# Patient Record
Sex: Female | Born: 1970 | Race: White | Hispanic: No | Marital: Single | Smoking: Never smoker
Health system: Southern US, Community
[De-identification: ages and names within clinical notes are randomized; demographics above are authoritative.]

## PROBLEM LIST (undated history)

## (undated) DIAGNOSIS — R87619 Unspecified abnormal cytological findings in specimens from cervix uteri: Secondary | ICD-10-CM

## (undated) DIAGNOSIS — L409 Psoriasis, unspecified: Secondary | ICD-10-CM

## (undated) DIAGNOSIS — T7840XA Allergy, unspecified, initial encounter: Secondary | ICD-10-CM

## (undated) DIAGNOSIS — A64 Unspecified sexually transmitted disease: Secondary | ICD-10-CM

## (undated) DIAGNOSIS — M549 Dorsalgia, unspecified: Secondary | ICD-10-CM

## (undated) DIAGNOSIS — M940 Chondrocostal junction syndrome [Tietze]: Secondary | ICD-10-CM

## (undated) DIAGNOSIS — D649 Anemia, unspecified: Secondary | ICD-10-CM

## (undated) DIAGNOSIS — F32A Depression, unspecified: Secondary | ICD-10-CM

## (undated) DIAGNOSIS — G43909 Migraine, unspecified, not intractable, without status migrainosus: Secondary | ICD-10-CM

## (undated) DIAGNOSIS — K219 Gastro-esophageal reflux disease without esophagitis: Secondary | ICD-10-CM

## (undated) DIAGNOSIS — F419 Anxiety disorder, unspecified: Secondary | ICD-10-CM

## (undated) DIAGNOSIS — M1990 Unspecified osteoarthritis, unspecified site: Secondary | ICD-10-CM

## (undated) DIAGNOSIS — IMO0002 Reserved for concepts with insufficient information to code with codable children: Secondary | ICD-10-CM

## (undated) HISTORY — DX: Depression, unspecified: F32.A

## (undated) HISTORY — DX: Unspecified abnormal cytological findings in specimens from cervix uteri: R87.619

## (undated) HISTORY — DX: Unspecified sexually transmitted disease: A64

## (undated) HISTORY — DX: Allergy, unspecified, initial encounter: T78.40XA

## (undated) HISTORY — DX: Unspecified osteoarthritis, unspecified site: M19.90

## (undated) HISTORY — DX: Dorsalgia, unspecified: M54.9

## (undated) HISTORY — PX: ADENOIDECTOMY: SUR15

## (undated) HISTORY — DX: Anemia, unspecified: D64.9

## (undated) HISTORY — DX: Migraine, unspecified, not intractable, without status migrainosus: G43.909

## (undated) HISTORY — DX: Chondrocostal junction syndrome (tietze): M94.0

## (undated) HISTORY — DX: Gastro-esophageal reflux disease without esophagitis: K21.9

## (undated) HISTORY — DX: Anxiety disorder, unspecified: F41.9

## (undated) HISTORY — DX: Reserved for concepts with insufficient information to code with codable children: IMO0002

## (undated) HISTORY — PX: WISDOM TOOTH EXTRACTION: SHX21

## (undated) HISTORY — DX: Psoriasis, unspecified: L40.9

---
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
## 1991-12-08 HISTORY — PX: OTHER SURGICAL HISTORY: SHX169

## 1998-12-07 HISTORY — PX: OVARIAN CYST REMOVAL: SHX89

## 2000-05-13 ENCOUNTER — Ambulatory Visit (HOSPITAL_COMMUNITY): Admission: RE | Admit: 2000-05-13 | Discharge: 2000-05-13 | Payer: Self-pay | Admitting: Obstetrics and Gynecology

## 2000-06-23 ENCOUNTER — Encounter: Admission: RE | Admit: 2000-06-23 | Discharge: 2000-06-23 | Payer: Self-pay | Admitting: Urology

## 2000-06-23 ENCOUNTER — Encounter: Payer: Self-pay | Admitting: Urology

## 2001-07-18 ENCOUNTER — Encounter: Admission: RE | Admit: 2001-07-18 | Discharge: 2001-07-18 | Payer: Self-pay | Admitting: Family Medicine

## 2001-07-18 ENCOUNTER — Encounter: Payer: Self-pay | Admitting: Family Medicine

## 2003-02-15 ENCOUNTER — Ambulatory Visit (HOSPITAL_BASED_OUTPATIENT_CLINIC_OR_DEPARTMENT_OTHER): Admission: RE | Admit: 2003-02-15 | Discharge: 2003-02-15 | Payer: Self-pay | Admitting: Plastic Surgery

## 2003-05-23 ENCOUNTER — Encounter: Admission: RE | Admit: 2003-05-23 | Discharge: 2003-05-23 | Payer: Self-pay | Admitting: Family Medicine

## 2003-05-23 ENCOUNTER — Encounter: Payer: Self-pay | Admitting: Family Medicine

## 2004-05-23 ENCOUNTER — Encounter: Admission: RE | Admit: 2004-05-23 | Discharge: 2004-05-23 | Payer: Self-pay | Admitting: Family Medicine

## 2005-09-09 ENCOUNTER — Emergency Department (HOSPITAL_COMMUNITY): Admission: EM | Admit: 2005-09-09 | Discharge: 2005-09-09 | Payer: Self-pay | Admitting: Family Medicine

## 2006-03-10 ENCOUNTER — Emergency Department (HOSPITAL_COMMUNITY): Admission: EM | Admit: 2006-03-10 | Discharge: 2006-03-10 | Payer: Self-pay | Admitting: Emergency Medicine

## 2007-07-28 ENCOUNTER — Emergency Department (HOSPITAL_COMMUNITY): Admission: EM | Admit: 2007-07-28 | Discharge: 2007-07-28 | Payer: Self-pay | Admitting: Emergency Medicine

## 2007-12-08 HISTORY — PX: CRYOTHERAPY: SHX1416

## 2008-03-20 ENCOUNTER — Other Ambulatory Visit: Admission: RE | Admit: 2008-03-20 | Discharge: 2008-03-20 | Payer: Self-pay | Admitting: Obstetrics & Gynecology

## 2008-03-28 ENCOUNTER — Encounter: Admission: RE | Admit: 2008-03-28 | Discharge: 2008-03-28 | Payer: Self-pay | Admitting: Obstetrics & Gynecology

## 2008-04-10 ENCOUNTER — Encounter: Admission: RE | Admit: 2008-04-10 | Discharge: 2008-04-10 | Payer: Self-pay | Admitting: Obstetrics & Gynecology

## 2009-03-22 ENCOUNTER — Other Ambulatory Visit: Admission: RE | Admit: 2009-03-22 | Discharge: 2009-03-22 | Payer: Self-pay | Admitting: Obstetrics & Gynecology

## 2011-02-27 ENCOUNTER — Encounter: Payer: Self-pay | Admitting: Oncology

## 2011-03-05 ENCOUNTER — Encounter (HOSPITAL_BASED_OUTPATIENT_CLINIC_OR_DEPARTMENT_OTHER): Payer: 59 | Admitting: Oncology

## 2011-03-05 ENCOUNTER — Other Ambulatory Visit: Payer: Self-pay | Admitting: Oncology

## 2011-03-05 DIAGNOSIS — D509 Iron deficiency anemia, unspecified: Secondary | ICD-10-CM

## 2011-03-05 LAB — CHCC SMEAR

## 2011-03-05 LAB — COMPREHENSIVE METABOLIC PANEL
ALT: 10 U/L (ref 0–35)
Alkaline Phosphatase: 55 U/L (ref 39–117)
Calcium: 8.7 mg/dL (ref 8.4–10.5)
Glucose, Bld: 101 mg/dL — ABNORMAL HIGH (ref 70–99)
Sodium: 138 mEq/L (ref 135–145)
Total Bilirubin: 0.5 mg/dL (ref 0.3–1.2)
Total Protein: 6.8 g/dL (ref 6.0–8.3)

## 2011-03-05 LAB — IRON AND TIBC
%SAT: 4 % — ABNORMAL LOW (ref 20–55)
Iron: 17 ug/dL — ABNORMAL LOW (ref 42–145)
TIBC: 407 ug/dL (ref 250–470)

## 2011-03-05 LAB — CBC WITH DIFFERENTIAL/PLATELET
BASO%: 0.6 % (ref 0.0–2.0)
EOS%: 0.9 % (ref 0.0–7.0)
MCH: 21.6 pg — ABNORMAL LOW (ref 25.1–34.0)
MCHC: 30.4 g/dL — ABNORMAL LOW (ref 31.5–36.0)
MCV: 71.1 fL — ABNORMAL LOW (ref 79.5–101.0)
MONO%: 11.8 % (ref 0.0–14.0)
RBC: 4.67 10*6/uL (ref 3.70–5.45)
RDW: 14.9 % — ABNORMAL HIGH (ref 11.2–14.5)

## 2011-03-12 ENCOUNTER — Encounter (HOSPITAL_BASED_OUTPATIENT_CLINIC_OR_DEPARTMENT_OTHER): Payer: 59 | Admitting: Oncology

## 2011-03-12 DIAGNOSIS — D509 Iron deficiency anemia, unspecified: Secondary | ICD-10-CM

## 2011-03-19 ENCOUNTER — Encounter: Payer: 59 | Admitting: Oncology

## 2011-04-14 ENCOUNTER — Other Ambulatory Visit: Payer: Self-pay | Admitting: Oncology

## 2011-04-14 ENCOUNTER — Encounter (HOSPITAL_BASED_OUTPATIENT_CLINIC_OR_DEPARTMENT_OTHER): Payer: 59 | Admitting: Oncology

## 2011-04-14 DIAGNOSIS — D509 Iron deficiency anemia, unspecified: Secondary | ICD-10-CM

## 2011-04-14 LAB — CBC WITH DIFFERENTIAL/PLATELET
Basophils Absolute: 0 10*3/uL (ref 0.0–0.1)
EOS%: 0.7 % (ref 0.0–7.0)
Eosinophils Absolute: 0 10*3/uL (ref 0.0–0.5)
HGB: 12.8 g/dL (ref 11.6–15.9)
LYMPH%: 25.7 % (ref 14.0–49.7)
MCH: 24.5 pg — ABNORMAL LOW (ref 25.1–34.0)
MCV: 74.8 fL — ABNORMAL LOW (ref 79.5–101.0)
MONO%: 10.6 % (ref 0.0–14.0)
NEUT#: 3.9 10*3/uL (ref 1.5–6.5)
Platelets: 296 10*3/uL (ref 145–400)
RDW: 22.1 % — ABNORMAL HIGH (ref 11.2–14.5)

## 2011-04-14 LAB — IRON AND TIBC: %SAT: 10 % — ABNORMAL LOW (ref 20–55)

## 2011-04-16 ENCOUNTER — Encounter (HOSPITAL_BASED_OUTPATIENT_CLINIC_OR_DEPARTMENT_OTHER): Payer: 59 | Admitting: Oncology

## 2011-04-16 DIAGNOSIS — D509 Iron deficiency anemia, unspecified: Secondary | ICD-10-CM

## 2011-04-20 ENCOUNTER — Encounter (HOSPITAL_BASED_OUTPATIENT_CLINIC_OR_DEPARTMENT_OTHER): Payer: 59 | Admitting: Oncology

## 2011-04-20 DIAGNOSIS — D509 Iron deficiency anemia, unspecified: Secondary | ICD-10-CM

## 2011-04-24 NOTE — Op Note (Signed)
[facility]  Patient:    Sandy [name], Sandy [name]                        MRN: [id number] Adm. Date:  [id number] Disc. Date: [id number] Attending:  Lendon Colonel                           Operative Report  PREOPERATIVE DIAGNOSIS:       Pelvic pain, history of dermoid cyst of ovary                               status post laparotomy with removal of dermoid                               cyst. 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 OPERATION:                    Laparoscopy with obliteration of cyst of right                               ovary and focal area of endometriosis of right                               ovary, lysis of adhesions of right ovary and                               lysis of adhesions of left pelvic side wall. SURGEON:                      Katherine Roan, M.D.  ASSISTANT:  ANESTHESIA:  ESTIMATED BLOOD LOSS:  INDICATIONS:  DESCRIPTION OF PROCEDURE:     The patient was placed in the lithotomy position, prepped and draped in the usual fashion. Her bladder was emptied and the transverse incision was made in the umbilicus.  The abdomen was distended with carbon dioxide.  Aspiration infusion was done and the gas was insufflated under low pressure to about 3 liters of CO2.  [name] 10-11 trocar was inserted into the abdomen and the scope was inserted.   There were no pelvic adhesions.  The right ovary was attached to the back side of the uterus with [name] few adhesions. 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 There was no evidence of endometriosis on the right ovary. The ovarian cyst was visualized.  I then did [name] second puncture and manipulated the right ovary, lysed the adhesions just before the utero-ovarian ligament and drained the cyst of the right  ovary in the process and the small endometrioma on the surface of the right ovary.  This was drained and cauterized.  The congenital adhesions on the left were likewise handled.  This freed up the infundibulopelvic ligament quite nicely.  The uterus was normal size and shape.  The upper abdomen was without adhesions. Liver appeared to be normal.  The patient tolerated this procedure well. The gas was evacuated. Skin was closed with Dexon and infiltrated with 0.5% Marcaine with epinephrine.  The procedure was video taped. DD:  05/13/00 TD:  05/17/00 Job: [id number]

## 2011-04-24 NOTE — Op Note (Signed)
NAME:  Sandy [name], Sandy [name]                           ACCOUNT NO.:  [id number]   MEDICAL RECORD NO.:  [id number]                   PATIENT TYPE:  AMB   LOCATION:  DSC                                  FACILITY:  [facility]   PHYSICIAN:  Alfredia Ferguson, M.D.               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   POSTOPERATIVE DIAGNOSIS:  [name] 1.2 cm biopsy proven dysplastic nevus left upper  mid back.   OPERATION PERFORMED:  Excision of dysplastic nevus, biopsy site left upper  mid back.   SURGEON:  Alfredia Ferguson, M.D.   ANESTHESIA:  2% Xylocaine 1:100,000 epinephrine.   INDICATIONS FOR PROCEDURE:  The patient is [name] 40 year old woman who has had  excision of [name] dysplastic nevus in [name] single site with positive margins times  two.  Dr. Mayford Knife has requested [name] wider excision of the lesion to clear the  margins.  The patient understands she will be trading what she has for [name]  permanent and potentially unsightly scar.  The biopsy site is relatively  keloid in its appearance and it is likely that she may have [name] very similar  looking keloid after excision of this biopsy site.  In spite of the risks of  unsightly scarring, the patient wishes to proceed with the surgery in order  to clear the margins.   DESCRIPTION OF PROCEDURE:  Skin markers were placed in elliptical fashion,  transversely oriented with approximately 3 mm margins around the edges of  the scar. Local anesthesia was infiltrated and the area was prepped with  Betadine and draped with sterile drapes.  After waiting approximately 10  minutes, an elliptical excision of this lesion down to the level of the  subcutaneous tissue was carried out.  The specimen was passed off for  pathology.  Wound edges were undermined for [name] distance of 0.5 cm in all  directions.   Hemostasis was accomplished using pressure.  The wound was  closed by approximating the dermis using interrupted 4-0 Monocryl sutures.  Skin was united using [name] running 3-0 Prolene subcuticular.  Steri-Strips were  applied followed by [name] light dressing.  The patient was discharged to home in  satisfactory condition.                                                Alfredia Ferguson, M.D.    WBB/MEDQ  D:  02/15/2003  T:  02/15/2003  Job:  [id number]   cc:   Dollene Cleveland, M.D.  [address] [place]  [place] [address]  Fax: [phone]

## 2011-04-27 ENCOUNTER — Other Ambulatory Visit: Payer: Self-pay | Admitting: Family Medicine

## 2011-04-27 ENCOUNTER — Other Ambulatory Visit: Payer: 59

## 2011-04-27 ENCOUNTER — Ambulatory Visit
Admission: RE | Admit: 2011-04-27 | Discharge: 2011-04-27 | Disposition: A | Discharge status: Home / Self Care | Payer: 59 | Source: Ambulatory Visit | Attending: Family Medicine | Admitting: Family Medicine

## 2011-04-27 DIAGNOSIS — R52 Pain, unspecified: Secondary | ICD-10-CM

## 2011-04-27 DIAGNOSIS — R609 Edema, unspecified: Secondary | ICD-10-CM

## 2011-07-16 ENCOUNTER — Other Ambulatory Visit: Payer: Self-pay | Admitting: Medical

## 2011-07-16 ENCOUNTER — Encounter (HOSPITAL_BASED_OUTPATIENT_CLINIC_OR_DEPARTMENT_OTHER): Payer: 59 | Admitting: Oncology

## 2011-07-16 DIAGNOSIS — D509 Iron deficiency anemia, unspecified: Secondary | ICD-10-CM

## 2011-07-16 LAB — COMPREHENSIVE METABOLIC PANEL
ALT: 18 U/L (ref 0–35)
BUN: 14 mg/dL (ref 6–23)
CO2: 25 mEq/L (ref 19–32)
Calcium: 9.7 mg/dL (ref 8.4–10.5)
Chloride: 103 mEq/L (ref 96–112)
Creatinine, Ser: 0.67 mg/dL (ref 0.50–1.10)
Glucose, Bld: 91 mg/dL (ref 70–99)

## 2011-07-16 LAB — IRON AND TIBC
%SAT: 33 % (ref 20–55)
Iron: 113 ug/dL (ref 42–145)
TIBC: 345 ug/dL (ref 250–470)
UIBC: 232 ug/dL

## 2011-07-16 LAB — CBC WITH DIFFERENTIAL/PLATELET
BASO%: 0.5 % (ref 0.0–2.0)
Basophils Absolute: 0 10e3/uL (ref 0.0–0.1)
EOS%: 2.3 % (ref 0.0–7.0)
Eosinophils Absolute: 0.2 10e3/uL (ref 0.0–0.5)
HCT: 40.3 % (ref 34.8–46.6)
HGB: 13.7 g/dL (ref 11.6–15.9)
LYMPH%: 26.4 % (ref 14.0–49.7)
MCH: 28 pg (ref 25.1–34.0)
MCHC: 34 g/dL (ref 31.5–36.0)
MCV: 82.4 fL (ref 79.5–101.0)
MONO#: 0.8 10e3/uL (ref 0.1–0.9)
MONO%: 10 % (ref 0.0–14.0)
NEUT#: 4.6 10e3/uL (ref 1.5–6.5)
NEUT%: 60.8 % (ref 38.4–76.8)
Platelets: 305 10e3/uL (ref 145–400)
RBC: 4.89 10e6/uL (ref 3.70–5.45)
RDW: 13.5 % (ref 11.2–14.5)
WBC: 7.6 10e3/uL (ref 3.9–10.3)
lymph#: 2 10e3/uL (ref 0.9–3.3)

## 2011-07-16 LAB — FERRITIN: Ferritin: 28 ng/mL (ref 10–291)

## 2011-12-05 ENCOUNTER — Telehealth: Payer: Self-pay | Admitting: Oncology

## 2011-12-05 NOTE — Telephone Encounter (Signed)
Lvm advising appt 12/15/11 @ 3.30pm.

## 2011-12-12 ENCOUNTER — Encounter: Payer: Self-pay | Admitting: *Deleted

## 2011-12-15 ENCOUNTER — Other Ambulatory Visit: Payer: Self-pay | Admitting: Oncology

## 2011-12-15 ENCOUNTER — Ambulatory Visit (HOSPITAL_BASED_OUTPATIENT_CLINIC_OR_DEPARTMENT_OTHER): Payer: 59 | Admitting: Oncology

## 2011-12-15 ENCOUNTER — Other Ambulatory Visit: Payer: 59

## 2011-12-15 ENCOUNTER — Telehealth: Payer: Self-pay | Admitting: Oncology

## 2011-12-15 VITALS — BP 126/84 | HR 93 | Temp 97.3°F | Ht 70.5 in | Wt 233.5 lb

## 2011-12-15 DIAGNOSIS — D649 Anemia, unspecified: Secondary | ICD-10-CM

## 2011-12-15 DIAGNOSIS — D509 Iron deficiency anemia, unspecified: Secondary | ICD-10-CM

## 2011-12-15 DIAGNOSIS — N920 Excessive and frequent menstruation with regular cycle: Secondary | ICD-10-CM

## 2011-12-15 LAB — CBC WITH DIFFERENTIAL/PLATELET
Basophils Absolute: 0.1 10*3/uL (ref 0.0–0.1)
EOS%: 0.9 % (ref 0.0–7.0)
HGB: 13.6 g/dL (ref 11.6–15.9)
MCH: 28.7 pg (ref 25.1–34.0)
MCHC: 34 g/dL (ref 31.5–36.0)
MCV: 84.6 fL (ref 79.5–101.0)
MONO%: 10.7 % (ref 0.0–14.0)
NEUT%: 63.5 % (ref 38.4–76.8)
RDW: 12.8 % (ref 11.2–14.5)

## 2011-12-15 LAB — IRON AND TIBC: Iron: 106 ug/dL (ref 42–145)

## 2011-12-15 NOTE — Telephone Encounter (Signed)
APPT MADE FOR 7/16,PRINTED FOR PT  [facility]

## 2011-12-15 NOTE — Progress Notes (Signed)
Hematology and Oncology Follow Up Visit  Sandy [name] [id number] [birth year] 41 y.o. 12/15/2011 3:55 PM  CC: Bryan Lemma. Manus Gunning, M.D.    Principle Diagnosis: This is a 41 year old female with iron-deficiency anemia diagnosed back in March 2012.    Prior Therapy: Patient status post total iron replacement with Feraheme in April 2012.  Current therapy: Surveillance only.  Interim History: [name] presents today for a followup visit.  She is a 41 year old woman with iron-deficiency anemia.  She had presented initially in March 2012 with a hemoglobin of 10.  Her ferritin was 5, iron level of 17 and an MCV of 71.  After 1 g of Feraheme, her hemoglobin had normalized.  It was 12.8, subsequently 13.7, and now is 13.6. She reports continued improvement in her activity level and her energy.  She reports that her overall menorrhagia has improved.  She really has regular menstrual cycles at this time.  She also stopped blood donations at this time.  Medications: I have reviewed the patient's current medications. Current outpatient prescriptions:ergocalciferol (VITAMIN D2) 50000 UNITS capsule, Take 50,000 Units by mouth once a week. , Disp: , Rfl: ;  cyanocobalamin 500 MCG tablet, Take 500 mcg by mouth daily.  , Disp: , Rfl:   Allergies:  Allergies  Allergen Reactions  . Codeine   . Sulfa Antibiotics     Past Medical History, Surgical history, Social history, and Family History were reviewed and updated.  Review of Systems: Constitutional:  Negative for fever, chills, night sweats, anorexia, weight loss, pain. Cardiovascular: no chest pain or dyspnea on exertion Respiratory: no cough, shortness of breath, or wheezing Neurological: no TIA or stroke symptoms Dermatological: negative ENT: negative Skin: Negative. Gastrointestinal: no abdominal pain, change in bowel habits, or black or bloody stools Genito-Urinary: no dysuria, trouble voiding, or hematuria Hematological and Lymphatic:  negative Breast: negative Musculoskeletal: negative Remaining ROS negative. Physical Exam: Blood pressure 126/84, pulse 93, temperature 97.3 F (36.3 C), temperature source Oral, height 5' 10.5" (1.791 m), weight 233 lb 8 oz (105.915 kg). ECOG: 0 General appearance: alert Head: Normocephalic, without obvious abnormality, atraumatic Neck: no adenopathy, no carotid bruit, no JVD, supple, symmetrical, trachea midline and thyroid not enlarged, symmetric, no tenderness/mass/nodules Lymph nodes: Cervical, supraclavicular, and axillary nodes normal. Heart:regular rate and rhythm, S1, S2 normal, no murmur, click, rub or gallop Lung:chest clear, no wheezing, rales, normal symmetric air entry Abdomin: soft, non-tender, without masses or organomegaly EXT:no erythema, induration, or nodules   Lab Results: Lab Results  Component Value Date   WBC 7.6 12/15/2011   HGB 13.6 12/15/2011   HCT 40.2 12/15/2011   MCV 84.6 12/15/2011   PLT 287 12/15/2011     Chemistry      Component Value Date/Time   NA 137 07/16/2011 1126   NA 137 07/16/2011 1126   K 4.2 07/16/2011 1126   K 4.2 07/16/2011 1126   CL 103 07/16/2011 1126   CL 103 07/16/2011 1126   CO2 25 07/16/2011 1126   CO2 25 07/16/2011 1126   BUN 14 07/16/2011 1126   BUN 14 07/16/2011 1126   CREATININE 0.67 07/16/2011 1126   CREATININE 0.67 07/16/2011 1126      Component Value Date/Time   CALCIUM 9.7 07/16/2011 1126   CALCIUM 9.7 07/16/2011 1126   ALKPHOS 63 07/16/2011 1126   ALKPHOS 63 07/16/2011 1126   AST 20 07/16/2011 1126   AST 20 07/16/2011 1126   ALT 18 07/16/2011 1126   ALT 18 07/16/2011 1126  BILITOT 0.5 07/16/2011 1126   BILITOT 0.5 07/16/2011 1126        Impression and Plan:  This is a pleasant 41 year old woman with the following issues: 1. Iron-deficiency anemia.  Her hemoglobin has normalized and her MCV is normal.  I am repeating her iron studies for followup purposes.  The plan would be at this point to continue to monitor her every 6 months.  I have instructed  her to take p.o. iron 1 daily dose at the time being. 2. Menorrhagia.  Seems to be improved at this point.  She will follow up with her OB/GYN      [facility], MD 1/8/20133:55 PM

## 2012-06-21 ENCOUNTER — Telehealth: Payer: Self-pay | Admitting: Oncology

## 2012-06-21 ENCOUNTER — Ambulatory Visit (HOSPITAL_BASED_OUTPATIENT_CLINIC_OR_DEPARTMENT_OTHER): Payer: 59 | Admitting: Oncology

## 2012-06-21 ENCOUNTER — Other Ambulatory Visit (HOSPITAL_BASED_OUTPATIENT_CLINIC_OR_DEPARTMENT_OTHER): Payer: 59 | Admitting: Lab

## 2012-06-21 VITALS — BP 121/84 | HR 93 | Temp 97.5°F | Ht 70.5 in | Wt 234.8 lb

## 2012-06-21 DIAGNOSIS — D649 Anemia, unspecified: Secondary | ICD-10-CM

## 2012-06-21 DIAGNOSIS — D509 Iron deficiency anemia, unspecified: Secondary | ICD-10-CM

## 2012-06-21 DIAGNOSIS — N920 Excessive and frequent menstruation with regular cycle: Secondary | ICD-10-CM

## 2012-06-21 LAB — CBC WITH DIFFERENTIAL/PLATELET
Basophils Absolute: 0.1 10*3/uL (ref 0.0–0.1)
Eosinophils Absolute: 0.1 10*3/uL (ref 0.0–0.5)
HCT: 39.5 % (ref 34.8–46.6)
LYMPH%: 27.9 % (ref 14.0–49.7)
MONO#: 0.9 10*3/uL (ref 0.1–0.9)
NEUT#: 4.7 10*3/uL (ref 1.5–6.5)
NEUT%: 59 % (ref 38.4–76.8)
Platelets: 298 10*3/uL (ref 145–400)
WBC: 8 10*3/uL (ref 3.9–10.3)

## 2012-06-21 LAB — FERRITIN: Ferritin: 18 ng/mL (ref 10–291)

## 2012-06-21 LAB — IRON AND TIBC: %SAT: 39 % (ref 20–55)

## 2012-06-21 NOTE — Telephone Encounter (Signed)
gv pt appt schedule for July 2014.  °

## 2012-06-21 NOTE — Progress Notes (Signed)
Hematology and Oncology Follow Up Visit  Sandy [name] [id number] [birth year] 41 y.o. 06/21/2012 4:31 PM  CC: Bryan Lemma. Manus Gunning, M.D.    Principle Diagnosis: This is a 41 year old female with iron-deficiency anemia diagnosed back in March 2012.  Prior Therapy: Patient status post total iron replacement with Feraheme in April 2012.  Current therapy: Surveillance only.  Interim History: [name] presents today for a followup visit.  She is a 41 year old woman with iron-deficiency anemia.  She had presented initially in March 2012 with a hemoglobin of 10.  Her ferritin was 5, iron level of 17 and an MCV of 71.  After 1 g of Feraheme, her hemoglobin had normalized.  She reports continued improvement in her activity level and her energy.  She reports that her overall menorrhagia has improved.  She really has regular menstrual cycles at this time.  She also stopped blood donations at this time. She reports fatigue at times, but much less than before.   Medications: I have reviewed the patient's current medications. Current outpatient prescriptions:ergocalciferol (VITAMIN D2) 50000 UNITS capsule, Take 50,000 Units by mouth once a week. , Disp: , Rfl:   Allergies:  Allergies  Allergen Reactions  . Codeine   . Sulfa Antibiotics     Past Medical History, Surgical history, Social history, and Family History were reviewed and updated.  Review of Systems: Constitutional:  Negative for fever, chills, night sweats, anorexia, weight loss, pain. Cardiovascular: no chest pain or dyspnea on exertion Respiratory: no cough, shortness of breath, or wheezing Neurological: no TIA or stroke symptoms Dermatological: negative ENT: negative Skin: Negative. Gastrointestinal: no abdominal pain, change in bowel habits, or black or bloody stools Genito-Urinary: no dysuria, trouble voiding, or hematuria Hematological and Lymphatic: negative Breast: negative Musculoskeletal: negative Remaining ROS  negative. Physical Exam: Blood pressure 121/84, pulse 93, temperature 97.5 F (36.4 C), temperature source Oral, height 5' 10.5" (1.791 m), weight 234 lb 12.8 oz (106.505 kg). ECOG: 0 General appearance: alert Head: Normocephalic, without obvious abnormality, atraumatic Neck: no adenopathy, no carotid bruit, no JVD, supple, symmetrical, trachea midline and thyroid not enlarged, symmetric, no tenderness/mass/nodules Lymph nodes: Cervical, supraclavicular, and axillary nodes normal. Heart:regular rate and rhythm, S1, S2 normal, no murmur, click, rub or gallop Lung:chest clear, no wheezing, rales, normal symmetric air entry Abdomin: soft, non-tender, without masses or organomegaly EXT:no erythema, induration, or nodules   Lab Results: Lab Results  Component Value Date   WBC 8.0 06/21/2012   HGB 13.3 06/21/2012   HCT 39.5 06/21/2012   MCV 85.6 06/21/2012   PLT 298 06/21/2012            Impression and Plan:  This is a pleasant 41 year old woman with the following issues: 1. Iron-deficiency anemia.  Her hemoglobin has normalized and her MCV is normal.  I am repeating her iron studies for followup purposes.  The plan would be at this point to continue to monitor her every 12 months with instructions to call sooner if needed. 2. Menorrhagia.  Seems to be improved at this point.  She will follow up with her OB/GYN    [facility], MD 7/16/20134:31 PM

## 2012-09-13 ENCOUNTER — Other Ambulatory Visit: Payer: Self-pay | Admitting: Obstetrics & Gynecology

## 2012-09-13 DIAGNOSIS — Z1231 Encounter for screening mammogram for malignant neoplasm of breast: Secondary | ICD-10-CM

## 2012-09-27 ENCOUNTER — Ambulatory Visit
Admission: RE | Admit: 2012-09-27 | Discharge: 2012-09-27 | Disposition: A | Discharge status: Home / Self Care | Payer: 59 | Source: Ambulatory Visit | Attending: Obstetrics & Gynecology | Admitting: Obstetrics & Gynecology

## 2012-09-27 DIAGNOSIS — Z1231 Encounter for screening mammogram for malignant neoplasm of breast: Secondary | ICD-10-CM

## 2013-03-16 ENCOUNTER — Other Ambulatory Visit: Payer: Self-pay | Admitting: *Deleted

## 2013-03-16 ENCOUNTER — Telehealth: Payer: Self-pay | Admitting: *Deleted

## 2013-03-16 DIAGNOSIS — D649 Anemia, unspecified: Secondary | ICD-10-CM

## 2013-03-16 NOTE — Telephone Encounter (Signed)
Patient calling to c/o feeling tired and asked if she could come in for a cbc? Yes, per dr Clelia Croft. pof to schedulers, lab order entered.

## 2013-03-17 ENCOUNTER — Telehealth: Payer: Self-pay | Admitting: Oncology

## 2013-03-20 ENCOUNTER — Other Ambulatory Visit: Payer: Self-pay | Admitting: Obstetrics & Gynecology

## 2013-03-20 ENCOUNTER — Telehealth: Payer: Self-pay | Admitting: Obstetrics & Gynecology

## 2013-03-20 DIAGNOSIS — Z0289 Encounter for other administrative examinations: Secondary | ICD-10-CM

## 2013-03-20 MED ORDER — VALACYCLOVIR HCL 500 MG PO TABS: 500.0000 mg | ORAL_TABLET | Freq: Two times a day (BID) | ORAL | Status: DC

## 2013-03-20 NOTE — Telephone Encounter (Signed)
Pt paid lost Rx fee and would like the generic for valtrex [facility] [address] at [phone].

## 2013-03-20 NOTE — Telephone Encounter (Signed)
Valtrex #90/3 rf's sent thr[facility] to [facility] [address]; pt is aware. Aex 06/18/13

## 2013-05-14 ENCOUNTER — Other Ambulatory Visit: Payer: Self-pay | Admitting: Obstetrics & Gynecology

## 2013-05-15 ENCOUNTER — Telehealth: Payer: Self-pay | Admitting: *Deleted

## 2013-05-15 MED ORDER — VALACYCLOVIR HCL 500 MG PO TABS: 500.0000 mg | ORAL_TABLET | Freq: Two times a day (BID) | ORAL | Status: DC

## 2013-05-15 NOTE — Telephone Encounter (Signed)
Rx for Valtrex 500mg 1 po BID #60/0 refills called [facility]. Pt is aware. Last aex on 05/12/2012. Next aex on 06/08/2013 with Dr. Miller.  

## 2013-05-15 NOTE — Telephone Encounter (Signed)
Rx for Valtrex 500mg  1 po BID #60/0 refills called into [facility]. Pt is aware. Last aex on 05/12/2012. Next aex on 06/08/2013 with Dr. Hyacinth Meeker.

## 2013-06-02 ENCOUNTER — Encounter: Payer: Self-pay | Admitting: *Deleted

## 2013-06-08 ENCOUNTER — Encounter: Payer: Self-pay | Admitting: Obstetrics & Gynecology

## 2013-06-08 ENCOUNTER — Ambulatory Visit (INDEPENDENT_AMBULATORY_CARE_PROVIDER_SITE_OTHER): Payer: 59 | Admitting: Obstetrics & Gynecology

## 2013-06-08 VITALS — BP 130/80 | HR 68 | Resp 16 | Ht 70.75 in | Wt 239.6 lb

## 2013-06-08 DIAGNOSIS — Z01419 Encounter for gynecological examination (general) (routine) without abnormal findings: Secondary | ICD-10-CM

## 2013-06-08 DIAGNOSIS — Z0000 Encounter for general adult medical examination without abnormal findings: Secondary | ICD-10-CM

## 2013-06-08 DIAGNOSIS — Z124 Encounter for screening for malignant neoplasm of cervix: Secondary | ICD-10-CM

## 2013-06-08 DIAGNOSIS — M549 Dorsalgia, unspecified: Secondary | ICD-10-CM

## 2013-06-08 LAB — POCT URINALYSIS DIPSTICK
Glucose, UA: NEGATIVE
Nitrite, UA: NEGATIVE
Urobilinogen, UA: NEGATIVE

## 2013-06-08 MED ORDER — VALACYCLOVIR HCL 500 MG PO TABS: ORAL_TABLET | ORAL | Status: DC

## 2013-06-08 MED ORDER — NORETHIN-ETH ESTRAD-FE BIPHAS 1 MG-10 MCG / 10 MCG PO TABS: 1.0000 | ORAL_TABLET | Freq: Every day | ORAL | Status: DC

## 2013-06-08 NOTE — Progress Notes (Signed)
42 y.o. G0P0 SingleCaucasianF here for annual exam.  Had episode of hives last week.  Started in her head and worked all the way to her toes.  Reports there is "a lot going on at work" but this isn't all that new.    Cycles are regular.  Flow lasts 6-7 days, flow is heavy 3-4.  First 2 are the worse days.    Did blood work with Dr. Manus Gunning.  Cholesterol was fine, per patient.  Did not have Vit D checked.  Has been experiencing mid back pain for about six months.  She says "I know this isn't your area but...".  Hurts with extended sitting or standing, twisting, lifting.  Ready to see someone.  No foot, leg numbness.  Has been to [place] [facility] in past for knee issue.  Cannot remember MD name.  Patient's last menstrual period was 05/27/2013.          Sexually active: yes The current method of family planning is none.    Exercising: yes  walking Smoker:  no  Health Maintenance: Pap:  05/12/12 WNL/negative HR HPV History of abnormal Pap:  yes MMG:  09/27/12 normal Colonoscopy:  2009 repeat age 64 BMD:   heel test 2004 at work  TDaP:  11/20/09 Screening Labs: PCP, Hb today: PCP, Urine today: RBC-trace   reports that she has never smoked. She has never used smokeless tobacco. She reports that she drinks about 1.0 ounces of alcohol per week. She reports that she does not use illicit drugs.  Past Medical History  Diagnosis Date  . Anemia   . Psoriasis   . STD (sexually transmitted disease)     HSV II  . Abnormal Pap smear     H/O CIN II  . Migraines   . Back pain   . Costochondritis     Past Surgical History  Procedure Laterality Date  . Ovarian cyst removal    . Colonoscopy    . Cryotherapy  2009    of cx  . Dermoid cyst  excision      left ? ovary/appendectomy  . Urethral stretching      Current Outpatient Prescriptions  Medication Sig Dispense Refill  . valACYclovir (VALTREX) 500 MG tablet Take 1 tablet (500 mg total) by mouth 2 (two) times daily.  60 tablet  0  .  Cyanocobalamin (VITAMIN B 12 PO) Take by mouth. Stopped x 1 week-hives and itching      . ergocalciferol (VITAMIN D2) 50000 UNITS capsule Take 50,000 Units by mouth once a week.       . Multiple Vitamins-Calcium (ONE-A-DAY WOMENS PO) Take by mouth daily. Stopped x 1 week-hives and itching       No current facility-administered medications for this visit.    Family History  Problem Relation Age of Onset  . Thyroid disease Mother     and maternal side  . Diabetes Father   . Throat cancer Maternal Grandfather   . Cervical cancer Paternal Grandmother   . Hypertension Mother   . Heart attack Maternal Uncle   . Heart Problems Mother     heart arythmia  . Thrombocytopenia Sister     ITP    ROS:  Pertinent items are noted in HPI.  Otherwise, a comprehensive ROS was negative.  Exam:   BP 130/80  Pulse 68  Resp 16  Ht 5' 10.75" (1.797 m)  Wt 239 lb 9.6 oz (108.682 kg)  BMI 33.66 kg/m2  LMP 05/27/2013  Weight  change: +3  Height: 5' 10.75" (179.7 cm)  Ht Readings from Last 3 Encounters:  06/08/13 5' 10.75" (1.797 m)  06/21/12 5' 10.5" (1.791 m)  12/15/11 5' 10.5" (1.791 m)    General appearance: alert, cooperative and appears stated age Head: Normocephalic, without obvious abnormality, atraumatic Neck: no adenopathy, supple, symmetrical, trachea midline and thyroid normal to inspection and palpation Lungs: clear to auscultation bilaterally Breasts: normal appearance, no masses or tenderness Heart: regular rate and rhythm Abdomen: soft, non-tender; bowel sounds normal; no masses,  no organomegaly Extremities: extremities normal, atraumatic, no cyanosis or edema Skin: Skin color, texture, turgor normal. No rashes or lesions Lymph nodes: Cervical, supraclavicular, and axillary nodes normal. No abnormal inguinal nodes palpated Neurologic: Grossly normal   Pelvic: External genitalia:  no lesions              Urethra:  normal appearing urethra with no masses, tenderness or  lesions              Bartholins and Skenes: normal                 Vagina: normal appearing vagina with normal color and discharge, no lesions              Cervix: no lesions              Pap taken: yes Bimanual Exam:  Uterus:  normal size, contour, position, consistency, mobility, non-tender              Adnexa: normal adnexa and no mass, fullness, tenderness               Rectovaginal: Confirms               Anus:  normal sphincter tone, no lesions  A:  Well Woman with normal exam H/O HSV II Low Vit D Psoriasis Menorrhagia Midback pain  P:   Mammogram yearly. pap smear today.  H/O CIN II 2009. Takes Valtex 1 gram daily for suppression, when she takes it. Referral to Ortho Trial of Lo Loestrin daily.  Samples x 3 months given.  Risks of HA, nausea, increased BP, DVT, PE all discussed with patient.  She voiced clear understanding of above. return annually or prn  An After Visit Summary was printed and given to the patient.

## 2013-06-08 NOTE — Patient Instructions (Addendum)

## 2013-06-09 LAB — VITAMIN D 25 HYDROXY (VIT D DEFICIENCY, FRACTURES): Vit D, 25-Hydroxy: 36 ng/mL (ref 30–89)

## 2013-06-13 LAB — IPS PAP SMEAR ONLY

## 2013-06-19 ENCOUNTER — Telehealth: Payer: Self-pay | Admitting: *Deleted

## 2013-06-19 NOTE — Telephone Encounter (Signed)
Patient aware of referral appointment to [facility] with Dr. Yevette Edwards for mild back pain. Scheduled Saturday July 19th @ 12 noon and to arrive at 11:15am.

## 2013-06-21 ENCOUNTER — Other Ambulatory Visit (HOSPITAL_BASED_OUTPATIENT_CLINIC_OR_DEPARTMENT_OTHER): Payer: 59 | Admitting: Lab

## 2013-06-21 ENCOUNTER — Ambulatory Visit (HOSPITAL_BASED_OUTPATIENT_CLINIC_OR_DEPARTMENT_OTHER): Payer: 59 | Admitting: Oncology

## 2013-06-21 ENCOUNTER — Telehealth: Payer: Self-pay | Admitting: Oncology

## 2013-06-21 VITALS — BP 119/79 | HR 95 | Temp 97.5°F | Resp 18 | Ht 70.75 in | Wt 247.0 lb

## 2013-06-21 DIAGNOSIS — D649 Anemia, unspecified: Secondary | ICD-10-CM

## 2013-06-21 LAB — CBC WITH DIFFERENTIAL/PLATELET
BASO%: 0.7 % (ref 0.0–2.0)
EOS%: 1.5 % (ref 0.0–7.0)
HCT: 35.4 % (ref 34.8–46.6)
MCH: 26.8 pg (ref 25.1–34.0)
MCHC: 33.2 g/dL (ref 31.5–36.0)
NEUT%: 65.1 % (ref 38.4–76.8)
RDW: 12.9 % (ref 11.2–14.5)
lymph#: 2.1 10*3/uL (ref 0.9–3.3)

## 2013-06-21 LAB — IRON AND TIBC CHCC: TIBC: 383 ug/dL (ref 236–444)

## 2013-06-21 NOTE — Progress Notes (Signed)
Hematology and Oncology Follow Up Visit  Sandy [name] [id number] 1971/04/02 42 y.o. 06/21/2013 3:15 PM  CC: Bryan Lemma. Manus Gunning, M.D.    Principle Diagnosis: This is a 42 year old female with iron-deficiency anemia diagnosed back in March 2012.  Prior Therapy: Patient status post total iron replacement with Feraheme in April 2012.  Current therapy: Surveillance only.  Interim History: [name] presents today for a followup visit.  She is a 42 year old woman with iron-deficiency anemia.  She had presented initially in March 2012 with a hemoglobin of 10.  Her ferritin was 5, iron level of 17 and an MCV of 71.  After 1 g of Feraheme, her hemoglobin had normalized.  She reports continued improvement in her activity level and her energy.  She reports that her overall menorrhagia has improved.  She really has regular menstrual cycles at this time.  She  She reports fatigue at times, but much less than before. No other bleeding episodes noted.   Medications: I have reviewed the patient's current medications. Current Outpatient Prescriptions  Medication Sig Dispense Refill  . Cyanocobalamin (VITAMIN B 12 PO) Take by mouth. Stopped x 1 week-hives and itching      . ergocalciferol (VITAMIN D2) 50000 UNITS capsule Take 50,000 Units by mouth once a week.       . Multiple Vitamins-Calcium (ONE-A-DAY WOMENS PO) Take by mouth daily. Stopped x 1 week-hives and itching      . Norethindrone-Ethinyl Estradiol-Fe Biphas (LO LOESTRIN FE) 1 MG-10 MCG / 10 MCG tablet Take 1 tablet by mouth daily.  3 Package  0  . valACYclovir (VALTREX) 500 MG tablet One tablet daily  90 tablet  4   No current facility-administered medications for this visit.    Allergies:  Allergies  Allergen Reactions  . Codeine   . Sulfa Antibiotics     Past Medical History, Surgical history, Social history, and Family History were reviewed and updated.  Review of Systems: Constitutional:  Negative for fever, chills, night sweats,  anorexia, weight loss, pain. Cardiovascular: no chest pain or dyspnea on exertion Respiratory: no cough, shortness of breath, or wheezing Neurological: no TIA or stroke symptoms Dermatological: negative ENT: negative Skin: Negative. Gastrointestinal: no abdominal pain, change in bowel habits, or black or bloody stools Genito-Urinary: no dysuria, trouble voiding, or hematuria Hematological and Lymphatic: negative Breast: negative Musculoskeletal: negative Remaining ROS negative. Physical Exam: Blood pressure 119/79, pulse 95, temperature 97.5 F (36.4 C), temperature source Oral, resp. rate 18, height 5' 10.75" (1.797 m), weight 247 lb (112.038 kg), last menstrual period 05/27/2013. ECOG: 0 General appearance: alert Head: Normocephalic, without obvious abnormality, atraumatic Neck: no adenopathy, no carotid bruit, no JVD, supple, symmetrical, trachea midline and thyroid not enlarged, symmetric, no tenderness/mass/nodules Lymph nodes: Cervical, supraclavicular, and axillary nodes normal. Heart:regular rate and rhythm, S1, S2 normal, no murmur, click, rub or gallop Lung:chest clear, no wheezing, rales, normal symmetric air entry Abdomin: soft, non-tender, without masses or organomegaly EXT:no erythema, induration, or nodules   Lab Results: Lab Results  Component Value Date   WBC 9.2 06/21/2013   HGB 11.7 06/21/2013   HCT 35.4 06/21/2013   MCV 80.7 06/21/2013   PLT 306 06/21/2013            Impression and Plan:  This is a pleasant 42 year old woman with the following issues: 1. Iron-deficiency anemia.  Her hemoglobin has normalized and her MCV is normal.  I am repeating her iron studies for followup purposes.  The plan would be at  this point to continue to monitor her every 6 months with instructions to call sooner if needed. 2. Menorrhagia.  Seems to be improved at this point.  She will follow up with her OB/GYN    [facility], MD 7/16/20143:15 PM

## 2013-06-21 NOTE — Telephone Encounter (Signed)
gv and printed appt sched adn avs forlpt.Marland KitchenMarland Kitchen

## 2013-07-21 ENCOUNTER — Other Ambulatory Visit: Payer: 59 | Admitting: Lab

## 2013-07-21 ENCOUNTER — Ambulatory Visit: Payer: 59 | Admitting: Oncology

## 2013-07-29 ENCOUNTER — Emergency Department (HOSPITAL_COMMUNITY)
Admission: EM | Admit: 2013-07-29 | Discharge: 2013-07-29 | Disposition: A | Discharge status: Home / Self Care | Payer: 59 | Source: Home / Self Care

## 2013-07-29 ENCOUNTER — Emergency Department (INDEPENDENT_AMBULATORY_CARE_PROVIDER_SITE_OTHER): Payer: 59

## 2013-07-29 ENCOUNTER — Encounter (HOSPITAL_COMMUNITY): Payer: Self-pay | Admitting: *Deleted

## 2013-07-29 DIAGNOSIS — S022XXA Fracture of nasal bones, initial encounter for closed fracture: Secondary | ICD-10-CM

## 2013-07-29 DIAGNOSIS — S022XXB Fracture of nasal bones, initial encounter for open fracture: Secondary | ICD-10-CM

## 2013-07-29 MED ORDER — ONDANSETRON HCL 4 MG/2ML IJ SOLN
INTRAMUSCULAR | Status: AC
  Filled 2013-07-29: qty 2

## 2013-07-29 MED ORDER — ONDANSETRON HCL 4 MG/2ML IJ SOLN
4.0000 mg | Freq: Once | INTRAMUSCULAR | Status: AC
  Administered 2013-07-29: 4 mg via INTRAMUSCULAR

## 2013-07-29 MED ORDER — OXYCODONE-ACETAMINOPHEN 5-325 MG PO TABS: 2.0000 | ORAL_TABLET | ORAL | Status: DC | PRN

## 2013-07-29 MED ORDER — HYDROMORPHONE HCL PF 1 MG/ML IJ SOLN
INTRAMUSCULAR | Status: AC
  Filled 2013-07-29: qty 2

## 2013-07-29 MED ORDER — HYDROMORPHONE HCL PF 1 MG/ML IJ SOLN
2.0000 mg | Freq: Once | INTRAMUSCULAR | Status: AC
  Administered 2013-07-29: 2 mg via INTRAMUSCULAR

## 2013-07-29 NOTE — ED Provider Notes (Signed)
Medical screening examination/treatment/procedure(s) were performed by a resident physician or non-physician practitioner and as the supervising physician I was immediately available for consultation/collaboration.  Cottrell Gentles, MD   Kjerstin Abrigo S Lugene Hitt, MD 07/29/13 1957 

## 2013-07-29 NOTE — ED Provider Notes (Signed)
CSN: [id number]     Arrival date & time 07/29/13  1109 History     First MD Initiated Contact with Patient 07/29/13 1138     Chief Complaint  Patient presents with  . Facial Injury   (Consider location/radiation/quality/duration/timing/severity/associated sxs/prior Treatment) Patient is a 42 y.o. female presenting with facial injury. The history is provided by the patient.  Facial Injury Location:  Nose Pain details:    Quality:  Aching   Severity:  Moderate   Timing:  Constant Chronicity:  New Foreign body present:  No foreign bodies Ineffective treatments:  Ice pack Associated symptoms: no altered mental status and no wheezing   Pt hit in nose with a tree limb.   Pt complains of pain to nose,   Past Medical History  Diagnosis Date  . Psoriasis   . STD (sexually transmitted disease)     HSV II  . Abnormal Pap smear     H/O CIN II  . Migraines   . Back pain   . Costochondritis    Past Surgical History  Procedure Laterality Date  . Ovarian cyst removal  2000  . Cryotherapy  2009    of cx  . Dermoid cyst  excision  1992    left ? ovary/appendectomy  . Urethral stretching  1993   Family History  Problem Relation Age of Onset  . Thyroid disease Mother     and maternal side  . Diabetes Father   . Throat cancer Maternal Grandfather   . Cervical cancer Paternal Grandmother   . Hypertension Mother   . Heart attack Maternal Uncle   . Heart Problems Mother     heart arythmia  . Thrombocytopenia Sister     ITP   History  Substance Use Topics  . Smoking status: Never Smoker   . Smokeless tobacco: Never Used  . Alcohol Use: 1 - 1.5 oz/week    2-3 drink(s) per week   OB History   Grav Para Term Preterm Abortions TAB SAB Ect Mult Living   0              Review of Systems  Respiratory: Negative for wheezing.   All other systems reviewed and are negative.    Allergies  Codeine and Sulfa antibiotics  Home Medications   Current Outpatient Rx  Name  Route   Sig  Dispense  Refill  . Cyanocobalamin (VITAMIN B 12 PO)   Oral   Take by mouth. Stopped x 1 week-hives and itching         . ergocalciferol (VITAMIN D2) 50000 UNITS capsule   Oral   Take 50,000 Units by mouth once a week.          . Multiple Vitamins-Calcium (ONE-A-DAY WOMENS PO)   Oral   Take by mouth daily. Stopped x 1 week-hives and itching         . Norethindrone-Ethinyl Estradiol-Fe Biphas (LO LOESTRIN FE) 1 MG-10 MCG / 10 MCG tablet   Oral   Take 1 tablet by mouth daily.   3 Package   0     Lot [id number] A exp 11/14   . valACYclovir (VALTREX) 500 MG tablet      One tablet daily   90 tablet   4    BP 139/94  Pulse 100  Temp(Src) 98.2 F (36.8 C) (Oral)  Resp 21  SpO2 100%  LMP 07/20/2013 Physical Exam  Nursing note and vitals reviewed. Constitutional: She is oriented to person, place, and time.  She appears well-developed and well-nourished.  HENT:  Head: Normocephalic and atraumatic.  Mouth/Throat: Oropharynx is clear and moist.  Swollen bruised nose upper nose,  Tender to palpation, orbital rims nontender,   Eyes: Conjunctivae are normal. Pupils are equal, round, and reactive to light.  Neck: Normal range of motion.  Cardiovascular: Normal rate.   Pulmonary/Chest: Effort normal.  Musculoskeletal: Normal range of motion.  Neurological: She is alert and oriented to person, place, and time. She has normal reflexes.  Skin: Skin is warm.  Psychiatric: She has a normal mood and affect.    ED Course   Procedures (including critical care time)  Labs Reviewed - No data to display Dg Nasal Bones  07/29/2013   *RADIOLOGY REPORT*  Clinical Data: Crush injury to face, soft tissue swelling and bruising to bridge of nose and under right eye  NASAL BONES - 3+ VIEW  Comparison: None  Findings: Bilateral nondisplaced distal nasal bone fractures. Paranasal sinuses clear. Nasal septum midline. No additional fractures identified.  IMPRESSION: Nondisplaced distal nasal  bone fractures.   Original Report Authenticated By: Ulyses Southward, M.D.   1. Nasal fracture, open, initial encounter     MDM  See Dr. Jearld Fenton for recheck.   Rx Percocet, Ice,  Return if any problems.  Lonia Skinner Navarro, PA-C 07/29/13 1238

## 2013-07-29 NOTE — ED Notes (Signed)
Pt  Was  Doing   Yard  Work  And  Was  Stuck  Face  By a  Tree  limb    She  Did  Not  Black out  But  Was  Dazed           She  Has  Swelling  To  Face  /  Nose        With  eccymosis     pt ambulated  To  Room  With a  Steady  Fluid  Gait  Mother at  Bedside

## 2013-08-31 ENCOUNTER — Other Ambulatory Visit: Payer: Self-pay

## 2013-08-31 DIAGNOSIS — Z1231 Encounter for screening mammogram for malignant neoplasm of breast: Secondary | ICD-10-CM

## 2013-09-05 ENCOUNTER — Telehealth: Payer: Self-pay | Admitting: Obstetrics & Gynecology

## 2013-09-05 NOTE — Telephone Encounter (Signed)
pt canceled appointment for labwork. pt states she has not taken the medication so there is need for the appointment pt states the medication made her break out in hives.

## 2013-09-08 ENCOUNTER — Ambulatory Visit: Payer: Self-pay

## 2013-09-28 ENCOUNTER — Ambulatory Visit
Admission: RE | Admit: 2013-09-28 | Discharge: 2013-09-28 | Disposition: A | Discharge status: Home / Self Care | Payer: 59 | Source: Ambulatory Visit

## 2013-09-28 DIAGNOSIS — Z1231 Encounter for screening mammogram for malignant neoplasm of breast: Secondary | ICD-10-CM

## 2013-12-12 ENCOUNTER — Encounter (HOSPITAL_COMMUNITY): Payer: Self-pay | Admitting: Emergency Medicine

## 2013-12-12 ENCOUNTER — Emergency Department (HOSPITAL_COMMUNITY)
Admission: EM | Admit: 2013-12-12 | Discharge: 2013-12-12 | Disposition: A | Discharge status: Home / Self Care | Payer: 59 | Attending: Emergency Medicine | Admitting: Emergency Medicine

## 2013-12-12 ENCOUNTER — Emergency Department (INDEPENDENT_AMBULATORY_CARE_PROVIDER_SITE_OTHER): Payer: 59

## 2013-12-12 ENCOUNTER — Emergency Department (HOSPITAL_COMMUNITY)
Admission: EM | Admit: 2013-12-12 | Discharge: 2013-12-12 | Disposition: A | Discharge status: Home / Self Care | Payer: 59 | Source: Home / Self Care | Attending: Family Medicine | Admitting: Family Medicine

## 2013-12-12 ENCOUNTER — Emergency Department (HOSPITAL_COMMUNITY): Payer: 59

## 2013-12-12 ENCOUNTER — Other Ambulatory Visit: Payer: Self-pay

## 2013-12-12 DIAGNOSIS — R0789 Other chest pain: Secondary | ICD-10-CM

## 2013-12-12 DIAGNOSIS — R69 Illness, unspecified: Secondary | ICD-10-CM

## 2013-12-12 DIAGNOSIS — Z872 Personal history of diseases of the skin and subcutaneous tissue: Secondary | ICD-10-CM | POA: Insufficient documentation

## 2013-12-12 DIAGNOSIS — R079 Chest pain, unspecified: Secondary | ICD-10-CM

## 2013-12-12 DIAGNOSIS — R000 Tachycardia, unspecified: Secondary | ICD-10-CM

## 2013-12-12 DIAGNOSIS — G43909 Migraine, unspecified, not intractable, without status migrainosus: Secondary | ICD-10-CM | POA: Insufficient documentation

## 2013-12-12 DIAGNOSIS — J111 Influenza due to unidentified influenza virus with other respiratory manifestations: Secondary | ICD-10-CM

## 2013-12-12 DIAGNOSIS — Z8619 Personal history of other infectious and parasitic diseases: Secondary | ICD-10-CM | POA: Insufficient documentation

## 2013-12-12 DIAGNOSIS — Z8739 Personal history of other diseases of the musculoskeletal system and connective tissue: Secondary | ICD-10-CM | POA: Insufficient documentation

## 2013-12-12 DIAGNOSIS — R071 Chest pain on breathing: Secondary | ICD-10-CM | POA: Insufficient documentation

## 2013-12-12 LAB — POCT I-STAT TROPONIN I: Troponin i, poc: 0 ng/mL (ref 0.00–0.08)

## 2013-12-12 LAB — BASIC METABOLIC PANEL
BUN: 7 mg/dL (ref 6–23)
CALCIUM: 8.4 mg/dL (ref 8.4–10.5)
CO2: 24 mEq/L (ref 19–32)
Chloride: 100 mEq/L (ref 96–112)
Creatinine, Ser: 0.61 mg/dL (ref 0.50–1.10)
Glucose, Bld: 92 mg/dL (ref 70–99)
POTASSIUM: 3.9 meq/L (ref 3.7–5.3)
SODIUM: 137 meq/L (ref 137–147)

## 2013-12-12 LAB — CBC
HEMATOCRIT: 38.3 % (ref 36.0–46.0)
Hemoglobin: 12.5 g/dL (ref 12.0–15.0)
MCH: 26.2 pg (ref 26.0–34.0)
MCHC: 32.6 g/dL (ref 30.0–36.0)
MCV: 80.1 fL (ref 78.0–100.0)
Platelets: 235 10*3/uL (ref 150–400)
RBC: 4.78 MIL/uL (ref 3.87–5.11)
RDW: 14.2 % (ref 11.5–15.5)
WBC: 3.5 10*3/uL — ABNORMAL LOW (ref 4.0–10.5)

## 2013-12-12 LAB — D-DIMER, QUANTITATIVE: D-Dimer, Quant: 1.18 ug/mL-FEU — ABNORMAL HIGH (ref 0.00–0.48)

## 2013-12-12 MED ORDER — CYCLOBENZAPRINE HCL 5 MG PO TABS: 5.0000 mg | ORAL_TABLET | Freq: Three times a day (TID) | ORAL | Status: DC | PRN

## 2013-12-12 MED ORDER — OSELTAMIVIR PHOSPHATE 75 MG PO CAPS: 75.0000 mg | ORAL_CAPSULE | Freq: Two times a day (BID) | ORAL | Status: DC

## 2013-12-12 MED ORDER — KETOROLAC TROMETHAMINE 30 MG/ML IJ SOLN
30.0000 mg | Freq: Once | INTRAMUSCULAR | Status: AC
  Administered 2013-12-12: 30 mg via INTRAVENOUS
  Filled 2013-12-12: qty 1

## 2013-12-12 MED ORDER — CYCLOBENZAPRINE HCL 10 MG PO TABS
10.0000 mg | ORAL_TABLET | Freq: Once | ORAL | Status: AC
  Administered 2013-12-12: 10 mg via ORAL
  Filled 2013-12-12: qty 1

## 2013-12-12 MED ORDER — IOHEXOL 350 MG/ML SOLN
100.0000 mL | Freq: Once | INTRAVENOUS | Status: AC | PRN
  Administered 2013-12-12: 100 mL via INTRAVENOUS

## 2013-12-12 NOTE — ED Notes (Signed)
Dr. Georgina Snell is in the room w/pt already Pt c/o cold sxs onset Sunday w/sxs that include: cough, fevers, chills, nauseas, lightheaded  Also c/o chest pain  Alert and oriented/speaking in complete sentences

## 2013-12-12 NOTE — ED Notes (Signed)
Pt from urgent care and sent here to r/o MI or PE.  Pt reports temp of 101 last nite.  Pt states hurts with movement and palpation but not with deep breath

## 2013-12-12 NOTE — ED Provider Notes (Signed)
Sandy [name] is a 43 y.o. female who presents to Urgent Care today for chest pain fever cough and congestion. 3 days ago patient had central crushing exertional chest pain consistent with nausea that lasted for 4 hours. Since then she's developed a mild subjective fever cough and congestion. She has had off-and-on central chest tenderness and pain. She notes that lightheadedness initially but none since. Currently the pain is worse with chest wall motion. She denies any significant nausea vomiting or shortness of breath.  No leg swelling.  Past Medical History  Diagnosis Date  . Psoriasis   . STD (sexually transmitted disease)     HSV II  . Abnormal Pap smear     H/O CIN II  . Migraines   . Back pain   . Costochondritis    History  Substance Use Topics  . Smoking status: Never Smoker   . Smokeless tobacco: Never Used  . Alcohol Use: 1 - 1.5 oz/week    2-3 drink(s) per week   ROS as above Medications reviewed. No current facility-administered medications for this encounter.   Current Outpatient Prescriptions  Medication Sig Dispense Refill  . Cyanocobalamin (VITAMIN B 12 PO) Take by mouth. Stopped x 1 week-hives and itching      . ergocalciferol (VITAMIN D2) 50000 UNITS capsule Take 50,000 Units by mouth once a week.       . Multiple Vitamins-Calcium (ONE-A-DAY WOMENS PO) Take by mouth daily. Stopped x 1 week-hives and itching      . Norethindrone-Ethinyl Estradiol-Fe Biphas (LO LOESTRIN FE) 1 MG-10 MCG / 10 MCG tablet Take 1 tablet by mouth daily.  3 Package  0  . oxyCODONE-acetaminophen (PERCOCET/ROXICET) 5-325 MG per tablet Take 2 tablets by mouth every 4 (four) hours as needed for pain.  20 tablet  0  . valACYclovir (VALTREX) 500 MG tablet One tablet daily  90 tablet  4    Exam:  BP 119/75  Pulse 111  Temp(Src) 98.6 F (37 C) (Oral)  Resp 18  SpO2 96%  LMP 12/02/2013 Gen: Well NAD HEENT: EOMI,  MMM Lungs: Normal work of breathing. CTABL Chest wall is tender to  palpation Heart: Tachycardia but regular no MRG Abd: NABS, Soft. NT, ND Exts: Non edematous BL  LE, warm and well perfused. No unequal leg swelling   Twelve-lead EKG shows normal sinus rhythm at 97 beats per minute. No significant abnormalities noted.  No results found for this or any previous visit (from the past 24 hour(s)). Dg Chest 2 View  12/12/2013   CLINICAL DATA:  Chest pain  EXAM: CHEST  2 VIEW  COMPARISON:  None.  FINDINGS: The heart size and mediastinal contours are within normal limits. Both lungs are clear. The visualized skeletal structures are unremarkable.  IMPRESSION: No active cardiopulmonary disease.   Electronically Signed   By: Daryll Brod M.D.   On: 12/12/2013 09:08    Assessment and Plan: 43 y.o. female with tachycardia, chest pain, subjective fever and chills. I think the most likely diagnosis is costochondritis however patient's symptoms are concerning. I believe she would benefit from evaluation for both MI, and PE with troponins and d-dimer. I would like to transfer patient via EMS however she refuses and requests shuttle transportation.  If she is not currently having active crushing central chest pain I feel this is not ideal but Reasonable.  She expresses understanding and agreement that this is against my medical opinion and possibly dangerous.      Rebekah Chesterfield  Georgina Snell, MD 12/12/13 [phone]

## 2013-12-12 NOTE — ED Provider Notes (Signed)
CSN: [id number]     Arrival date & time 12/12/13  4403 History   First MD Initiated Contact with Patient 12/12/13 1106     Chief Complaint  Patient presents with  . Chest Pain   (Consider location/radiation/quality/duration/timing/severity/associated sxs/prior Treatment) HPI Patient reports acute onset of chest pain about 10:30 in the morning while she was sitting on the couch drinking coffee, she had severe chest pain described as "crushing your stuff" that was centrally located. She states the severe pain lasted for hours. She reports continued soreness until today. States it's tender to touch. She states now she also feels like she has "indigestion" which she states is a pressure or fullness feeling in her chest without burning. She states at that time she had nausea without vomiting. She felt "swimmy headed". She denied diaphoresis. She denies nausea or vomiting now. She states yesterday she went to work and felt tired all day. Last night about 9 PM she had acute onset of fever with chills and her temp was 101. She denies sore throat, rhinorrhea, diarrhea. She does describe dyspnea on exertion. She denies swelling of her legs but states she's been having any stabbing pain in her calves and knees that started last night. She denies any recent traveling. Patient is not on hormone replacement. She states she's never had this before. She denies being around anybody who is ill.  Family history is negative for PE or DVT.  PCP Dr [name] or Dr Radford Pax at [facility]  Past Medical History  Diagnosis Date  . Psoriasis   . STD (sexually transmitted disease)     HSV II  . Abnormal Pap smear     H/O CIN II  . Migraines   . Back pain   . Costochondritis    Past Surgical History  Procedure Laterality Date  . Ovarian cyst removal  2000  . Cryotherapy  2009    of cx  . Dermoid cyst  excision  1992    left ? ovary/appendectomy  . Urethral stretching  1993   Family History  Problem Relation Age of  Onset  . Thyroid disease Mother     and maternal side  . Diabetes Father   . Throat cancer Maternal Grandfather   . Cervical cancer Paternal Grandmother   . Hypertension Mother   . Heart attack Maternal Uncle   . Heart Problems Mother     heart arythmia  . Thrombocytopenia Sister     ITP   History  Substance Use Topics  . Smoking status: Never Smoker   . Smokeless tobacco: Never Used  . Alcohol Use: 1 - 1.5 oz/week    2-3 drink(s) per week   Employed in manufacturing   OB History   Grav Para Term Preterm Abortions TAB SAB Ect Mult Living   0              Review of Systems  All other systems reviewed and are negative.    Allergies  Codeine and Sulfa antibiotics  Home Medications   Current Outpatient Rx  Name  Route  Sig  Dispense  Refill  . acetaminophen (TYLENOL) 500 MG tablet   Oral   Take 1,000 mg by mouth every 6 (six) hours as needed for fever.         . cyclobenzaprine (FLEXERIL) 5 MG tablet   Oral   Take 1 tablet (5 mg total) by mouth 3 (three) times daily as needed (chest wall pain).   30 tablet  0   . oseltamivir (TAMIFLU) 75 MG capsule   Oral   Take 1 capsule (75 mg total) by mouth every 12 (twelve) hours.   10 capsule   0    BP 113/71  Pulse 82  Temp(Src) 98 F (36.7 C) (Oral)  Resp 16  SpO2 98%  LMP 12/02/2013  Vital signs normal   Physical Exam  Nursing note and vitals reviewed. Constitutional: She is oriented to person, place, and time. She appears well-developed and well-nourished.  Non-toxic appearance. She does not appear ill. No distress.  HENT:  Head: Normocephalic and atraumatic.  Right Ear: External ear normal.  Left Ear: External ear normal.  Nose: Nose normal. No mucosal edema or rhinorrhea.  Mouth/Throat: Oropharynx is clear and moist and mucous membranes are normal. No dental abscesses or uvula swelling.  Eyes: Conjunctivae and EOM are normal. Pupils are equal, round, and reactive to light.  Neck: Normal range of  motion and full passive range of motion without pain. Neck supple.  Cardiovascular: Normal rate, regular rhythm and normal heart sounds.  Exam reveals no gallop and no friction rub.   No murmur heard. Pulmonary/Chest: Effort normal and breath sounds normal. No respiratory distress. She has no wheezes. She has no rhonchi. She has no rales. She exhibits no tenderness and no crepitus.    Tender over her lower costochondral junctions with a small area that is the most tender as noted.  Abdominal: Soft. Normal appearance and bowel sounds are normal. She exhibits no distension. There is no tenderness. There is no rebound and no guarding.  Musculoskeletal: Normal range of motion. She exhibits no edema and no tenderness.  Moves all extremities well.   Neurological: She is alert and oriented to person, place, and time. She has normal strength. No cranial nerve deficit.  Skin: Skin is warm, dry and intact. No rash noted. No erythema. No pallor.  Psychiatric: She has a normal mood and affect. Her speech is normal and behavior is normal. Her mood appears not anxious.    ED Course  Procedures (including critical care time)  Medications  ketorolac (TORADOL) 30 MG/ML injection 30 mg (30 mg Intravenous Given 12/12/13 1222)  cyclobenzaprine (FLEXERIL) tablet 10 mg (10 mg Oral Given 12/12/13 1222)  iohexol (OMNIPAQUE) 350 MG/ML injection 100 mL (100 mLs Intravenous Contrast Given 12/12/13 1319)   13:00 discussed test results and need to CT angio chest. Pt states she is not sexually active.   Pt states her chest pain is improved with the medications.   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  Labs Review Results for orders placed during the hospital encounter of 12/12/13  CBC      Result Value Range   WBC 3.5 (*) 4.0 - 10.5 K/uL   RBC 4.78  3.87 - 5.11 MIL/uL   Hemoglobin 12.5  12.0 - 15.0 g/dL   HCT 38.3  36.0 - 46.0 %   MCV 80.1  78.0 - 100.0 fL   MCH 26.2  26.0 - 34.0 pg   MCHC 32.6   30.0 - 36.0 g/dL   RDW 14.2  11.5 - 15.5 %   Platelets 235  150 - 400 K/uL  BASIC METABOLIC PANEL      Result Value Range   Sodium 137  137 - 147 mEq/L   Potassium 3.9  3.7 - 5.3 mEq/L   Chloride 100  96 - 112 mEq/L   CO2 24  19 - 32 mEq/L   Glucose,  Bld 92  70 - 99 mg/dL   BUN 7  6 - 23 mg/dL   Creatinine, Ser 0.61  0.50 - 1.10 mg/dL   Calcium 8.4  8.4 - 10.5 mg/dL   GFR calc non Af Amer >90  >90 mL/min   GFR calc Af Amer >90  >90 mL/min  D-DIMER, QUANTITATIVE      Result Value Range   D-Dimer, Quant 1.18 (*) 0.00 - 0.48 ug/mL-FEU  POCT I-STAT TROPONIN I      Result Value Range   Troponin i, poc 0.00  0.00 - 0.08 ng/mL   Comment 3             Laboratory interpretation all normal except elevated d-dimer, low total WBC count without neutropenia   Imaging Review Dg Chest 2 View  12/12/2013   CLINICAL DATA:  Chest pain  EXAM: CHEST  2 VIEW  COMPARISON:  None.  FINDINGS: The heart size and mediastinal contours are within normal limits. Both lungs are clear. The visualized skeletal structures are unremarkable.  IMPRESSION: No active cardiopulmonary disease.   Electronically Signed   By: Daryll Brod M.D.   On: 12/12/2013 09:08    Ct Angio Chest W/cm &/or Wo Cm  12/12/2013   CLINICAL DATA:  Acute severe chest pain with shortness of breath and elevated D-dimer levels. Question pulmonary embolism.  EXAM: CT ANGIOGRAPHY CHEST WITH CONTRAST  TECHNIQUE: Multidetector CT imaging of the chest was performed using the standard protocol during bolus administration of intravenous contrast. Multiplanar CT image reconstructions including MIPs were obtained to evaluate the vascular anatomy.  CONTRAST:  140m OMNIPAQUE IOHEXOL 350 MG/ML SOLN  COMPARISON:  Chest radiographs same date.  FINDINGS: The pulmonary arteries are well opacified with contrast. There is no evidence of acute pulmonary embolism. The thoracic aorta appears normal. No significant atherosclerosis is demonstrated.  There is no pleural or  pericardial effusion. There are no enlarged mediastinal, hilar or axillary lymph nodes.  There is mild dependent atelectasis at both lung bases. There is a small calcified right upper lobe granuloma. No confluent airspace opacity or suspicious pulmonary nodule is demonstrated.  The visualized upper abdomen appears normal. No osseous abnormalities are seen.  Review of the MIP images confirms the above findings.  IMPRESSION: No evidence of acute pulmonary embolism are other acute chest process.   Electronically Signed   By: BCamie PatienceM.D.   On: 12/12/2013 13:43       EKG Interpretation    Date/Time:  Tuesday December 12 2013 09:57:37 EST Ventricular Rate:  105 PR Interval:  156 QRS Duration: 84 QT Interval:  322 QTC Calculation: 425 R Axis:   102 Text Interpretation:  Sinus tachycardia Rightward axis No significant change since last tracing Confirmed by Zenith Lamphier  MD-I, Monchel Pollitt ([id number]) on 12/12/2013 11:08:28 AM            MDM   1. Influenza-like illness   2. Chest wall pain    New Prescriptions   CYCLOBENZAPRINE (FLEXERIL) 5 MG TABLET    Take 1 tablet (5 mg total) by mouth 3 (three) times daily as needed (chest wall pain).   OSELTAMIVIR (TAMIFLU) 75 MG CAPSULE    Take 1 capsule (75 mg total) by mouth every 12 (twelve) hours.  OTC ibuprofen and acetaminophen  Plan discharge   IRolland Porter MD, FAlanson Aly MD 12/12/13 1534

## 2013-12-12 NOTE — Discharge Instructions (Signed)
Drink plenty of fluids. Take ibuprofen 600 mg + acetaminophen 1000 mg every 6 hrs for pain. Take the tamiflu until gone. The ibuprofen with the flexeril should help with your chest wall pain. Recheck if you get worse such as uncontrolled vomiting or diarrhea, your cough lasts more than another 7-10 days or you feel worse.     Chest Wall Pain Chest wall pain is pain in or around the bones and muscles of your chest. It may take up to 6 weeks to get better. It may take longer if you must stay physically active in your work and activities.  CAUSES  Chest wall pain may happen on its own. However, it may be caused by:  A viral illness like the flu.  Injury.  Coughing.  Exercise.  Arthritis.  Fibromyalgia.  Shingles. HOME CARE INSTRUCTIONS   Avoid overtiring physical activity. Try not to strain or perform activities that cause pain. This includes any activities using your chest or your abdominal and side muscles, especially if heavy weights are used.  Put ice on the sore area.  Put ice in a plastic bag.  Place a towel between your skin and the bag.  Leave the ice on for 15-20 minutes per hour while awake for the first 2 days.  Only take over-the-counter or prescription medicines for pain, discomfort, or fever as directed by your caregiver. SEEK IMMEDIATE MEDICAL CARE IF:   Your pain increases, or you are very uncomfortable.  You have a fever.  Your chest pain becomes worse.  You have new, unexplained symptoms.  You have nausea or vomiting.  You feel sweaty or lightheaded.  You have a cough with phlegm (sputum), or you cough up blood. MAKE SURE YOU:   Understand these instructions.  Will watch your condition.  Will get help right away if you are not doing well or get worse. Document Released: 11/23/2005 Document Revised: 02/15/2012 Document Reviewed: 07/20/2011 [facility] Patient Information 2014 [place], [place].  Viral Infections A viral infection can be caused by  different types of viruses.Most viral infections are not serious and resolve on their own. However, some infections may cause severe symptoms and may lead to further complications. SYMPTOMS Viruses can frequently cause:  Minor sore throat.  Aches and pains.  Headaches.  Runny nose.  Different types of rashes.  Watery eyes.  Tiredness.  Cough.  Loss of appetite.  Gastrointestinal infections, resulting in nausea, vomiting, and diarrhea. These symptoms do not respond to antibiotics because the infection is not caused by bacteria. However, you might catch a bacterial infection following the viral infection. This is sometimes called a "superinfection." Symptoms of such a bacterial infection may include:  Worsening sore throat with pus and difficulty swallowing.  Swollen neck glands.  Chills and a high or persistent fever.  Severe headache.  Tenderness over the sinuses.  Persistent overall ill feeling (malaise), muscle aches, and tiredness (fatigue).  Persistent cough.  Yellow, green, or brown mucus production with coughing. HOME CARE INSTRUCTIONS   Only take over-the-counter or prescription medicines for pain, discomfort, diarrhea, or fever as directed by your caregiver.  Drink enough water and fluids to keep your urine clear or pale yellow. Sports drinks can provide valuable electrolytes, sugars, and hydration.  Get plenty of rest and maintain proper nutrition. Soups and broths with crackers or rice are fine. SEEK IMMEDIATE MEDICAL CARE IF:   You have severe headaches, shortness of breath, chest pain, neck pain, or an unusual rash.  You have uncontrolled vomiting, diarrhea,  or you are unable to keep down fluids.  You or your child has an oral temperature above 102 F (38.9 C), not controlled by medicine.  Your baby is older than 3 months with a rectal temperature of 102 F (38.9 C) or higher.  Your baby is 30 months old or younger with a rectal temperature of  100.4 F (38 C) or higher. MAKE SURE YOU:   Understand these instructions.  Will watch your condition.  Will get help right away if you are not doing well or get worse. Document Released: 09/02/2005 Document Revised: 02/15/2012 Document Reviewed: 03/30/2011 [facility] Patient Information 2014 [place], [place].

## 2013-12-22 ENCOUNTER — Encounter: Payer: Self-pay | Admitting: Oncology

## 2013-12-22 ENCOUNTER — Other Ambulatory Visit (HOSPITAL_BASED_OUTPATIENT_CLINIC_OR_DEPARTMENT_OTHER): Payer: 59

## 2013-12-22 ENCOUNTER — Ambulatory Visit (HOSPITAL_BASED_OUTPATIENT_CLINIC_OR_DEPARTMENT_OTHER): Payer: 59 | Admitting: Oncology

## 2013-12-22 ENCOUNTER — Telehealth: Payer: Self-pay | Admitting: *Deleted

## 2013-12-22 VITALS — BP 138/83 | HR 100 | Temp 98.4°F | Resp 20 | Ht 70.0 in | Wt 233.5 lb

## 2013-12-22 DIAGNOSIS — D509 Iron deficiency anemia, unspecified: Secondary | ICD-10-CM

## 2013-12-22 DIAGNOSIS — D649 Anemia, unspecified: Secondary | ICD-10-CM

## 2013-12-22 LAB — CBC WITH DIFFERENTIAL/PLATELET
BASO%: 0.2 % (ref 0.0–2.0)
Basophils Absolute: 0 10*3/uL (ref 0.0–0.1)
EOS%: 2.8 % (ref 0.0–7.0)
Eosinophils Absolute: 0.2 10*3/uL (ref 0.0–0.5)
HEMATOCRIT: 35.1 % (ref 34.8–46.6)
HGB: 11.4 g/dL — ABNORMAL LOW (ref 11.6–15.9)
LYMPH#: 1.5 10*3/uL (ref 0.9–3.3)
LYMPH%: 24.1 % (ref 14.0–49.7)
MCH: 25.7 pg (ref 25.1–34.0)
MCHC: 32.6 g/dL (ref 31.5–36.0)
MCV: 78.7 fL — AB (ref 79.5–101.0)
MONO#: 1 10*3/uL — ABNORMAL HIGH (ref 0.1–0.9)
MONO%: 15.2 % — AB (ref 0.0–14.0)
NEUT#: 3.7 10*3/uL (ref 1.5–6.5)
NEUT%: 57.7 % (ref 38.4–76.8)
PLATELETS: 340 10*3/uL (ref 145–400)
RBC: 4.46 10*6/uL (ref 3.70–5.45)
RDW: 15.1 % — ABNORMAL HIGH (ref 11.2–14.5)
WBC: 6.4 10*3/uL (ref 3.9–10.3)

## 2013-12-22 LAB — COMPREHENSIVE METABOLIC PANEL (CC13)
ALK PHOS: 65 U/L (ref 40–150)
ALT: 52 U/L (ref 0–55)
AST: 25 U/L (ref 5–34)
Albumin: 3.6 g/dL (ref 3.5–5.0)
Anion Gap: 8 mEq/L (ref 3–11)
BILIRUBIN TOTAL: 0.4 mg/dL (ref 0.20–1.20)
BUN: 9.6 mg/dL (ref 7.0–26.0)
CO2: 25 mEq/L (ref 22–29)
CREATININE: 0.7 mg/dL (ref 0.6–1.1)
Calcium: 9.3 mg/dL (ref 8.4–10.4)
Chloride: 106 mEq/L (ref 98–109)
Glucose: 97 mg/dl (ref 70–140)
Potassium: 4 mEq/L (ref 3.5–5.1)
Sodium: 139 mEq/L (ref 136–145)
Total Protein: 7.1 g/dL (ref 6.4–8.3)

## 2013-12-22 LAB — IRON AND TIBC CHCC
%SAT: 28 % (ref 21–57)
Iron: 92 ug/dL (ref 41–142)
TIBC: 324 ug/dL (ref 236–444)
UIBC: 232 ug/dL (ref 120–384)

## 2013-12-22 LAB — FERRITIN CHCC: Ferritin: 62 ng/ml (ref 9–269)

## 2013-12-22 NOTE — Telephone Encounter (Signed)
appts made and printed. Pt is aware that i will call her w/ iv appt. i emailed MW to add the tx...td

## 2013-12-22 NOTE — Progress Notes (Signed)
Hematology and Oncology Follow Up Visit  Sandy [name] [id number] 1971/01/29 42 y.o. 12/22/2013 4:26 PM  CC: [name], M.D.    Principle Diagnosis: This is a 43 year old female with iron-deficiency anemia diagnosed back in March 2012.  Prior Therapy: Patient status post total iron replacement with Feraheme in April 2012.  Current therapy: Surveillance only.  Interim History: [name] presents today for a followup visit.  She is a 43 year old woman with iron-deficiency anemia.  She had presented initially in March 2012 with a hemoglobin of 10.  Her ferritin was 5, iron level of 17 and an MCV of 71.  After 1 g of Feraheme, her hemoglobin had normalized.  She reports continued improvement in her activity level and her energy.  She reports that her overall menorrhagia has improved.  She really has regular menstrual cycles at this time. She reports fatigue at times, but much less than before. No other bleeding episodes noted. Recently treated for the flu with Tamiflu. Reports that symptoms are overall better, but has been having bilateral hand pain for the past few days.  Medications: I have reviewed the patient's current medications. Current Outpatient Prescriptions  Medication Sig Dispense Refill  . acetaminophen (TYLENOL) 500 MG tablet Take 1,000 mg by mouth every 6 (six) hours as needed for fever.      . cyclobenzaprine (FLEXERIL) 5 MG tablet Take 1 tablet (5 mg total) by mouth 3 (three) times daily as needed (chest wall pain).  30 tablet  0   No current facility-administered medications for this visit.    Allergies:  Allergies  Allergen Reactions  . Codeine Nausea And Vomiting    Excessive Sweating  . Sulfa Antibiotics     Past Medical History, Surgical history, Social history, and Family History were reviewed and updated.  Review of Systems: Constitutional:  Negative for fever, chills, night sweats, anorexia, weight loss, pain. Cardiovascular: no chest pain or dyspnea  on exertion Respiratory: no cough, shortness of breath, or wheezing Neurological: no TIA or stroke symptoms Dermatological: negative ENT: negative Skin: Negative. Gastrointestinal: no abdominal pain, change in bowel habits, or black or bloody stools Genito-Urinary: no dysuria, trouble voiding, or hematuria Hematological and Lymphatic: negative Breast: negative Musculoskeletal: negative Remaining ROS negative.  Physical Exam: Blood pressure 138/83, pulse 100, temperature 98.4 F (36.9 C), temperature source Oral, resp. rate 20, height 5' 10"  (1.778 m), weight 233 lb 8 oz (105.915 kg), last menstrual period 12/02/2013. ECOG: 0 General appearance: alert Head: Normocephalic, without obvious abnormality, atraumatic Neck: no adenopathy, no carotid bruit, no JVD, supple, symmetrical, trachea midline and thyroid not enlarged, symmetric, no tenderness/mass/nodules Lymph nodes: Cervical, supraclavicular, and axillary nodes normal. Heart:regular rate and rhythm, S1, S2 normal, no murmur, click, rub or gallop Lung:chest clear, no wheezing, rales, normal symmetric air entry Abdomen: soft, non-tender, without masses or organomegaly EXT:no erythema, induration, or nodules   Lab Results: Lab Results  Component Value Date   WBC 6.4 12/22/2013   HGB 11.4* 12/22/2013   HCT 35.1 12/22/2013   MCV 78.7* 12/22/2013   PLT 340 12/22/2013            Impression and Plan:  This is a pleasant 43 year old woman with the following issues: 1. Iron-deficiency anemia.  Her Hemoglobin and MCV are low today. I will schedule her for IV Feraheme within the next 1-2 weeks. 2. Menorrhagia.  Seems to be improved at this point.  She will follow up with her OB/GYN. 3. Follow-up. In 6 months  Mikey Bussing 1/16/20154:26 PM

## 2013-12-26 ENCOUNTER — Telehealth: Payer: Self-pay | Admitting: *Deleted

## 2013-12-26 NOTE — Telephone Encounter (Signed)
Patient called asking when she is to receive Feraheme.  Appointment scheduled this morning for 12-28-2013 at 1:30 pm.  Date and time given.  Expressed that she "can't work, I feel like s_ _ _.  You all knew on Friday I need feraheme and it's been four days and now I have to wait till Thursday".  Patient hung up and ended call.

## 2013-12-26 NOTE — Telephone Encounter (Signed)
Per staff message and POF I have scheduled appts.  JMW  

## 2013-12-28 ENCOUNTER — Ambulatory Visit (HOSPITAL_BASED_OUTPATIENT_CLINIC_OR_DEPARTMENT_OTHER): Payer: 59

## 2013-12-28 ENCOUNTER — Other Ambulatory Visit: Payer: Self-pay | Admitting: *Deleted

## 2013-12-28 VITALS — BP 129/83 | HR 83 | Temp 98.1°F

## 2013-12-28 DIAGNOSIS — D509 Iron deficiency anemia, unspecified: Secondary | ICD-10-CM

## 2013-12-28 DIAGNOSIS — D649 Anemia, unspecified: Secondary | ICD-10-CM

## 2013-12-28 MED ORDER — SODIUM CHLORIDE 0.9 % IV SOLN
Freq: Once | INTRAVENOUS | Status: AC
  Administered 2013-12-28: 14:00:00 via INTRAVENOUS

## 2013-12-28 MED ORDER — SODIUM CHLORIDE 0.9 % IV SOLN
1020.0000 mg | Freq: Once | INTRAVENOUS | Status: AC
  Administered 2013-12-28: 1020 mg via INTRAVENOUS
  Filled 2013-12-28: qty 34

## 2013-12-28 NOTE — Patient Instructions (Signed)

## 2014-06-21 ENCOUNTER — Ambulatory Visit (HOSPITAL_BASED_OUTPATIENT_CLINIC_OR_DEPARTMENT_OTHER): Payer: 59 | Admitting: Oncology

## 2014-06-21 ENCOUNTER — Encounter: Payer: Self-pay | Admitting: Oncology

## 2014-06-21 ENCOUNTER — Other Ambulatory Visit (HOSPITAL_BASED_OUTPATIENT_CLINIC_OR_DEPARTMENT_OTHER): Payer: 59

## 2014-06-21 ENCOUNTER — Telehealth: Payer: Self-pay | Admitting: Oncology

## 2014-06-21 VITALS — BP 114/67 | HR 79 | Temp 98.3°F | Resp 18 | Ht 70.0 in | Wt 227.3 lb

## 2014-06-21 DIAGNOSIS — D500 Iron deficiency anemia secondary to blood loss (chronic): Secondary | ICD-10-CM

## 2014-06-21 DIAGNOSIS — D649 Anemia, unspecified: Secondary | ICD-10-CM

## 2014-06-21 LAB — CBC WITH DIFFERENTIAL/PLATELET
BASO%: 1.1 % (ref 0.0–2.0)
Basophils Absolute: 0.1 10*3/uL (ref 0.0–0.1)
EOS ABS: 0.1 10*3/uL (ref 0.0–0.5)
EOS%: 1 % (ref 0.0–7.0)
HCT: 38 % (ref 34.8–46.6)
HGB: 12.4 g/dL (ref 11.6–15.9)
LYMPH%: 23.8 % (ref 14.0–49.7)
MCH: 27.9 pg (ref 25.1–34.0)
MCHC: 32.6 g/dL (ref 31.5–36.0)
MCV: 85.8 fL (ref 79.5–101.0)
MONO#: 0.9 10*3/uL (ref 0.1–0.9)
MONO%: 10.2 % (ref 0.0–14.0)
NEUT%: 63.9 % (ref 38.4–76.8)
NEUTROS ABS: 5.5 10*3/uL (ref 1.5–6.5)
Platelets: 316 10*3/uL (ref 145–400)
RBC: 4.43 10*6/uL (ref 3.70–5.45)
RDW: 12.6 % (ref 11.2–14.5)
WBC: 8.6 10*3/uL (ref 3.9–10.3)
lymph#: 2 10*3/uL (ref 0.9–3.3)

## 2014-06-21 NOTE — Telephone Encounter (Signed)
gv and printed appt sched and avs for pt for Jan 2016

## 2014-06-21 NOTE — Progress Notes (Signed)
Hematology and Oncology Follow Up Visit  Sandy [name] [id number] [birth year] 42 y.o. 06/21/2014 3:43 PM  CC: Sandy [name]. Sandy [name], M.D.    Principle Diagnosis: This is a 43 year old female with iron-deficiency anemia diagnosed back in March 2012.  Prior Therapy: Patient status post total iron replacement with Feraheme in April 2012. Repeated in 12/2013.   Current therapy: Surveillance only.  Interim History: [name] presents today for a followup visit. Since her last visit, she reports continued improvement in her activity level and her energy.  She reports that her overall menorrhagia has improved.  She really has regular menstrual cycles at this time. She reports fatigue at times, but much less than her last visit.  No other bleeding episodes noted.  She has not reported any infusion-related problems to [facility]. She has not reported any headaches or blurry vision or double vision. She is not reporting any chest pain shortness of breath or difficulty breathing. Is not reporting any nausea or vomiting or abdominal pain. Does not report any dyspepsia or reflux symptoms. She has not reported any urinary symptoms. Rest of review of systems unremarkable.  Medications: I have reviewed the patient's current medications. Current Outpatient Prescriptions  Medication Sig Dispense Refill  . acetaminophen (TYLENOL) 500 MG tablet Take 1,000 mg by mouth every 6 (six) hours as needed for fever.      . cyclobenzaprine (FLEXERIL) 5 MG tablet Take 1 tablet (5 mg total) by mouth 3 (three) times daily as needed (chest wall pain).  30 tablet  0  . vitamin B-12 (CYANOCOBALAMIN) 1000 MCG tablet Take 1,000 mcg by mouth daily.       No current facility-administered medications for this visit.    Allergies:  Allergies  Allergen Reactions  . Codeine Nausea And Vomiting    Excessive Sweating  . Sulfa Antibiotics     Past Medical History, Surgical history, Social history, and Family History were reviewed and  updated.    Physical Exam: Blood pressure 114/67, pulse 79, temperature 98.3 F (36.8 C), temperature source Oral, resp. rate 18, height 5' 10"  (1.778 m), weight 227 lb 4.8 oz (103.103 kg), SpO2 99.00%. ECOG: 0 General appearance: alert Head: Normocephalic, without obvious abnormality Neck: no adenopathy Lymph nodes: Cervical, supraclavicular, and axillary nodes normal. Heart:regular rate and rhythm, S1, S2 normal, no murmur, click, rub or gallop Lung:chest clear, no wheezing, rales, normal symmetric air entry Abdomen: soft, non-tender, without masses or organomegaly EXT:no erythema, induration, or nodules   Lab Results: Lab Results  Component Value Date   WBC 8.6 06/21/2014   HGB 12.4 06/21/2014   HCT 38.0 06/21/2014   MCV 85.8 06/21/2014   PLT 316 06/21/2014            Impression and Plan:  This is a pleasant 43 year old woman with the following issues: 1. Iron-deficiency anemia.  Her hemoglobin has normalized after IV iron in January of 2015. She is asymptomatic at this point and her energy has improved dramatically. Plan to continue active surveillance and no IV iron at this time. 2. Menorrhagia.  Seems to be improved at this point.  She will follow up with her OB/GYN. 3. Follow-up. In 6 months    [facility] 7/16/20153:43 PM

## 2014-06-22 LAB — IRON AND TIBC CHCC
%SAT: 24 % (ref 21–57)
IRON: 77 ug/dL (ref 41–142)
TIBC: 319 ug/dL (ref 236–444)
UIBC: 242 ug/dL (ref 120–384)

## 2014-06-22 LAB — FERRITIN CHCC: FERRITIN: 18 ng/mL (ref 9–269)

## 2014-08-16 ENCOUNTER — Encounter: Payer: Self-pay | Admitting: Obstetrics & Gynecology

## 2014-08-16 ENCOUNTER — Ambulatory Visit (INDEPENDENT_AMBULATORY_CARE_PROVIDER_SITE_OTHER): Payer: 59 | Admitting: Obstetrics & Gynecology

## 2014-08-16 DIAGNOSIS — Z01419 Encounter for gynecological examination (general) (routine) without abnormal findings: Secondary | ICD-10-CM

## 2014-08-16 DIAGNOSIS — Z0000 Encounter for general adult medical examination without abnormal findings: Secondary | ICD-10-CM

## 2014-08-16 DIAGNOSIS — Z124 Encounter for screening for malignant neoplasm of cervix: Secondary | ICD-10-CM

## 2014-08-16 LAB — POCT URINALYSIS DIPSTICK
Bilirubin, UA: NEGATIVE
Glucose, UA: NEGATIVE
Ketones, UA: NEGATIVE
LEUKOCYTES UA: NEGATIVE
Nitrite, UA: NEGATIVE
PROTEIN UA: NEGATIVE
UROBILINOGEN UA: NEGATIVE
pH, UA: 5

## 2014-08-16 LAB — HEMOGLOBIN, FINGERSTICK: Hemoglobin, fingerstick: 13.9 g/dL (ref 12.0–16.0)

## 2014-08-16 NOTE — Progress Notes (Signed)
43 y.o. G0P0 SingleCaucasianF here for annual exam.  Job is moving to [place] so job will end Nov 1st.  Cycles are regular.  Flow it heavy at times.  Pt has declined treatment in the past.  Last IV iron infusion was January.  Saw Dr. Alen Blew in July.  Has follow again in a year.   Psoriasis is worse right now but pt reports this is common with stress.  PCP:  Dr. [name].  Last appt was March-ish.  Labs were good, including cholesterol.  Patient's last menstrual period was 07/26/2014.          Sexually active: No.  The current method of family planning is none.    Exercising: Yes.    walking Smoker:  no  Health Maintenance: Pap:  05/12/12 WNL/negative HR HPV History of abnormal Pap:  yes MMG:  09/28/13-normal Colonoscopy:  3/09 BMD:   none TDaP:  11/20/09 Screening Labs: 3/15 with PCP, Hb today: 13.9, Urine today: WBC-trace   reports that she has never smoked. She has never used smokeless tobacco. She reports that she drinks about .5 ounces of alcohol per week. She reports that she does not use illicit drugs.  Past Medical History  Diagnosis Date  . Psoriasis   . STD (sexually transmitted disease)     HSV II  . Abnormal Pap smear     H/O CIN II  . Migraines   . Back pain   . Costochondritis     Past Surgical History  Procedure Laterality Date  . Ovarian cyst removal  2000  . Cryotherapy  2009    of cx  . Dermoid cyst  excision  1992    left ? ovary/appendectomy  . Urethral stretching  1993    Current Outpatient Prescriptions  Medication Sig Dispense Refill  . cyclobenzaprine (FLEXERIL) 5 MG tablet Take 1 tablet (5 mg total) by mouth 3 (three) times daily as needed (chest wall pain).  30 tablet  0   No current facility-administered medications for this visit.    Family History  Problem Relation Age of Onset  . Thyroid disease Mother     and maternal side  . Diabetes Father   . Throat cancer Maternal Grandfather   . Cervical cancer Paternal Grandmother   .  Hypertension Mother   . Heart attack Maternal Uncle   . Heart Problems Mother     heart arythmia  . Thrombocytopenia Sister     ITP    ROS:  Pertinent items are noted in HPI.  Otherwise, a comprehensive ROS was negative.  Exam:   LMP 07/26/2014       Ht Readings from Last 3 Encounters:  06/21/14 5' 10"  (1.778 m)  12/22/13 5' 10"  (1.778 m)  06/21/13 5' 10.75" (1.797 m)    General appearance: alert, cooperative and appears stated age Head: Normocephalic, without obvious abnormality, atraumatic Neck: no adenopathy, supple, symmetrical, trachea midline and thyroid normal to inspection and palpation Lungs: clear to auscultation bilaterally Breasts: normal appearance, no masses or tenderness Heart: regular rate and rhythm Abdomen: soft, non-tender; bowel sounds normal; no masses,  no organomegaly Extremities: extremities normal, atraumatic, no cyanosis or edema Skin: Skin color, texture, turgor normal. No rashes or lesions Lymph nodes: Cervical, supraclavicular, and axillary nodes normal. No abnormal inguinal nodes palpated Neurologic: Grossly normal   Pelvic: External genitalia:  no lesions              Urethra:  normal appearing urethra with no masses, tenderness or  lesions              Bartholins and Skenes: normal                 Vagina: normal appearing vagina with normal color and discharge, no lesions              Cervix: no lesions              Pap taken: Yes.   Bimanual Exam:  Uterus:  normal size, contour, position, consistency, mobility, non-tender              Adnexa: normal adnexa and no mass, fullness, tenderness               Rectovaginal: Confirms               Anus:  normal sphincter tone, no lesions  A:  WWell Woman with normal exam  H/O HSV II  Low Vit D  Psoriasis  Menorrhagia.  Tried OCPs last year.  Not interested in any additional treatment at this time. Midback pain  Iron deficiency anemia.  Followed by Dr. Alen Blew.  On yearly follow up now.  P:  Mammogram yearly.  pap smear today. H/O CIN II 2009.  Takes Valtex 1 gram daily for suppression, when she takes it.  Declines need for rx today. Vit D today. return annually or prn   An After Visit Summary was printed and given to the patient.

## 2014-08-17 LAB — VITAMIN D 25 HYDROXY (VIT D DEFICIENCY, FRACTURES): VIT D 25 HYDROXY: 26 ng/mL — AB (ref 30–89)

## 2014-08-17 LAB — IPS PAP TEST WITH REFLEX TO HPV

## 2014-08-21 ENCOUNTER — Telehealth: Payer: Self-pay

## 2014-08-21 MED ORDER — VITAMIN D (ERGOCALCIFEROL) 1.25 MG (50000 UNIT) PO CAPS: 50000.0000 [IU] | ORAL_CAPSULE | ORAL | Status: DC

## 2014-08-21 NOTE — Telephone Encounter (Signed)
Message copied by Robley Fries on Tue Aug 21, 2014 10:35 AM ------      Message from: Megan Salon      Created: Fri Aug 17, 2014  6:07 AM       Inform Vit D is a little low.  Can take 2000 IU OTC and this should be good or I can restart her on the prescription dosage--50,000 IU every other week.  Recheck 1 year. ------

## 2014-08-21 NOTE — Telephone Encounter (Signed)
Patient aware of results. Would like Rx for Vitamin D sent to [facility] [address] to take every other week.//kn

## 2014-08-27 ENCOUNTER — Other Ambulatory Visit: Payer: Self-pay

## 2014-08-27 DIAGNOSIS — Z1231 Encounter for screening mammogram for malignant neoplasm of breast: Secondary | ICD-10-CM

## 2014-09-28 ENCOUNTER — Ambulatory Visit: Payer: 59 | Admitting: Podiatrist

## 2014-10-02 ENCOUNTER — Encounter (INDEPENDENT_AMBULATORY_CARE_PROVIDER_SITE_OTHER): Payer: Self-pay

## 2014-10-02 ENCOUNTER — Ambulatory Visit
Admission: RE | Admit: 2014-10-02 | Discharge: 2014-10-02 | Disposition: A | Discharge status: Home / Self Care | Payer: 59 | Source: Ambulatory Visit

## 2014-10-02 DIAGNOSIS — Z1231 Encounter for screening mammogram for malignant neoplasm of breast: Secondary | ICD-10-CM

## 2014-10-03 ENCOUNTER — Ambulatory Visit (INDEPENDENT_AMBULATORY_CARE_PROVIDER_SITE_OTHER): Payer: 59 | Admitting: Podiatrist

## 2014-10-03 ENCOUNTER — Ambulatory Visit (INDEPENDENT_AMBULATORY_CARE_PROVIDER_SITE_OTHER): Payer: 59

## 2014-10-03 VITALS — BP 154/76 | HR 64 | Resp 12

## 2014-10-03 DIAGNOSIS — M7750 Other enthesopathy of unspecified foot: Secondary | ICD-10-CM

## 2014-10-03 DIAGNOSIS — R52 Pain, unspecified: Secondary | ICD-10-CM

## 2014-10-03 DIAGNOSIS — M6588 Other synovitis and tenosynovitis, other site: Secondary | ICD-10-CM

## 2014-10-03 NOTE — Progress Notes (Signed)
   Subjective:    Patient ID: Sandy [name], female    DOB: 1971-05-25, 43 y.o.   MRN: [id number]  HPI  PT STATED LT OUTSIDE OF THE FOOT IS BEEN PAINFUL FOR 6 MONTHS. THE FOOT IS GETTING WORSE AND GET AGGRAVATED BY PRESSURE. TRIED NO TREATMENT.  Review of Systems  All other systems reviewed and are negative.      Objective:   Physical Exam Patient is awake, alert, and oriented x 3.  In no acute distress.  Vascular status is intact with palpable pedal pulses at 2/4 DP and PT bilateral and capillary refill time within normal limits. Neurological sensation is also intact bilaterally via Semmes Weinstein monofilament at 5/5 sites. Light touch, vibratory sensation, Achilles tendon reflex is intact. Dermatological exam reveals skin color, turger and texture as normal. No open lesions present.  Musculature intact with dorsiflexion, plantarflexion, inversion, eversion  Plantar lateral aspect of the foot is irritated and painful.  Pain along the peroneal tendons is noted. Tendons are palpable and intact.     Assessment & Plan:  Tendonitis  Plan:  Recommended antiinflammatories and injection therapy as well as discussed orthotic therapy.  She will be seen back for a recheck in 3 weeks.

## 2014-10-03 NOTE — Patient Instructions (Signed)
Tendinitis Tendinitis is swelling and inflammation of the tendons. Tendons are band-like tissues that connect muscle to bone. Tendinitis commonly occurs in the:   Shoulders (rotator cuff).  Heels (Achilles tendon).  Elbows (triceps tendon). CAUSES Tendinitis is usually caused by overusing the tendon, muscles, and joints involved. When the tissue surrounding a tendon (synovium) becomes inflamed, it is called tenosynovitis. Tendinitis commonly develops in people whose jobs require repetitive motions. SYMPTOMS  Pain.  Tenderness.  Mild swelling. DIAGNOSIS Tendinitis is usually diagnosed by physical exam. Your health care provider may also order X-rays or other imaging tests. TREATMENT Your health care provider may recommend certain medicines or exercises for your treatment. HOME CARE INSTRUCTIONS   Use a sling or splint for as long as directed by your health care provider until the pain decreases.  Put ice on the injured area.  Put ice in a plastic bag.  Place a towel between your skin and the bag.  Leave the ice on for 15-20 minutes, 3-4 times a day, or as directed by your health care provider.  Avoid using the limb while the tendon is painful. Perform gentle range of motion exercises only as directed by your health care provider. Stop exercises if pain or discomfort increase, unless directed otherwise by your health care provider.  Only take over-the-counter or prescription medicines for pain, discomfort, or fever as directed by your health care provider. SEEK MEDICAL CARE IF:   Your pain and swelling increase.  You develop new, unexplained symptoms, especially increased numbness in the hands. MAKE SURE YOU:   Understand these instructions.  Will watch your condition.  Will get help right away if you are not doing well or get worse. Document Released: 11/20/2000 Document Revised: 04/09/2014 Document Reviewed: 02/09/2011 [facility] Patient Information 2015 [place],  [place]. This information is not intended to replace advice given to you by your health care provider. Make sure you discuss any questions you have with your health care provider.

## 2014-10-05 MED ORDER — PREDNISONE 10 MG PO KIT: PACK | ORAL | Status: DC

## 2014-11-07 ENCOUNTER — Ambulatory Visit: Payer: 59 | Admitting: Podiatrist

## 2014-11-07 ENCOUNTER — Telehealth: Payer: Self-pay | Admitting: Oncology

## 2014-11-07 NOTE — Telephone Encounter (Signed)
due to schedule change 12/13/14 lb/AJ moved from 2:45pm to 2:15pm. lmonvm for pt and moved schedule.

## 2014-11-19 ENCOUNTER — Other Ambulatory Visit (HOSPITAL_BASED_OUTPATIENT_CLINIC_OR_DEPARTMENT_OTHER): Payer: 59

## 2014-11-19 ENCOUNTER — Telehealth: Payer: Self-pay | Admitting: *Deleted

## 2014-11-19 ENCOUNTER — Other Ambulatory Visit: Payer: Self-pay | Admitting: *Deleted

## 2014-11-19 DIAGNOSIS — D509 Iron deficiency anemia, unspecified: Secondary | ICD-10-CM

## 2014-11-19 DIAGNOSIS — D500 Iron deficiency anemia secondary to blood loss (chronic): Secondary | ICD-10-CM

## 2014-11-19 LAB — COMPREHENSIVE METABOLIC PANEL (CC13)
ALT: 18 U/L (ref 0–55)
AST: 20 U/L (ref 5–34)
Albumin: 3.7 g/dL (ref 3.5–5.0)
Alkaline Phosphatase: 58 U/L (ref 40–150)
Anion Gap: 11 mEq/L (ref 3–11)
BUN: 7.7 mg/dL (ref 7.0–26.0)
CALCIUM: 8.9 mg/dL (ref 8.4–10.4)
CHLORIDE: 105 meq/L (ref 98–109)
CO2: 24 mEq/L (ref 22–29)
CREATININE: 0.7 mg/dL (ref 0.6–1.1)
EGFR: >90 mL/min/{1.73_m2} (ref >90)
Glucose: 108 mg/dl (ref 70–140)
Potassium: 3.8 mEq/L (ref 3.5–5.1)
Sodium: 140 mEq/L (ref 136–145)
Total Bilirubin: 0.43 mg/dL (ref 0.20–1.20)
Total Protein: 6.9 g/dL (ref 6.4–8.3)

## 2014-11-19 LAB — CBC WITH DIFFERENTIAL/PLATELET
BASO%: 0.7 % (ref 0.0–2.0)
Basophils Absolute: 0 10*3/uL (ref 0.0–0.1)
EOS%: 1.4 % (ref 0.0–7.0)
Eosinophils Absolute: 0.1 10*3/uL (ref 0.0–0.5)
HCT: 38.5 % (ref 34.8–46.6)
HGB: 12.7 g/dL (ref 11.6–15.9)
LYMPH%: 29.4 % (ref 14.0–49.7)
MCH: 27.6 pg (ref 25.1–34.0)
MCHC: 33 g/dL (ref 31.5–36.0)
MCV: 83.7 fL (ref 79.5–101.0)
MONO#: 0.6 10*3/uL (ref 0.1–0.9)
MONO%: 10.4 % (ref 0.0–14.0)
NEUT#: 3.3 10*3/uL (ref 1.5–6.5)
NEUT%: 58.1 % (ref 38.4–76.8)
Platelets: 321 10*3/uL (ref 145–400)
RBC: 4.6 10*6/uL (ref 3.70–5.45)
RDW: 13 % (ref 11.2–14.5)
WBC: 5.7 10*3/uL (ref 3.9–10.3)
lymph#: 1.7 10*3/uL (ref 0.9–3.3)

## 2014-11-19 NOTE — Progress Notes (Signed)
Notified Symptom management clinic of patient c/o feeling terrible.  Verbal order received and read back from Selena Lesser NP to bring patient in today for labs.  Called patient and notified to come in for labs.

## 2014-11-19 NOTE — Telephone Encounter (Signed)
Patient called reporting she "feels like crap and would like labs today or sometime this week.  I can't wait till December 13, 2014."  Will notify Symptom Management Clinic.  Patient can be reached at [phone].

## 2014-11-20 ENCOUNTER — Other Ambulatory Visit: Payer: Self-pay | Admitting: Physician Assistant

## 2014-11-20 ENCOUNTER — Encounter: Payer: Self-pay | Admitting: *Deleted

## 2014-11-21 ENCOUNTER — Telehealth: Payer: Self-pay | Admitting: Oncology

## 2014-11-21 ENCOUNTER — Other Ambulatory Visit (HOSPITAL_BASED_OUTPATIENT_CLINIC_OR_DEPARTMENT_OTHER): Payer: 59

## 2014-11-21 DIAGNOSIS — D509 Iron deficiency anemia, unspecified: Secondary | ICD-10-CM

## 2014-11-21 DIAGNOSIS — D500 Iron deficiency anemia secondary to blood loss (chronic): Secondary | ICD-10-CM

## 2014-11-21 LAB — IRON AND TIBC CHCC
%SAT: 30 % (ref 21–57)
IRON: 99 ug/dL (ref 41–142)
TIBC: 329 ug/dL (ref 236–444)
UIBC: 230 ug/dL (ref 120–384)

## 2014-11-21 LAB — FERRITIN CHCC: FERRITIN: 21 ng/mL (ref 9–269)

## 2014-11-21 NOTE — Telephone Encounter (Signed)
s..w pt and added appt for today.Marland KitchenMarland KitchenMarland KitchenMarland Kitchenpt ok and aware

## 2014-11-22 ENCOUNTER — Telehealth: Payer: Self-pay | Admitting: Medical Oncology

## 2014-11-22 NOTE — Telephone Encounter (Signed)
Patient requesting results to labs drawn for Iron studies. Per MD, informed patient labs WNL. Denies questions at this time. Patient knows to contact office/clinic with further questions/concerns.

## 2014-12-12 ENCOUNTER — Telehealth: Payer: Self-pay | Admitting: Obstetrics & Gynecology

## 2014-12-12 NOTE — Telephone Encounter (Signed)
Pt is calling to talk with Janett Billow. Pt states she is still waiting to receive a call.

## 2014-12-13 ENCOUNTER — Other Ambulatory Visit: Payer: 59

## 2014-12-13 ENCOUNTER — Ambulatory Visit: Payer: 59 | Admitting: Physician Assistant

## 2014-12-13 ENCOUNTER — Other Ambulatory Visit: Payer: Self-pay | Admitting: *Deleted

## 2014-12-13 DIAGNOSIS — D509 Iron deficiency anemia, unspecified: Secondary | ICD-10-CM

## 2015-01-26 ENCOUNTER — Other Ambulatory Visit: Payer: Self-pay | Admitting: Obstetrics & Gynecology

## 2015-01-28 NOTE — Telephone Encounter (Signed)
Medication refill request: Vitamin D 50,000 ius Last AEX:  08/16/14 SM Next AEX: 10/03/15 with SM Last Vitamin D level checked: 08/16/14 at 26 Refill authorized: #[id number] rfs,please advise.  According to last vitamin d level checked patient had the option to take [place] or to restart vitamin d 50,000. Patient wanted to restart vitamin d 50,000 is taking it every other week, please advise.   (Routed to Dr. Quincy Simmonds since Dr. Sabra Heck is out of office.)

## 2015-04-06 IMAGING — CT CT ANGIO CHEST
2 of 9 series · 19 of 46 positions shown · IV contrast (APPLIED)
Comparison: Chest radiographs same date.

CLINICAL DATA: Acute severe chest pain with shortness of breath and
elevated D-dimer levels. Question pulmonary embolism.

EXAM:
CT ANGIOGRAPHY CHEST WITH CONTRAST
TECHNIQUE: Multidetector CT imaging of the chest was performed using the
standard protocol during bolus administration of intravenous
contrast. Multiplanar CT image reconstructions including MIPs were
obtained to evaluate the vascular anatomy.
CONTRAST:  100mL OMNIPAQUE IOHEXOL 350 MG/ML SOLN

[Series 5: thins · axial · 0.62mm/px · z∈[-328,-61]mm · 16 of 301 slices shown]
[im 17/301  lung]
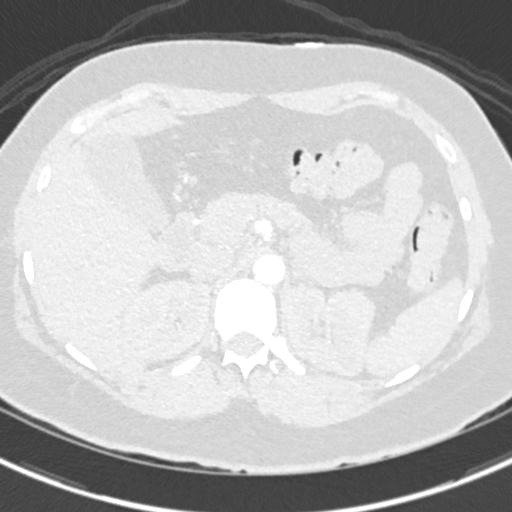
[im 34/301  soft-tissue]
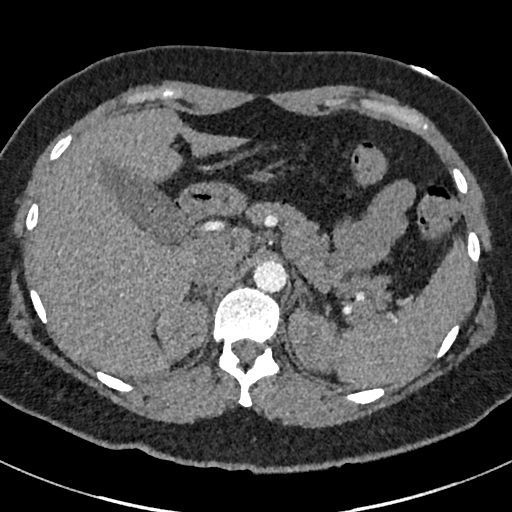
[im 51/301  lung]
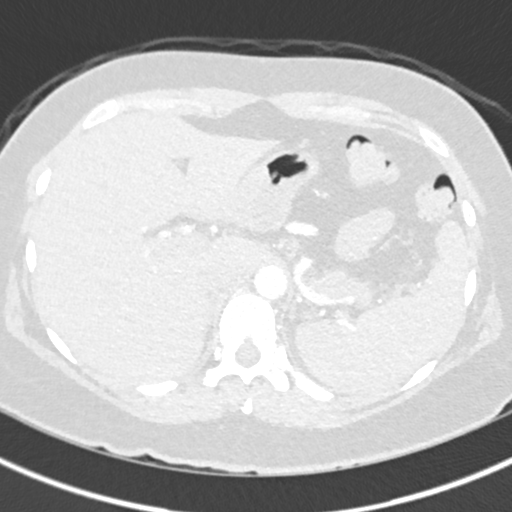
[im 67/301  soft-tissue]
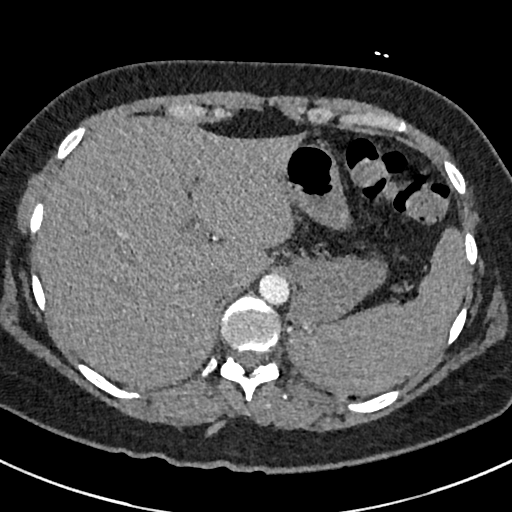
[im 84/301  lung]
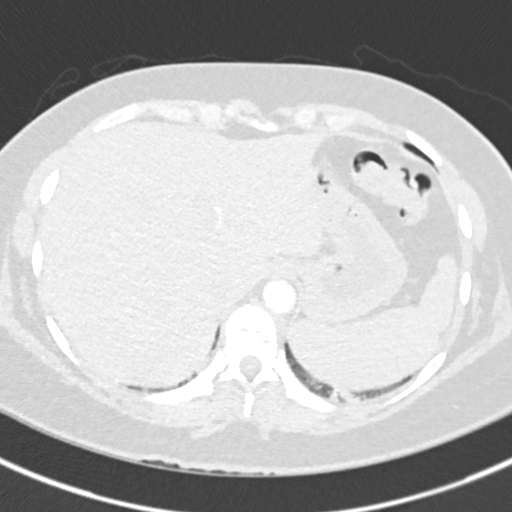
[im 101/301  soft-tissue]
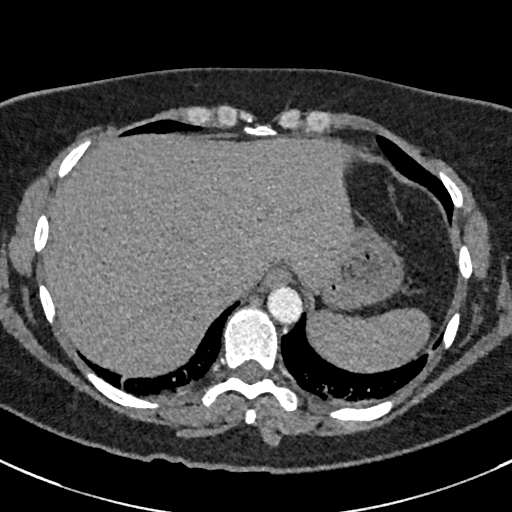
[im 117/301  lung]
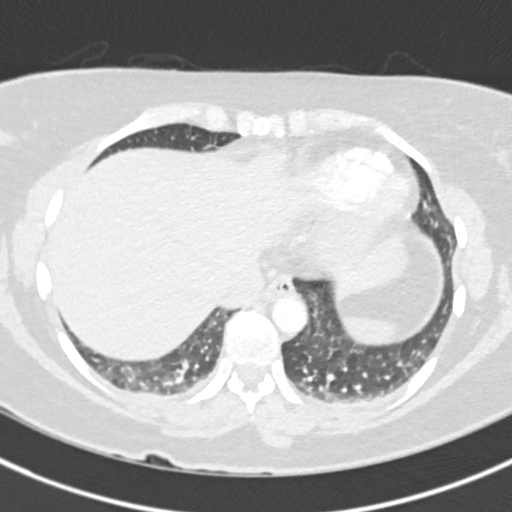
[im 134/301  soft-tissue]
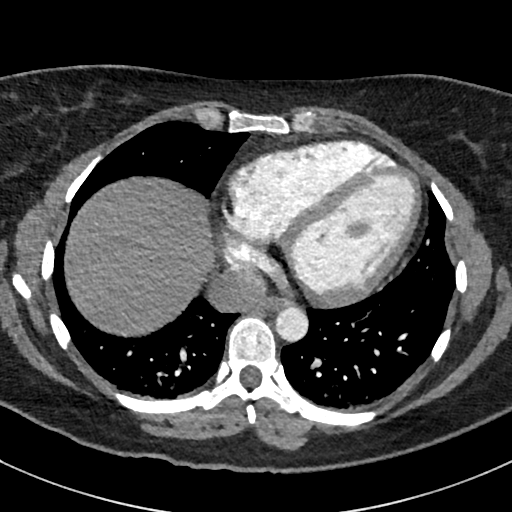
[im 167/301  lung]
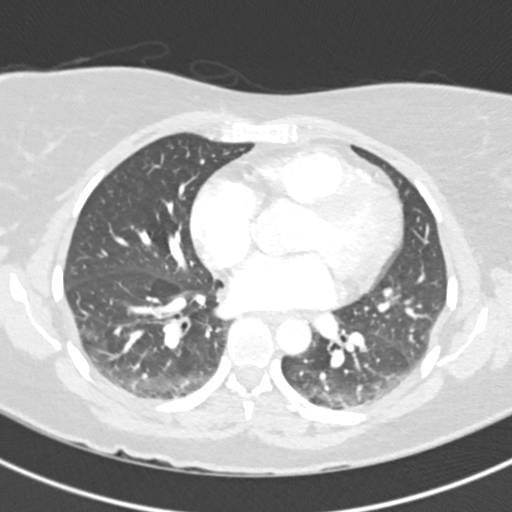
[im 184/301  soft-tissue]
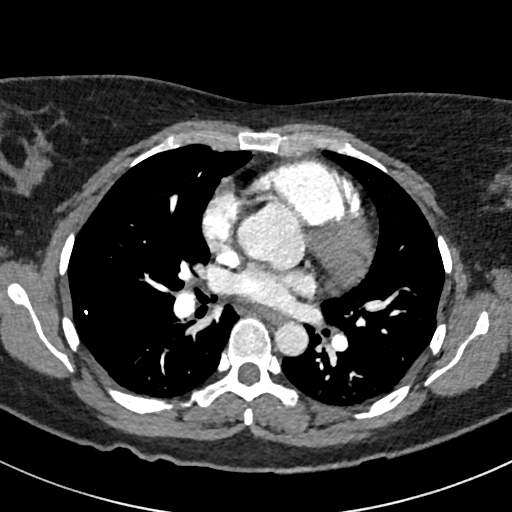
[im 201/301  lung]
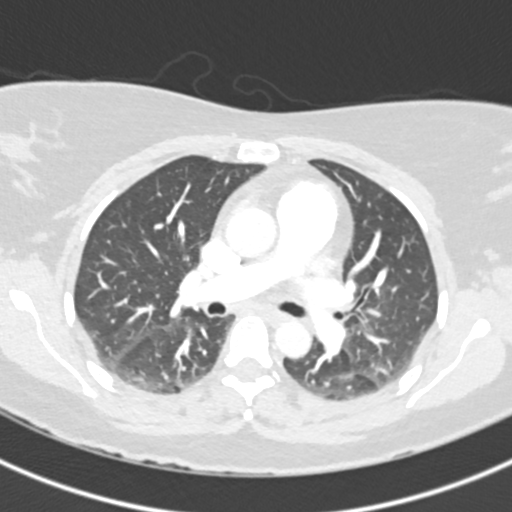
[im 217/301  soft-tissue]
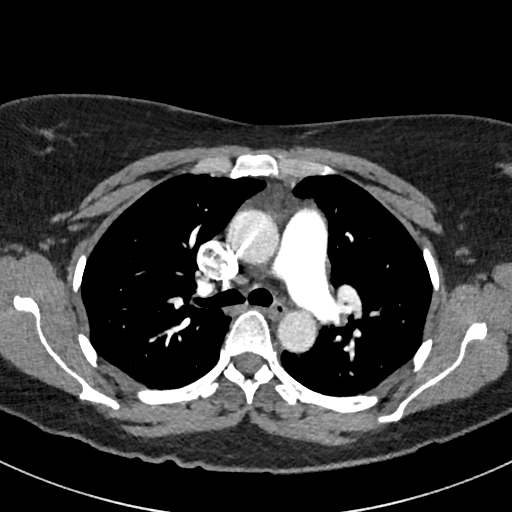
[im 234/301  lung]
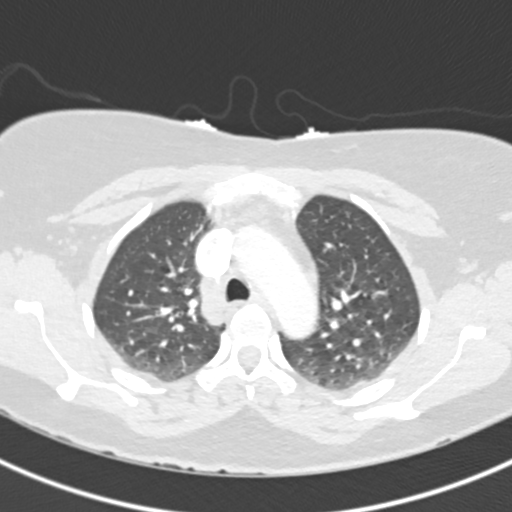
[im 251/301  soft-tissue]
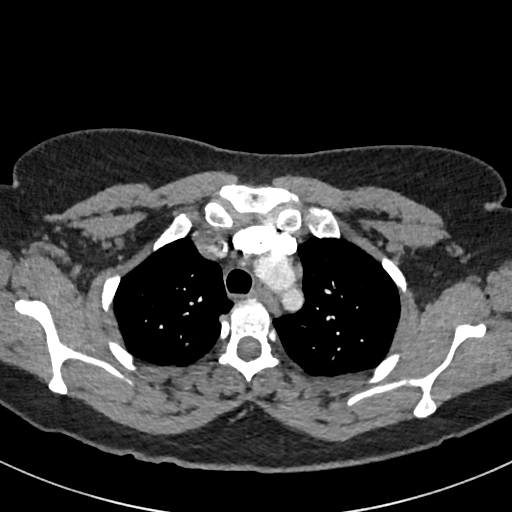
[im 267/301  lung]
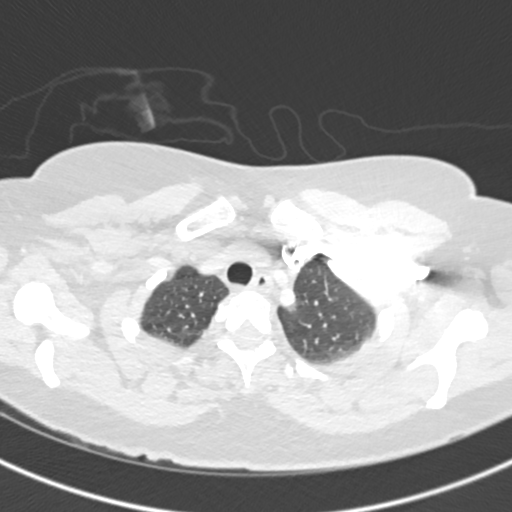
[im 284/301  soft-tissue]
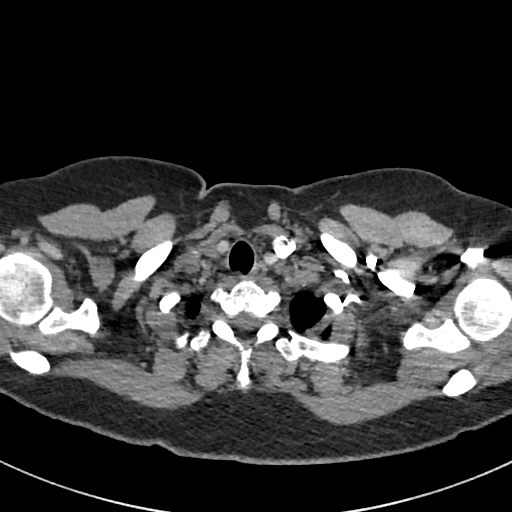

[Series 7: coronal mpr · coronal · 0.59mm/px · 3 of 151 slices shown]
[im 38/151  soft-tissue]
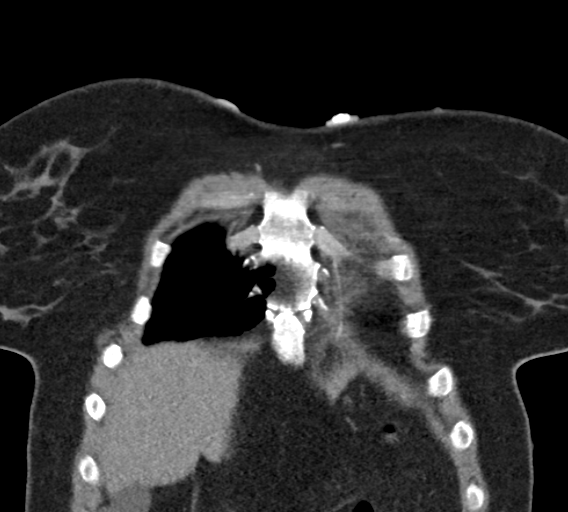
[im 76/151  soft-tissue]
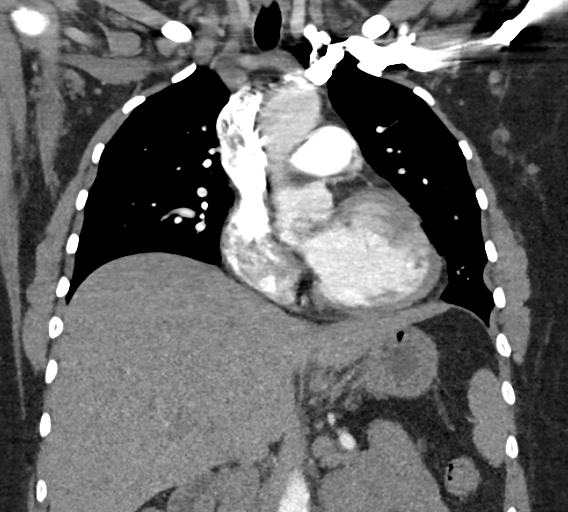
[im 113/151  soft-tissue]
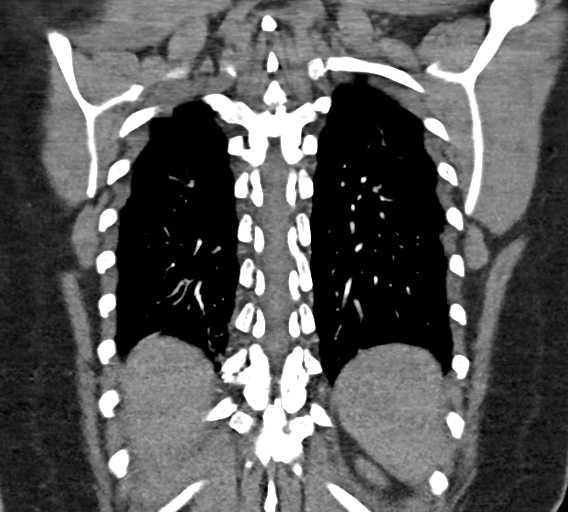

[19 of 46 positions shown; findings below may reference images not displayed]

FINDINGS: The pulmonary arteries are well opacified with contrast. There is no
evidence of acute pulmonary embolism. The thoracic aorta appears
normal. No significant atherosclerosis is demonstrated.

There is no pleural or pericardial effusion. There are no enlarged
mediastinal, hilar or axillary lymph nodes.

There is mild dependent atelectasis at both lung bases. There is a
small calcified right upper lobe granuloma. No confluent airspace
opacity or suspicious pulmonary nodule is demonstrated.

The visualized upper abdomen appears normal. No osseous
abnormalities are seen.

Review of the MIP images confirms the above findings.
IMPRESSION: No evidence of acute pulmonary embolism are other acute chest
process.

## 2015-08-19 ENCOUNTER — Other Ambulatory Visit: Payer: Self-pay

## 2015-08-19 DIAGNOSIS — Z1231 Encounter for screening mammogram for malignant neoplasm of breast: Secondary | ICD-10-CM

## 2015-10-03 ENCOUNTER — Ambulatory Visit (INDEPENDENT_AMBULATORY_CARE_PROVIDER_SITE_OTHER): Payer: 59 | Admitting: Obstetrics & Gynecology

## 2015-10-03 ENCOUNTER — Encounter: Payer: Self-pay | Admitting: Obstetrics & Gynecology

## 2015-10-03 VITALS — BP 122/88 | HR 64 | Resp 16 | Ht 70.5 in | Wt 257.0 lb

## 2015-10-03 DIAGNOSIS — L409 Psoriasis, unspecified: Secondary | ICD-10-CM | POA: Diagnosis not present

## 2015-10-03 DIAGNOSIS — Z124 Encounter for screening for malignant neoplasm of cervix: Secondary | ICD-10-CM

## 2015-10-03 DIAGNOSIS — Z0000 Encounter for general adult medical examination without abnormal findings: Secondary | ICD-10-CM | POA: Diagnosis not present

## 2015-10-03 DIAGNOSIS — Z01419 Encounter for gynecological examination (general) (routine) without abnormal findings: Secondary | ICD-10-CM

## 2015-10-03 LAB — POCT URINALYSIS DIPSTICK
BILIRUBIN UA: NEGATIVE
Glucose, UA: NEGATIVE
Ketones, UA: NEGATIVE
LEUKOCYTES UA: NEGATIVE
NITRITE UA: NEGATIVE
PH UA: 5
PROTEIN UA: NEGATIVE
UROBILINOGEN UA: NEGATIVE

## 2015-10-03 LAB — HEMOGLOBIN, FINGERSTICK: HEMOGLOBIN, FINGERSTICK: 13.2 g/dL (ref 12.0–16.0)

## 2015-10-03 NOTE — Progress Notes (Signed)
44 y.o. G0P0 SingleCaucasianF here for annual exam.  Doing well.  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  Pt is going to go back to school in January.  She is going to study recreational therapy and will start in January.  Retraining is being paid for by prior job.  Having some issues with her feet due to plantar fasciitis.  Has been seen.  Orthotics recommended but she thought the orthotics were very expensive.  Mother diagnosed with Stage 1 breast cancer.  Had lumpectomy and is going to need radiation only.  Pt is going to do 3D MMG now on.    She is on the end of her cycle today.  Reports flow has improved.  Not interested in any treatment.  Tried OCPs a couple of years ago but stopped medication.  Had labs done in early march.  Cholesterol was elevated.  Was advised to watch diet and work on weight loss.   Patient's last menstrual period was 09/29/2015.          Sexually active: No.  The current method of family planning is none.    Exercising: Yes.    walking Smoker:  no  Health Maintenance: Pap:  08/16/14 WNL History of abnormal Pap:  Yes 2009-HGSIL, CIN II MMG:  10/03/15 BiRads 1-negative.  Has appt tomorrow.  Colonoscopy:  3/09 BMD:   Heel test 10+years ago with work TDaP:  11/20/09 Screening Labs: PCP, Hb today: 13.2, Urine today: RBC-3+   reports that she has never smoked. She has never used smokeless tobacco. She reports that she drinks about 0.6 oz of alcohol per week. She reports that she does not use illicit drugs.  Past Medical History  Diagnosis Date  . Psoriasis   . STD (sexually transmitted disease)     HSV II  . Abnormal Pap smear     H/O CIN II  . Migraines   . Back pain   . Costochondritis     Past Surgical History  Procedure Laterality Date  . Ovarian cyst removal  2000  . Cryotherapy  2009    of cx  . Dermoid cyst  excision  1992    left ? ovary/appendectomy  . Urethral stretching  1993    Current Outpatient Prescriptions  Medication Sig Dispense Refill   . cyclobenzaprine (FLEXERIL) 5 MG tablet Take 1 tablet (5 mg total) by mouth 3 (three) times daily as needed (chest wall pain). 30 tablet 0  . Vitamin D, Ergocalciferol, (DRISDOL) 50000 UNITS CAPS capsule TAKE 1 CAPSULE (50,000 UNITS TOTAL) BY MOUTH EVERY 14 (FOURTEEN) DAYS. (Patient not taking: Reported on 10/03/2015) 12 capsule 1   No current facility-administered medications for this visit.    Family History  Problem Relation Age of Onset  . Thyroid disease Mother     and maternal side  . Diabetes Father   . Throat cancer Maternal Grandfather   . Cervical cancer Paternal Grandmother   . Hypertension Mother   . Heart attack Maternal Uncle   . Heart Problems Mother     heart arythmia  . Thrombocytopenia Sister     ITP  . Breast cancer Mother 66    had negative genetic testing    ROS:  Pertinent items are noted in HPI.  Otherwise, a comprehensive ROS was negative.  Exam:    General appearance: alert, cooperative and appears stated age Head: Normocephalic, without obvious abnormality, atraumatic Neck: no adenopathy, supple, symmetrical, trachea midline and thyroid normal to inspection and palpation Lungs:  clear to auscultation bilaterally Breasts: normal appearance, no masses or tenderness Heart: regular rate and rhythm Abdomen: soft, non-tender; bowel sounds normal; no masses,  no organomegaly Extremities: extremities normal, atraumatic, no cyanosis or edema Skin: Skin color, texture, turgor normal. No rashes or lesions Lymph nodes: Cervical, supraclavicular, and axillary nodes normal. No abnormal inguinal nodes palpated Neurologic: Grossly normal   Pelvic: External genitalia:  no lesions              Urethra:  normal appearing urethra with no masses, tenderness or lesions              Bartholins and Skenes: normal                 Vagina: normal appearing vagina with normal color and discharge, no lesions              Cervix: no lesions              Pap taken: Yes.    Bimanual Exam:  Uterus:  normal size, contour, position, consistency, mobility, non-tender              Adnexa: normal adnexa and no mass, fullness, tenderness               Rectovaginal: Confirms               Anus:  normal sphincter tone, no lesions  Chaperone was present for exam.  A:  Well Woman with normal exam  H/O HSV II  Low Vit D  Psoriasis  H/O menorrhagia.  Cycles are better this year.  Has tried OCPs in past. H/O Iron deficiency anemia. Followed by Dr. Alen Blew. Being seen yearly.  P: Mammogram yearly.  pap smear today. H/O CIN II 2009.  Not taking Valtrex at this time.  Will call if needs RF. Vit D today.  Was on 50K every two weeks but stopped.  May need to restart. return annually or prn

## 2015-10-04 ENCOUNTER — Ambulatory Visit
Admission: RE | Admit: 2015-10-04 | Discharge: 2015-10-04 | Disposition: A | Discharge status: Home / Self Care | Payer: 59 | Source: Ambulatory Visit

## 2015-10-04 DIAGNOSIS — Z1231 Encounter for screening mammogram for malignant neoplasm of breast: Secondary | ICD-10-CM

## 2015-10-04 LAB — VITAMIN D 25 HYDROXY (VIT D DEFICIENCY, FRACTURES): VIT D 25 HYDROXY: 18 ng/mL — AB (ref 30–100)

## 2015-10-04 MED ORDER — VITAMIN D (ERGOCALCIFEROL) 1.25 MG (50000 UNIT) PO CAPS: 50000.0000 [IU] | ORAL_CAPSULE | ORAL | Status: DC

## 2015-10-07 LAB — IPS PAP TEST WITH REFLEX TO HPV

## 2015-10-25 ENCOUNTER — Telehealth: Payer: Self-pay | Admitting: Obstetrics & Gynecology

## 2015-10-25 NOTE — Telephone Encounter (Signed)
Patient calling for mammogram results.

## 2015-10-25 NOTE — Telephone Encounter (Signed)
CLINICAL DATA: Screening.  EXAM: DIGITAL SCREENING BILATERAL MAMMOGRAM WITH 3D TOMO WITH CAD  COMPARISON: Previous exam(s).  ACR Breast Density Category c: The breast tissue is heterogeneously dense, which may obscure small masses.  FINDINGS: There are no findings suspicious for malignancy. Images were processed with CAD.  IMPRESSION: No mammographic evidence of malignancy. A result letter of this screening mammogram will be mailed directly to the patient.  RECOMMENDATION: Screening mammogram in one year. (Code:[id number])  BI-RADS CATEGORY 1: Negative.   Electronically Signed  By: Ammie Ferrier M.D.  On: 10/15/2015 15:03

## 2015-10-25 NOTE — Telephone Encounter (Signed)
Spoke with patient. Advised patient of mammogram results as seen below. Patient is agreeable and aware she will need a screening mammogram again in one year.  Routing to provider for final review. Patient agreeable to disposition. Will close encounter.

## 2016-01-09 ENCOUNTER — Other Ambulatory Visit: Payer: 59

## 2016-01-09 ENCOUNTER — Telehealth: Payer: Self-pay | Admitting: Obstetrics & Gynecology

## 2016-01-09 NOTE — Telephone Encounter (Signed)
Patient canceled her Vitamin D lab appointment. Patient will call later to reschedule.

## 2016-01-09 NOTE — Telephone Encounter (Signed)
Thank you.  Ok to close encounter.

## 2016-01-27 ENCOUNTER — Other Ambulatory Visit (INDEPENDENT_AMBULATORY_CARE_PROVIDER_SITE_OTHER): Payer: BLUE CROSS/BLUE SHIELD

## 2016-01-27 DIAGNOSIS — E559 Vitamin D deficiency, unspecified: Secondary | ICD-10-CM

## 2016-01-28 LAB — VITAMIN D 25 HYDROXY (VIT D DEFICIENCY, FRACTURES): VIT D 25 HYDROXY: 30 ng/mL (ref 30–100)

## 2016-01-30 ENCOUNTER — Telehealth: Payer: Self-pay | Admitting: *Deleted

## 2016-01-30 NOTE — Telephone Encounter (Signed)
Notes Recorded by Elroy Channel, CMA on 01/30/2016 at 1:12 PM LM for pt to call back. Notes Recorded by Megan Salon, MD on 01/28/2016 at 2:59 PM Please inform Vit D is much better. OK to continue RX of 50K weekly. Will recheck at AEX.

## 2016-01-31 NOTE — Telephone Encounter (Signed)
Pt notified. Verbalized understanding. Will call to get a refill request when she runs out. She still has refills at pharmacy.

## 2016-09-11 ENCOUNTER — Other Ambulatory Visit: Payer: Self-pay | Admitting: Obstetrics & Gynecology

## 2016-09-11 DIAGNOSIS — Z1231 Encounter for screening mammogram for malignant neoplasm of breast: Secondary | ICD-10-CM

## 2016-10-05 ENCOUNTER — Ambulatory Visit
Admission: RE | Admit: 2016-10-05 | Discharge: 2016-10-05 | Disposition: A | Discharge status: Home / Self Care | Payer: BLUE CROSS/BLUE SHIELD | Source: Ambulatory Visit | Attending: Obstetrics & Gynecology | Admitting: Obstetrics & Gynecology

## 2016-10-05 DIAGNOSIS — Z1231 Encounter for screening mammogram for malignant neoplasm of breast: Secondary | ICD-10-CM

## 2016-10-20 ENCOUNTER — Other Ambulatory Visit: Payer: Self-pay | Admitting: Obstetrics & Gynecology

## 2016-10-20 NOTE — Telephone Encounter (Signed)
Medication refill request: Vitamin D 50000 units Last AEX:  10/03/15 SM Next AEX: 01/19/17  Last MMG (if hormonal medication request): 10/05/16 BIRADS 1 negative Refill authorized: 10/04/15 #12 w/4 refills; today please advise; last Vit D check 01/27/16- 30 (will recheck at next AEX)

## 2017-01-01 ENCOUNTER — Ambulatory Visit (INDEPENDENT_AMBULATORY_CARE_PROVIDER_SITE_OTHER): Payer: BLUE CROSS/BLUE SHIELD | Admitting: Obstetrics & Gynecology

## 2017-01-01 ENCOUNTER — Encounter: Payer: Self-pay | Admitting: Obstetrics & Gynecology

## 2017-01-01 VITALS — BP 104/70 | HR 76 | Resp 12 | Ht 71.5 in | Wt 267.0 lb

## 2017-01-01 DIAGNOSIS — Z124 Encounter for screening for malignant neoplasm of cervix: Secondary | ICD-10-CM | POA: Diagnosis not present

## 2017-01-01 DIAGNOSIS — Z01419 Encounter for gynecological examination (general) (routine) without abnormal findings: Secondary | ICD-10-CM | POA: Diagnosis not present

## 2017-01-01 DIAGNOSIS — Z0000 Encounter for general adult medical examination without abnormal findings: Secondary | ICD-10-CM | POA: Diagnosis not present

## 2017-01-01 DIAGNOSIS — M722 Plantar fascial fibromatosis: Secondary | ICD-10-CM

## 2017-01-01 MED ORDER — VITAMIN D (ERGOCALCIFEROL) 1.25 MG (50000 UNIT) PO CAPS: 50000.0000 [IU] | ORAL_CAPSULE | ORAL | 4 refills | Status: DC

## 2017-01-01 NOTE — Progress Notes (Signed)
46 y.o. G0P0 Single Caucasian F here for annual exam.  Doing well except for plantar fasciitis.  Having more pain in her heels right now.  Has not followed up with podiatrist.    Cycles are still regular.    Finishing school this summer.  Will have Bachelor's degree this summer.  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  Thinking about doing 4 more hours to have a minor in gerontology.    Patient's last menstrual period was 12/10/2016.          Sexually active: No.  The current method of family planning is none.    Exercising: No.  The patient does not participate in regular exercise at present. Smoker:  no  Health Maintenance: Pap:  10/03/15 negative  History of abnormal Pap:  yes MMG:  10/07/16 BIRADS 1 negative  Colonoscopy:  02/2008  BMD:  Heels test in past  TDaP:  11/20/09  Pneumonia vaccine(s):  never Zostavax:   never Hep C testing: not indicated  Screening Labs: PCP and discuss with provider, Hb today: same, Urine today: declined   reports that she has never smoked. She has never used smokeless tobacco. She reports that she drinks about 0.6 oz of alcohol per week . She reports that she does not use drugs.  Past Medical History:  Diagnosis Date  . Abnormal Pap smear    H/O CIN II  . Back pain   . Costochondritis   . Migraines   . Psoriasis   . STD (sexually transmitted disease)    HSV II    Past Surgical History:  Procedure Laterality Date  . CRYOTHERAPY  2009   of cx  . DERMOID CYST  EXCISION  1992   left ? ovary/appendectomy  . OVARIAN CYST REMOVAL  2000  . urethral stretching  1993    Current Outpatient Prescriptions  Medication Sig Dispense Refill  . Vitamin D, Ergocalciferol, (DRISDOL) 50000 units CAPS capsule TAKE 1 CAPSULE (50,000 UNITS TOTAL) BY MOUTH EVERY 7 (SEVEN) DAYS. 12 capsule 0   No current facility-administered medications for this visit.     Family History  Problem Relation Age of Onset  . Thyroid disease Mother     and maternal side  .  Diabetes Father   . Throat cancer Maternal Grandfather   . Cervical cancer Paternal Grandmother   . Hypertension Mother   . Heart attack Maternal Uncle   . Heart Problems Mother     heart arythmia  . Thrombocytopenia Sister     ITP  . Breast cancer Mother 58    had negative genetic testing    ROS:  Pertinent items are noted in HPI.  Otherwise, a comprehensive ROS was negative.  Exam:   BP 104/70 (BP Location: Right Arm, Patient Position: Sitting, Cuff Size: Large)   Pulse 76   Resp 12   Ht 5' 11.5" (1.816 m)   Wt 267 lb (121.1 kg)   LMP 12/10/2016   BMI 36.72 kg/m   Weight change: +10#:   Height: 5' 11.5" (181.6 cm)  Ht Readings from Last 3 Encounters:  01/01/17 5' 11.5" (1.816 m)  10/03/15 5' 10.5" (1.791 m)  06/21/14 5' 10"  (1.778 m)   General appearance: alert, cooperative and appears stated age Head: Normocephalic, without obvious abnormality, atraumatic Neck: no adenopathy, supple, symmetrical, trachea midline and thyroid normal to inspection and palpation Lungs: clear to auscultation bilaterally Breasts: normal appearance, no masses or tenderness Heart: regular rate and rhythm Abdomen: soft, non-tender; bowel sounds normal;  no masses,  no organomegaly Extremities: extremities normal, atraumatic, no cyanosis or edema Skin: Skin color, texture, turgor normal. No rashes or lesions Lymph nodes: Cervical, supraclavicular, and axillary nodes normal. No abnormal inguinal nodes palpated Neurologic: Grossly normal   Pelvic: External genitalia:  no lesions              Urethra:  normal appearing urethra with no masses, tenderness or lesions              Bartholins and Skenes: normal                 Vagina: normal appearing vagina with normal color and discharge, no lesions              Cervix: no lesions              Pap taken: Yes.   Bimanual Exam:  Uterus:  normal size, contour, position, consistency, mobility, non-tender              Adnexa: normal adnexa and no mass,  fullness, tenderness               Rectovaginal: Confirms               Anus:  normal sphincter tone, no lesions  Chaperone was present for exam.  A:  Well Woman with normal exam  H/O HSV II  H/O Vit D Psoriasis H/O Iron deficiency anemia. Has been seen by hematology.  Last CBC was normal. H/O breast cancer in her mother  P:  Mammogram yearly.  H/O CIN II 2009.  Pap and HR HPV today.  Only takes Valtrex with outbreaks.  Hasn't used in years. Vit D 50k weekly.  Rx to pharmacy. CMP, Vit D, and lipids obtained today Return annually or prn

## 2017-01-02 LAB — COMPREHENSIVE METABOLIC PANEL
ALK PHOS: 64 U/L (ref 33–115)
ALT: 22 U/L (ref 6–29)
AST: 20 U/L (ref 10–30)
Albumin: 4 g/dL (ref 3.6–5.1)
BUN: 9 mg/dL (ref 7–25)
CALCIUM: 9.3 mg/dL (ref 8.6–10.2)
CHLORIDE: 102 mmol/L (ref 98–110)
CO2: 22 mmol/L (ref 20–31)
Creat: 0.61 mg/dL (ref 0.50–1.10)
GLUCOSE: 88 mg/dL (ref 65–99)
POTASSIUM: 4.5 mmol/L (ref 3.5–5.3)
Sodium: 137 mmol/L (ref 135–146)
Total Bilirubin: 0.5 mg/dL (ref 0.2–1.2)
Total Protein: 7.1 g/dL (ref 6.1–8.1)

## 2017-01-02 LAB — LIPID PANEL
CHOL/HDL RATIO: 4.6 ratio (ref <5.0)
Cholesterol: 227 mg/dL — ABNORMAL HIGH (ref <200)
HDL: 49 mg/dL — AB (ref >50)
LDL CALC: 155 mg/dL — AB (ref <100)
TRIGLYCERIDES: 114 mg/dL (ref <150)
VLDL: 23 mg/dL (ref <30)

## 2017-01-02 LAB — VITAMIN D 25 HYDROXY (VIT D DEFICIENCY, FRACTURES): Vit D, 25-Hydroxy: 33 ng/mL (ref 30–100)

## 2017-01-06 LAB — IPS PAP TEST WITH HPV

## 2017-01-15 ENCOUNTER — Other Ambulatory Visit: Payer: Self-pay | Admitting: Obstetrics and Gynecology

## 2017-01-19 ENCOUNTER — Ambulatory Visit: Payer: 59 | Admitting: Obstetrics & Gynecology

## 2017-01-27 ENCOUNTER — Encounter: Payer: Self-pay | Admitting: Podiatry

## 2017-01-27 ENCOUNTER — Ambulatory Visit (INDEPENDENT_AMBULATORY_CARE_PROVIDER_SITE_OTHER): Payer: BLUE CROSS/BLUE SHIELD

## 2017-01-27 ENCOUNTER — Ambulatory Visit (INDEPENDENT_AMBULATORY_CARE_PROVIDER_SITE_OTHER): Payer: BLUE CROSS/BLUE SHIELD | Admitting: Podiatry

## 2017-01-27 VITALS — BP 130/83 | HR 107 | Resp 16

## 2017-01-27 DIAGNOSIS — M779 Enthesopathy, unspecified: Secondary | ICD-10-CM

## 2017-01-27 DIAGNOSIS — M722 Plantar fascial fibromatosis: Secondary | ICD-10-CM

## 2017-01-27 DIAGNOSIS — M7661 Achilles tendinitis, right leg: Secondary | ICD-10-CM

## 2017-01-27 MED ORDER — TRIAMCINOLONE ACETONIDE 10 MG/ML IJ SUSP
10.0000 mg | Freq: Once | INTRAMUSCULAR | Status: AC
  Administered 2017-01-27: 10 mg

## 2017-01-27 MED ORDER — DICLOFENAC SODIUM 75 MG PO TBEC: 75.0000 mg | DELAYED_RELEASE_TABLET | Freq: Two times a day (BID) | ORAL | 2 refills | Status: DC

## 2017-01-27 NOTE — Patient Instructions (Addendum)
Plantar Fasciitis (Heel Spur Syndrome) with Rehab The plantar fascia is a fibrous, ligament-like, soft-tissue structure that spans the bottom of the foot. Plantar fasciitis is a condition that causes pain in the foot due to inflammation of the tissue. SYMPTOMS   Pain and tenderness on the underneath side of the foot.  Pain that worsens with standing or walking. CAUSES  Plantar fasciitis is caused by irritation and injury to the plantar fascia on the underneath side of the foot. Common mechanisms of injury include:  Direct trauma to bottom of the foot.  Damage to a small nerve that runs under the foot where the main fascia attaches to the heel bone.  Stress placed on the plantar fascia due to bone spurs. RISK INCREASES WITH:   Activities that place stress on the plantar fascia (running, jumping, pivoting, or cutting).  Poor strength and flexibility.  Improperly fitted shoes.  Tight calf muscles.  Flat feet.  Failure to warm-up properly before activity.  Obesity. PREVENTION  Warm up and stretch properly before activity.  Allow for adequate recovery between workouts.  Maintain physical fitness:  Strength, flexibility, and endurance.  Cardiovascular fitness.  Maintain a health body weight.  Avoid stress on the plantar fascia.  Wear properly fitted shoes, including arch supports for individuals who have flat feet.  PROGNOSIS  If treated properly, then the symptoms of plantar fasciitis usually resolve without surgery. However, occasionally surgery is necessary.  RELATED COMPLICATIONS   Recurrent symptoms that may result in a chronic condition.  Problems of the lower back that are caused by compensating for the injury, such as limping.  Pain or weakness of the foot during push-off following surgery.  Chronic inflammation, scarring, and partial or complete fascia tear, occurring more often from repeated injections.  TREATMENT  Treatment initially involves the  use of ice and medication to help reduce pain and inflammation. The use of strengthening and stretching exercises may help reduce pain with activity, especially stretches of the Achilles tendon. These exercises may be performed at home or with a therapist. Your caregiver may recommend that you use heel cups of arch supports to help reduce stress on the plantar fascia. Occasionally, corticosteroid injections are given to reduce inflammation. If symptoms persist for greater than 6 months despite non-surgical (conservative), then surgery may be recommended.   MEDICATION   If pain medication is necessary, then nonsteroidal anti-inflammatory medications, such as aspirin and ibuprofen, or other minor pain relievers, such as acetaminophen, are often recommended.  Do not take pain medication within 7 days before surgery.  Prescription pain relievers may be given if deemed necessary by your caregiver. Use only as directed and only as much as you need.  Corticosteroid injections may be given by your caregiver. These injections should be reserved for the most serious cases, because they may only be given a certain number of times.  HEAT AND COLD  Cold treatment (icing) relieves pain and reduces inflammation. Cold treatment should be applied for 10 to 15 minutes every 2 to 3 hours for inflammation and pain and immediately after any activity that aggravates your symptoms. Use ice packs or massage the area with a piece of ice (ice massage).  Heat treatment may be used prior to performing the stretching and strengthening activities prescribed by your caregiver, physical therapist, or athletic trainer. Use a heat pack or soak the injury in warm water.  SEEK IMMEDIATE MEDICAL CARE IF:  Treatment seems to offer no benefit, or the condition worsens.  Any medications   produce adverse side effects.  EXERCISES- RANGE OF MOTION (ROM) AND STRETCHING EXERCISES - Plantar Fasciitis (Heel Spur Syndrome) These exercises  may help you when beginning to rehabilitate your injury. Your symptoms may resolve with or without further involvement from your physician, physical therapist or athletic trainer. While completing these exercises, remember:   Restoring tissue flexibility helps normal motion to return to the joints. This allows healthier, less painful movement and activity.  An effective stretch should be held for at least 30 seconds.  A stretch should never be painful. You should only feel a gentle lengthening or release in the stretched tissue.  RANGE OF MOTION - Toe Extension, Flexion  Sit with your right / left leg crossed over your opposite knee.  Grasp your toes and gently pull them back toward the top of your foot. You should feel a stretch on the bottom of your toes and/or foot.  Hold this stretch for 10 seconds.  Now, gently pull your toes toward the bottom of your foot. You should feel a stretch on the top of your toes and or foot.  Hold this stretch for 10 seconds. Repeat  times. Complete this stretch 3 times per day.   RANGE OF MOTION - Ankle Dorsiflexion, Active Assisted  Remove shoes and sit on a chair that is preferably not on a carpeted surface.  Place right / left foot under knee. Extend your opposite leg for support.  Keeping your heel down, slide your right / left foot back toward the chair until you feel a stretch at your ankle or calf. If you do not feel a stretch, slide your bottom forward to the edge of the chair, while still keeping your heel down.  Hold this stretch for 10 seconds. Repeat 3 times. Complete this stretch 2 times per day.   STRETCH  Gastroc, Standing  Place hands on wall.  Extend right / left leg, keeping the front knee somewhat bent.  Slightly point your toes inward on your back foot.  Keeping your right / left heel on the floor and your knee straight, shift your weight toward the wall, not allowing your back to arch.  You should feel a gentle stretch  in the right / left calf. Hold this position for 10 seconds. Repeat 3 times. Complete this stretch 2 times per day.  STRETCH  Soleus, Standing  Place hands on wall.  Extend right / left leg, keeping the other knee somewhat bent.  Slightly point your toes inward on your back foot.  Keep your right / left heel on the floor, bend your back knee, and slightly shift your weight over the back leg so that you feel a gentle stretch deep in your back calf.  Hold this position for 10 seconds. Repeat 3 times. Complete this stretch 2 times per day.  STRETCH  Gastrocsoleus, Standing  Note: This exercise can place a lot of stress on your foot and ankle. Please complete this exercise only if specifically instructed by your caregiver.   Place the ball of your right / left foot on a step, keeping your other foot firmly on the same step.  Hold on to the wall or a rail for balance.  Slowly lift your other foot, allowing your body weight to press your heel down over the edge of the step.  You should feel a stretch in your right / left calf.  Hold this position for 10 seconds.  Repeat this exercise with a slight bend in your right /   left knee. Repeat 3 times. Complete this stretch 2 times per day.   STRENGTHENING EXERCISES - Plantar Fasciitis (Heel Spur Syndrome)  These exercises may help you when beginning to rehabilitate your injury. They may resolve your symptoms with or without further involvement from your physician, physical therapist or athletic trainer. While completing these exercises, remember:   Muscles can gain both the endurance and the strength needed for everyday activities through controlled exercises.  Complete these exercises as instructed by your physician, physical therapist or athletic trainer. Progress the resistance and repetitions only as guided.  STRENGTH - Towel Curls  Sit in a chair positioned on a non-carpeted surface.  Place your foot on a towel, keeping your heel  on the floor.  Pull the towel toward your heel by only curling your toes. Keep your heel on the floor. Repeat 3 times. Complete this exercise 2 times per day.  STRENGTH - Ankle Inversion  Secure one end of a rubber exercise band/tubing to a fixed object (table, pole). Loop the other end around your foot just before your toes.  Place your fists between your knees. This will focus your strengthening at your ankle.  Slowly, pull your big toe up and in, making sure the band/tubing is positioned to resist the entire motion.  Hold this position for 10 seconds.  Have your muscles resist the band/tubing as it slowly pulls your foot back to the starting position. Repeat 3 times. Complete this exercises 2 times per day.  Document Released: 11/23/2005 Document Revised: 02/15/2012 Document Reviewed: 03/07/2009 [facility] Patient Information 2014 [place], [place]. Achilles Tendinitis  with Rehab Achilles tendinitis is a disorder of the Achilles tendon. The Achilles tendon connects the large calf muscles (Gastrocnemius and Soleus) to the heel bone (calcaneus). This tendon is sometimes called the heel cord. It is important for pushing-off and standing on your toes and is important for walking, running, or jumping. Tendinitis is often caused by overuse and repetitive microtrauma. SYMPTOMS  Pain, tenderness, swelling, warmth, and redness may occur over the Achilles tendon even at rest.  Pain with pushing off, or flexing or extending the ankle.  Pain that is worsened after or during activity. CAUSES   Overuse sometimes seen with rapid increase in exercise programs or in sports requiring running and jumping.  Poor physical conditioning (strength and flexibility or endurance).  Running sports, especially training running down hills.  Inadequate warm-up before practice or play or failure to stretch before participation.  Injury to the tendon. PREVENTION   Warm up and stretch before practice or  competition.  Allow time for adequate rest and recovery between practices and competition.  Keep up conditioning.  Keep up ankle and leg flexibility.  Improve or keep muscle strength and endurance.  Improve cardiovascular fitness.  Use proper technique.  Use proper equipment (shoes, skates).  To help prevent recurrence, taping, protective strapping, or an adhesive bandage may be recommended for several weeks after healing is complete. PROGNOSIS   Recovery may take weeks to several months to heal.  Longer recovery is expected if symptoms have been prolonged.  Recovery is usually quicker if the inflammation is due to a direct blow as compared with overuse or sudden strain. RELATED COMPLICATIONS   Healing time will be prolonged if the condition is not correctly treated. The injury must be given plenty of time to heal.  Symptoms can reoccur if activity is resumed too soon.  Untreated, tendinitis may increase the risk of tendon rupture requiring additional time for recovery  and possibly surgery. TREATMENT   The first treatment consists of rest anti-inflammatory medication, and ice to relieve the pain.  Stretching and strengthening exercises after resolution of pain will likely help reduce the risk of recurrence. Referral to a physical therapist or athletic trainer for further evaluation and treatment may be helpful.  A walking boot or cast may be recommended to rest the Achilles tendon. This can help break the cycle of inflammation and microtrauma.  Arch supports (orthotics) may be prescribed or recommended by your caregiver as an adjunct to therapy and rest.  Surgery to remove the inflamed tendon lining or degenerated tendon tissue is rarely necessary and has shown less than predictable results. MEDICATION   Nonsteroidal anti-inflammatory medications, such as aspirin and ibuprofen, may be used for pain and inflammation relief. Do not take within 7 days before surgery. Take  these as directed by your caregiver. Contact your caregiver immediately if any bleeding, stomach upset, or signs of allergic reaction occur. Other minor pain relievers, such as acetaminophen, may also be used.  Pain relievers may be prescribed as necessary by your caregiver. Do not take prescription pain medication for longer than 4 to 7 days. Use only as directed and only as much as you need.  Cortisone injections are rarely indicated. Cortisone injections may weaken tendons and predispose to rupture. It is better to give the condition more time to heal than to use them. HEAT AND COLD  Cold is used to relieve pain and reduce inflammation for acute and chronic Achilles tendinitis. Cold should be applied for 10 to 15 minutes every 2 to 3 hours for inflammation and pain and immediately after any activity that aggravates your symptoms. Use ice packs or an ice massage.  Heat may be used before performing stretching and strengthening activities prescribed by your caregiver. Use a heat pack or a warm soak. SEEK MEDICAL CARE IF:  Symptoms get worse or do not improve in 2 weeks despite treatment.  New, unexplained symptoms develop. Drugs used in treatment may produce side effects.  EXERCISES:  RANGE OF MOTION (ROM) AND STRETCHING EXERCISES - Achilles Tendinitis  These exercises may help you when beginning to rehabilitate your injury. Your symptoms may resolve with or without further involvement from your physician, physical therapist or athletic trainer. While completing these exercises, remember:   Restoring tissue flexibility helps normal motion to return to the joints. This allows healthier, less painful movement and activity.  An effective stretch should be held for at least 30 seconds.  A stretch should never be painful. You should only feel a gentle lengthening or release in the stretched tissue.  STRETCH  Gastroc, Standing   Place hands on wall.  Extend right / left leg, keeping the  front knee somewhat bent.  Slightly point your toes inward on your back foot.  Keeping your right / left heel on the floor and your knee straight, shift your weight toward the wall, not allowing your back to arch.  You should feel a gentle stretch in the right / left calf. Hold this position for 10 seconds. Repeat 3 times. Complete this stretch 2 times per day.  STRETCH  Soleus, Standing   Place hands on wall.  Extend right / left leg, keeping the other knee somewhat bent.  Slightly point your toes inward on your back foot.  Keep your right / left heel on the floor, bend your back knee, and slightly shift your weight over the back leg so that you feel a  gentle stretch deep in your back calf.  Hold this position for 10 seconds. Repeat 3 times. Complete this stretch 2 times per day.  STRETCH  Gastrocsoleus, Standing  Note: This exercise can place a lot of stress on your foot and ankle. Please complete this exercise only if specifically instructed by your caregiver.   Place the ball of your right / left foot on a step, keeping your other foot firmly on the same step.  Hold on to the wall or a rail for balance.  Slowly lift your other foot, allowing your body weight to press your heel down over the edge of the step.  You should feel a stretch in your right / left calf.  Hold this position for 10 seconds.  Repeat this exercise with a slight bend in your knee. Repeat 3 times. Complete this stretch 2 times per day.   STRENGTHENING EXERCISES - Achilles Tendinitis These exercises may help you when beginning to rehabilitate your injury. They may resolve your symptoms with or without further involvement from your physician, physical therapist or athletic trainer. While completing these exercises, remember:   Muscles can gain both the endurance and the strength needed for everyday activities through controlled exercises.  Complete these exercises as instructed by your physician,  physical therapist or athletic trainer. Progress the resistance and repetitions only as guided.  You may experience muscle soreness or fatigue, but the pain or discomfort you are trying to eliminate should never worsen during these exercises. If this pain does worsen, stop and make certain you are following the directions exactly. If the pain is still present after adjustments, discontinue the exercise until you can discuss the trouble with your clinician.  STRENGTH - Plantar-flexors   Sit with your right / left leg extended. Holding onto both ends of a rubber exercise band/tubing, loop it around the ball of your foot. Keep a slight tension in the band.  Slowly push your toes away from you, pointing them downward.  Hold this position for 10 seconds. Return slowly, controlling the tension in the band/tubing. Repeat 3 times. Complete this exercise 2 times per day.   STRENGTH - Plantar-flexors   Stand with your feet shoulder width apart. Steady yourself with a wall or table using as little support as needed.  Keeping your weight evenly spread over the width of your feet, rise up on your toes.*  Hold this position for 10 seconds. Repeat 3 times. Complete this exercise 2 times per day.  *If this is too easy, shift your weight toward your right / left leg until you feel challenged. Ultimately, you may be asked to do this exercise with your right / left foot only.  STRENGTH  Plantar-flexors, Eccentric  Note: This exercise can place a lot of stress on your foot and ankle. Please complete this exercise only if specifically instructed by your caregiver.   Place the balls of your feet on a step. With your hands, use only enough support from a wall or rail to keep your balance.  Keep your knees straight and rise up on your toes.  Slowly shift your weight entirely to your right / left toes and pick up your opposite foot. Gently and with controlled movement, lower your weight through your right /  left foot so that your heel drops below the level of the step. You will feel a slight stretch in the back of your calf at the end position.  Use the healthy leg to help rise up onto  the balls of both feet, then lower weight only on the right / left leg again. Build up to 15 repetitions. Then progress to 3 consecutive sets of 15 repetitions.*  After completing the above exercise, complete the same exercise with a slight knee bend (about 30 degrees). Again, build up to 15 repetitions. Then progress to 3 consecutive sets of 15 repetitions.* Perform this exercise 2 times per day.  *When you easily complete 3 sets of 15, your physician, physical therapist or athletic trainer may advise you to add resistance by wearing a backpack filled with additional weight.  STRENGTH - Plantar Flexors, Seated   Sit on a chair that allows your feet to rest flat on the ground. If necessary, sit at the edge of the chair.  Keeping your toes firmly on the ground, lift your right / left heel as far as you can without increasing any discomfort in your ankle. Repeat 3 times. Complete this exercise 2 times a day.

## 2017-01-27 NOTE — Progress Notes (Signed)
Subjective:     Patient ID: Sandy [name], female   DOB: 12/28/1970, 46 y.o.   MRN: [id number]  HPI patient states she's had a lot of reoccurrence of pain in the plantar right heel at the insertional point of the tendon into the calcaneus and states also that the back of her heel has started to hurt quite a bit and she is concerned about this   Review of Systems     Objective:   Physical Exam Neurovascular status intact muscle strength adequate with patient found to have inflammation plantar aspect right heel with fluid buildup. Also noted to have pain in the posterior aspect of the right heel around the Achilles tendon insertion slightly proximal with fluid buildup around the tendon itself and then into the insertional point into the calcaneus    Assessment:     Acute plantar fasciitis right with Achilles tendinitis right    Plan:     H&P x-ray reviewed and for the plantar heel injected with 3 mg Kenalog 5 mg Xylocaine. I then discussed the Achilles tendinitis educating her on this and reviewed different exercises to stretch it and also utilizing heel lifts. Reappoint to recheck again and did dispense fascial brace and placed on diclofenac 75 mg twice a day  X-ray report indicates there is spur formation with no indications of stress fracture or arthritis

## 2017-02-10 ENCOUNTER — Encounter: Payer: Self-pay | Admitting: Podiatry

## 2017-02-10 ENCOUNTER — Ambulatory Visit (INDEPENDENT_AMBULATORY_CARE_PROVIDER_SITE_OTHER): Payer: BLUE CROSS/BLUE SHIELD | Admitting: Podiatry

## 2017-02-10 DIAGNOSIS — M722 Plantar fascial fibromatosis: Secondary | ICD-10-CM | POA: Diagnosis not present

## 2017-02-10 DIAGNOSIS — M7661 Achilles tendinitis, right leg: Secondary | ICD-10-CM | POA: Diagnosis not present

## 2017-02-10 NOTE — Patient Instructions (Signed)

## 2017-02-12 NOTE — Progress Notes (Signed)
Subjective:     Patient ID: Sandy [name], female   DOB: 1971/06/23, 46 y.o.   MRN: [id number]  HPI patient presents stating I'm improved but still having discomfort   Review of Systems     Objective:   Physical Exam Neurovascular status intact negative Homan sign was noted with patient's right foot improving with diminished inflammation pain upon palpation    Assessment:     Tendinitis right plantar fashion Achilles tendon that's present but improved    Plan:     H&P condition reviewed and recommended anti-inflammatories physical therapy stretching exercises and will be seen back as needed

## 2017-05-19 ENCOUNTER — Encounter: Payer: Self-pay | Admitting: Nurse Practitioner

## 2017-05-19 ENCOUNTER — Ambulatory Visit (INDEPENDENT_AMBULATORY_CARE_PROVIDER_SITE_OTHER): Payer: BLUE CROSS/BLUE SHIELD | Admitting: Nurse Practitioner

## 2017-05-19 VITALS — BP 100/70 | HR 60 | Ht 71.5 in | Wt 268.0 lb

## 2017-05-19 DIAGNOSIS — N6310 Unspecified lump in the right breast, unspecified quadrant: Secondary | ICD-10-CM

## 2017-05-19 NOTE — Patient Instructions (Signed)
Will get diagnostic study on 05/21/17

## 2017-05-19 NOTE — Progress Notes (Deleted)
Patient ID: Sandy [name], female   DOB: [birth year], 46 y.o.   MRN: [id number]  GYNECOLOGY  VISIT   HPI: 46 y.o.   Single  Caucasian  female   G0P0000 with Patient's last menstrual period was 04/29/2017 (exact date).   here for right breast pain and lump. Mother has history of breast cancer diagnosed in 2016.  GYNECOLOGIC HISTORY: Patient's last menstrual period was 04/29/2017 (exact date). Contraception:None, not SA Menopausal hormone therapy: N/A Last MMG: 10/05/16, 3D, Density Category B, Bi-Rads 1:  Negative        OB History    Gravida Para Term Preterm AB Living   0 0 0 0 0 0   SAB TAB Ectopic Multiple Live Births   0 0 0 0 0         Patient Active Problem List   Diagnosis Date Noted  . Plantar fasciitis 01/01/2017  . Psoriasis 10/03/2015    Past Medical History:  Diagnosis Date  . Abnormal Pap smear    H/O CIN II  . Back pain   . Costochondritis   . Migraines   . Psoriasis   . STD (sexually transmitted disease)    HSV II    Past Surgical History:  Procedure Laterality Date  . CRYOTHERAPY  2009   of cx  . DERMOID CYST  EXCISION  1992   left ? ovary/appendectomy  . OVARIAN CYST REMOVAL  2000  . urethral stretching  1993    Current Outpatient Prescriptions  Medication Sig Dispense Refill  . diclofenac (VOLTAREN) 75 MG EC tablet Take 1 tablet (75 mg total) by mouth 2 (two) times daily. 50 tablet 2  . Vitamin D, Ergocalciferol, (DRISDOL) 50000 units CAPS capsule Take 1 capsule (50,000 Units total) by mouth every 7 (seven) days. 12 capsule 4   No current facility-administered medications for this visit.      ALLERGIES: Codeine and Sulfa antibiotics  Family History  Problem Relation Age of Onset  . Thyroid disease Mother        and maternal side  . Hypertension Mother   . Heart Problems Mother        heart arythmia  . Breast cancer Mother 27       had negative genetic testing  . Diabetes Father   . Throat cancer Maternal Grandfather   . Cervical  cancer Paternal Grandmother   . Heart attack Maternal Uncle   . Thrombocytopenia Sister        ITP    Social History   Social History  . Marital status: Single    Spouse name: N/A  . Number of children: N/A  . Years of education: N/A   Occupational History  . Not on file.   Social History Main Topics  . Smoking status: Never Smoker  . Smokeless tobacco: Never Used  . Alcohol use 0.6 oz/week    1 Standard drinks or equivalent per week     Comment: 1 or less  . Drug use: No  . Sexual activity: Not Currently    Birth control/ protection: None   Other Topics Concern  . Not on file   Social History Narrative  . No narrative on file    Review of Systems  Genitourinary:       Breast lump Breast pain  All other systems reviewed and are negative.   PHYSICAL EXAMINATION:    BP 100/70 (BP Location: Right Arm, Patient Position: Sitting, Cuff Size: Large)   Pulse 60  Ht 5' 11.5" (1.816 m)   Wt 268 lb (121.6 kg)   LMP 04/29/2017 (Exact Date)   BMI 36.86 kg/m     General appearance: alert, cooperative and appears stated age Neck: no adenopathy, supple, symmetrical, trachea midline and thyroid {CHL AMB PHY EX THYROID NORM DEFAULT:[phone]::"normal to inspection and palpation"} Breasts: {Exam; breast:13139::"normal appearance, no masses or tenderness"} Abdomen: soft, non-tender; bowel sounds normal; no masses,  no organomegaly  Pelvic: External genitalia:  no lesions              Urethra:  normal appearing urethra with no masses, tenderness or lesions              Bartholins and Skenes: normal                 Vagina: normal appearing vagina with normal color and discharge, no lesions              Cervix: {CHL AMB PHY EX CERVIX NORM DEFAULT:[phone]::"no lesions"}              Bimanual Exam:  Uterus:  {CHL AMB PHY EX UTERUS NORM [phone]::"normal size, contour, position, consistency, mobility, non-tender"}              Adnexa: {CHL AMB PHY EX ADNEXA NO MASS  [phone]::"no mass, fullness, tenderness"}              Rectovaginal: {yes no:[phone]}.  Confirms.              Anus:  normal sphincter tone, no lesions  Chaperone was present for exam.  ASSESSMENT     PLAN    An After Visit Summary was printed and given to the patient.  *** minutes face to face time of which over 50% was spent in counseling.

## 2017-05-19 NOTE — Progress Notes (Signed)
   Subjective:   46 y.o. Single Caucasian female presents for evaluation of right breast tenderness. Onset of the symptoms was about a month. Patient sought evaluation because of breast tenderness and chest wall pain.  She has had a history of chest costal chondriasis but no flare in about a year.  This area feels different than that type of pain.  Now she can feel a little BB size tenderness.  Contributing factors include mom with breast CA who is BRCA negative and cervical cancer with MGM.   Denies anorexia, chills, fevers, malaise, night sweats, sweats and weight loss. Patient denies history of trauma, bites, or injuries.  Seemed be to localized right upper chest. Last mammogram was 10/05/16.  Previous evaluation has included right breast US 04/10/08 for a cyst right breast at 10 o'clock. 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   Review of Systems Pertinent items are noted in HPI. SUBJECTIVE   Objective:   General appearance: alert, cooperative, appears stated age and no distress Head: Normocephalic, without obvious abnormality, atraumatic Neck: thyroid not enlarged, symmetric, no tenderness/mass/nodules Back: symmetric, no curvature. ROM normal. No CVA tenderness. Lungs: clear to auscultation bilaterally Breasts: normal appearance, no masses or tenderness, on the left.  There is a point tenderness right breast at 1:00 position about 10 inches from the aereola.  This area is BB size and non mobile.  There are no lymph nodes.  Heart: regular rate and rhythm Abdomen: soft and non tender Skin:  Multiple area of psoriasis all over arms   Assessment:   ASSESSMENT:Patient is diagnosed with breast mass, likely benign.  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   Plan:   PLAN: The patient has a documented plan to follow with further care of diagnostic plan on 05/21/17 2. PLAN: FOLLOW as needed

## 2017-05-19 NOTE — Progress Notes (Signed)
Patient scheduled while in office. Spoke with Cherish at [facility]. Patient scheduled for right breast diagnostic MMG and [place] on 05/21/17 arriving at 2:50pm for 3:10pm appointment. Patient is agreeable to date and time.

## 2017-05-21 ENCOUNTER — Ambulatory Visit
Admission: RE | Admit: 2017-05-21 | Discharge: 2017-05-21 | Disposition: A | Discharge status: Home / Self Care | Payer: BLUE CROSS/BLUE SHIELD | Source: Ambulatory Visit | Attending: Nurse Practitioner | Admitting: Nurse Practitioner

## 2017-05-21 DIAGNOSIS — N6310 Unspecified lump in the right breast, unspecified quadrant: Secondary | ICD-10-CM

## 2017-05-21 NOTE — Progress Notes (Signed)
Encounter reviewed Jill Jertson, MD   

## 2018-04-05 ENCOUNTER — Ambulatory Visit: Payer: BLUE CROSS/BLUE SHIELD | Admitting: Obstetrics & Gynecology

## 2018-04-05 ENCOUNTER — Encounter

## 2019-09-01 ENCOUNTER — Other Ambulatory Visit: Payer: Self-pay | Admitting: Obstetrics & Gynecology

## 2019-09-01 ENCOUNTER — Other Ambulatory Visit: Payer: Self-pay

## 2019-09-01 DIAGNOSIS — Z1231 Encounter for screening mammogram for malignant neoplasm of breast: Secondary | ICD-10-CM

## 2019-09-05 ENCOUNTER — Other Ambulatory Visit (HOSPITAL_COMMUNITY)
Admission: RE | Admit: 2019-09-05 | Discharge: 2019-09-05 | Disposition: A | Discharge status: Home / Self Care | Payer: Managed Care, Other (non HMO) | Source: Ambulatory Visit | Attending: Obstetrics & Gynecology | Admitting: Obstetrics & Gynecology

## 2019-09-05 ENCOUNTER — Other Ambulatory Visit: Payer: Self-pay

## 2019-09-05 ENCOUNTER — Encounter: Payer: Self-pay | Admitting: Obstetrics & Gynecology

## 2019-09-05 ENCOUNTER — Ambulatory Visit (INDEPENDENT_AMBULATORY_CARE_PROVIDER_SITE_OTHER): Payer: Managed Care, Other (non HMO) | Admitting: Obstetrics & Gynecology

## 2019-09-05 VITALS — BP 110/60 | HR 76 | Temp 97.3°F | Ht 70.5 in | Wt 271.2 lb

## 2019-09-05 DIAGNOSIS — Z01419 Encounter for gynecological examination (general) (routine) without abnormal findings: Secondary | ICD-10-CM

## 2019-09-05 DIAGNOSIS — Z124 Encounter for screening for malignant neoplasm of cervix: Secondary | ICD-10-CM

## 2019-09-05 DIAGNOSIS — Z0000 Encounter for general adult medical examination without abnormal findings: Secondary | ICD-10-CM | POA: Diagnosis not present

## 2019-09-05 DIAGNOSIS — N871 Moderate cervical dysplasia: Secondary | ICD-10-CM | POA: Diagnosis not present

## 2019-09-05 DIAGNOSIS — D500 Iron deficiency anemia secondary to blood loss (chronic): Secondary | ICD-10-CM

## 2019-09-05 MED ORDER — VALACYCLOVIR HCL 500 MG PO TABS: 500.0000 mg | ORAL_TABLET | Freq: Every day | ORAL | 4 refills | Status: DC

## 2019-09-05 NOTE — Progress Notes (Signed)
48 y.o. G0P0000 Single White or Caucasian female here for annual exam.  Cycles are regular.  Last cycle 08/16/2019.  First three days are still heavy.  Does have a migraine the week before her cycle.  She is having increased HSV outbreaks.    Patient's last menstrual period was 08/16/2019 (approximate).          Sexually active: No.  Exercising: Yes.    walking Smoker:  no  Health Maintenance: Pap:  01/01/17 Neg. HR HPV:neg  10/03/15 neg  History of abnormal Pap:  Yes, LEEP CIN II 2009 MMG:  05/21/17 [place] Right BIRADS2:benign. Has appt 10/19/19. Colonoscopy:  2009 BMD:   Heels test  TDaP:  2010 Screening Labs: Here today - fasting    reports that she has never smoked. She has never used smokeless tobacco. She reports current alcohol use of about 1.0 standard drinks of alcohol per week. She reports that she does not use drugs.  Past Medical History:  Diagnosis Date  . Abnormal Pap smear    H/O CIN II  . Back pain   . Costochondritis   . Migraines   . Psoriasis   . STD (sexually transmitted disease)    HSV II    Past Surgical History:  Procedure Laterality Date  . CRYOTHERAPY  2009   of cx  . DERMOID CYST  EXCISION  1992   left ? ovary/appendectomy  . OVARIAN CYST REMOVAL  2000  . urethral stretching  1993    Current Outpatient Medications  Medication Sig Dispense Refill  . Cholecalciferol (VITAMIN D3) 125 MCG (5000 UT) CAPS Take by mouth daily.    . Multiple Vitamin (MULTIVITAMIN) tablet Take 1 tablet by mouth daily.     No current facility-administered medications for this visit.     Family History  Problem Relation Age of Onset  . Thyroid disease Mother        and maternal side  . Hypertension Mother   . Heart Problems Mother        heart arythmia  . Breast cancer Mother 80       had negative genetic testing  . Diabetes Father   . Throat cancer Maternal Grandfather   . Cervical cancer Paternal Grandmother   . Heart attack Maternal Uncle   . Thrombocytopenia  Sister        ITP    Review of Systems  Gastrointestinal:       Hernia ?  Musculoskeletal: Positive for arthralgias.  All other systems reviewed and are negative.   Exam:   BP 110/60   Pulse 76   Temp (!) 97.3 F (36.3 C) (Temporal)   Ht 5' 10.5" (1.791 m)   Wt 271 lb 3.2 oz (123 kg)   LMP 08/16/2019 (Approximate)   BMI 38.36 kg/m    Height: 5' 10.5" (179.1 cm)  Ht Readings from Last 3 Encounters:  09/05/19 5' 10.5" (1.791 m)  05/19/17 5' 11.5" (1.816 m)  01/01/17 5' 11.5" (1.816 m)    General appearance: alert, cooperative and appears stated age Head: Normocephalic, without obvious abnormality, atraumatic Neck: no adenopathy, supple, symmetrical, trachea midline and thyroid normal to inspection and palpation Lungs: clear to auscultation bilaterally Breasts: normal appearance, no masses or tenderness Heart: regular rate and rhythm Abdomen: soft, non-tender; bowel sounds normal; no masses,  no organomegaly Extremities: extremities normal, atraumatic, no cyanosis or edema Skin: Skin color, texture, turgor normal. No rashes or lesions Lymph nodes: Cervical, supraclavicular, and axillary nodes normal. No abnormal  inguinal nodes palpated Neurologic: Grossly normal   Pelvic: External genitalia:  no lesions              Urethra:  normal appearing urethra with no masses, tenderness or lesions              Bartholins and Skenes: normal                 Vagina: normal appearing vagina with normal color and discharge, no lesions              Cervix: no lesions              Pap taken: Yes.   Bimanual Exam:  Uterus:  normal size, contour, position, consistency, mobility, non-tender              Adnexa: normal adnexa and no mass, fullness, tenderness               Rectovaginal: Confirms               Anus:  normal sphincter tone, no lesions  Chaperone was present for exam.  A:  Well Woman with normal exam H/O HSV 2 H/O Vit D Psoriasis H/o iron deficiency anemia H/O breast  cancer in her mother Obesity Tyrer-Cusick model risk assessment lifetime risk is 20.3%  P:   Mammogram guidelines reviewed.  Doing 3D MMG.  Breast MRI discussed today.  Declines for now. pap smear and HR HPV obtained today H/o CIN II 2009 Valtrex 557m daily with BID x 3 days for outbreaks.  #90/4RF CMP, CBC, Lipids, TSH, Vit D Iron studies obtained today Colonoscopy at age [age]discussed Vaccine records obtained today Return annually or prn

## 2019-09-05 NOTE — Patient Instructions (Signed)
Dennard Nip, MD.  Medical weight loss

## 2019-09-06 LAB — COMPREHENSIVE METABOLIC PANEL
ALT: 17 IU/L (ref 0–32)
AST: 18 IU/L (ref 0–40)
Albumin/Globulin Ratio: 1.5 (ref 1.2–2.2)
Albumin: 4.1 g/dL (ref 3.8–4.8)
Alkaline Phosphatase: 73 IU/L (ref 39–117)
BUN/Creatinine Ratio: 22 (ref 9–23)
BUN: 13 mg/dL (ref 6–24)
Bilirubin Total: 0.5 mg/dL (ref 0.0–1.2)
CO2: 22 mmol/L (ref 20–29)
Calcium: 9.5 mg/dL (ref 8.7–10.2)
Chloride: 103 mmol/L (ref 96–106)
Creatinine, Ser: 0.58 mg/dL (ref 0.57–1.00)
GFR calc Af Amer: 127 mL/min/{1.73_m2} (ref >59)
GFR calc non Af Amer: 110 mL/min/{1.73_m2} (ref >59)
Globulin, Total: 2.8 g/dL (ref 1.5–4.5)
Glucose: 107 mg/dL — ABNORMAL HIGH (ref 65–99)
Potassium: 4.6 mmol/L (ref 3.5–5.2)
Sodium: 140 mmol/L (ref 134–144)
Total Protein: 6.9 g/dL (ref 6.0–8.5)

## 2019-09-06 LAB — CBC
Hematocrit: 35.5 % (ref 34.0–46.6)
Hemoglobin: 10.9 g/dL — ABNORMAL LOW (ref 11.1–15.9)
MCH: 22.9 pg — ABNORMAL LOW (ref 26.6–33.0)
MCHC: 30.7 g/dL — ABNORMAL LOW (ref 31.5–35.7)
MCV: 74 fL — ABNORMAL LOW (ref 79–97)
Platelets: 365 10*3/uL (ref 150–450)
RBC: 4.77 x10E6/uL (ref 3.77–5.28)
RDW: 15.4 % (ref 11.7–15.4)
WBC: 6.8 10*3/uL (ref 3.4–10.8)

## 2019-09-06 LAB — TSH: TSH: 1.85 u[IU]/mL (ref 0.450–4.500)

## 2019-09-06 LAB — LIPID PANEL
Chol/HDL Ratio: 4.6 ratio — ABNORMAL HIGH (ref 0.0–4.4)
Cholesterol, Total: 208 mg/dL — ABNORMAL HIGH (ref 100–199)
HDL: 45 mg/dL (ref >39)
LDL Chol Calc (NIH): 142 mg/dL — ABNORMAL HIGH (ref 0–99)
Triglycerides: 115 mg/dL (ref 0–149)
VLDL Cholesterol Cal: 21 mg/dL (ref 5–40)

## 2019-09-06 LAB — IRON,TIBC AND FERRITIN PANEL
Ferritin: 10 ng/mL — ABNORMAL LOW (ref 15–150)
Iron Saturation: 16 % (ref 15–55)
Iron: 63 ug/dL (ref 27–159)
Total Iron Binding Capacity: 394 ug/dL (ref 250–450)
UIBC: 331 ug/dL (ref 131–425)

## 2019-09-06 LAB — VITAMIN D 25 HYDROXY (VIT D DEFICIENCY, FRACTURES): Vit D, 25-Hydroxy: 26.5 ng/mL — ABNORMAL LOW (ref 30.0–100.0)

## 2019-09-07 ENCOUNTER — Telehealth: Payer: Self-pay | Admitting: Obstetrics & Gynecology

## 2019-09-07 DIAGNOSIS — D500 Iron deficiency anemia secondary to blood loss (chronic): Secondary | ICD-10-CM

## 2019-09-07 LAB — SPECIMEN STATUS REPORT

## 2019-09-07 LAB — HGB A1C W/O EAG: Hgb A1c MFr Bld: 5.8 % — ABNORMAL HIGH (ref 4.8–5.6)

## 2019-09-07 NOTE — Telephone Encounter (Signed)
Patient is returning a call to [place] .

## 2019-09-08 ENCOUNTER — Telehealth: Payer: Self-pay | Admitting: Family

## 2019-09-08 ENCOUNTER — Other Ambulatory Visit: Payer: Self-pay | Admitting: Obstetrics & Gynecology

## 2019-09-08 DIAGNOSIS — D500 Iron deficiency anemia secondary to blood loss (chronic): Secondary | ICD-10-CM

## 2019-09-08 LAB — CYTOLOGY - PAP
Diagnosis: NEGATIVE
High risk HPV: NEGATIVE

## 2019-09-08 NOTE — Telephone Encounter (Signed)
Spoke with patient. Patient is requesting to cancel referral to [facility] at [facility] with Dr. Marin Olp for iron infusions. Patient lives in [place]. Has seen Dr. Alen Blew at [facility] in [place] in the past, request referral to him. Advised patient I will place new referral and our office referral coordinator will f/u with scheduling. Patient verbalizes understanding and is agreeable.   Routing to [employer]   Cc: Dr. Sabra Heck

## 2019-09-08 NOTE — Telephone Encounter (Signed)
lmom to inform patient of new patient appt 10/16 at 1 pm

## 2019-09-08 NOTE — Telephone Encounter (Signed)
Patient is calling regarding transfusion appointment that is scheduled in [place]. Patient is requesting that the appointment be changed to [facility] or somewhere closer to her.

## 2019-09-11 ENCOUNTER — Other Ambulatory Visit: Payer: Self-pay | Admitting: Obstetrics & Gynecology

## 2019-09-11 DIAGNOSIS — R7303 Prediabetes: Secondary | ICD-10-CM

## 2019-09-11 DIAGNOSIS — E7800 Pure hypercholesterolemia, unspecified: Secondary | ICD-10-CM

## 2019-09-14 ENCOUNTER — Telehealth: Payer: Self-pay | Admitting: Oncology

## 2019-09-14 NOTE — Telephone Encounter (Signed)
Called and left msg.mailed printout  °

## 2019-09-22 ENCOUNTER — Ambulatory Visit: Payer: Managed Care, Other (non HMO) | Admitting: Family

## 2019-09-22 ENCOUNTER — Inpatient Hospital Stay: Payer: Managed Care, Other (non HMO)

## 2019-09-25 ENCOUNTER — Telehealth: Payer: Self-pay | Admitting: *Deleted

## 2019-09-25 NOTE — Telephone Encounter (Signed)
Patient notified.  States she will get Tdap this week with her job and will fax a copy to [place]. Aware she is probably due for third Hep B.  Encounter closed.

## 2019-09-25 NOTE — Telephone Encounter (Signed)
Left voicemail to call back.  Immunization records received. Tdap was last done 11/20/09 she is due after 11/21/19. Per report, looks like she needs 1 more Hep B.  Report in folder.

## 2019-10-10 ENCOUNTER — Inpatient Hospital Stay: Payer: Managed Care, Other (non HMO) | Attending: Oncology | Admitting: Oncology

## 2019-10-10 ENCOUNTER — Other Ambulatory Visit: Payer: Self-pay

## 2019-10-10 VITALS — BP 111/72 | HR 106 | Temp 98.2°F | Resp 18 | Ht 70.5 in | Wt 265.9 lb

## 2019-10-10 DIAGNOSIS — D509 Iron deficiency anemia, unspecified: Secondary | ICD-10-CM | POA: Diagnosis present

## 2019-10-10 NOTE — Progress Notes (Signed)
Hematology and Oncology Follow Up Visit  Sandy [name] [id number] 1971-11-12 48 y.o. 10/10/2019 3:35 PM  CC: Sandy [name]. Sandy [name], M.D.    Principle Diagnosis: This is a 48 year old woman with iron-deficiency anemia diagnosed in 2012 related to menstrual blood losses.  Prior Therapy: He is status post IV iron utilizing Feraheme previously.  Current therapy: Active surveillance.    Interim History: [name] returns today for a follow-up.  She is a pleasant 48 year old woman has been seen in the past for iron deficiency anemia.  She has been doing reasonably well for the last 5 years and started developing worsening fatigue and tiredness.  She has reported continuing menstrual blood losses and was found to have iron deficiency anemia based on laboratory testing in September 2020.  Since that time, she continues to have issues with fatigue and tiredness and has been intolerant to oral iron.  She reported lack of efficacy with oral iron in the past.  Patient denied any alteration mental status, neuropathy, confusion or dizziness.  Denies any headaches or lethargy.  Denies any night sweats, weight loss or changes in appetite.  Denied orthopnea, dyspnea on exertion or chest discomfort.  Denies shortness of breath, difficulty breathing hemoptysis or cough.  Denies any abdominal distention, nausea, early satiety or dyspepsia.  Denies any hematuria, frequency, dysuria or nocturia.  Denies any skin irritation, dryness or rash.  Denies any ecchymosis or petechiae.  Denies any lymphadenopathy or clotting.  Denies any heat or cold intolerance.  Denies any anxiety or depression.  Remaining review of system is negative.     .     Medications: Updated on review. Current Outpatient Medications  Medication Sig Dispense Refill  . Cholecalciferol (VITAMIN D3) 125 MCG (5000 UT) CAPS Take by mouth daily.    . Multiple Vitamin (MULTIVITAMIN) tablet Take 1 tablet by mouth daily.    . valACYclovir (VALTREX) 500  MG tablet Take 1 tablet (500 mg total) by mouth daily. Increase to bid x 3 days with any symptoms. 120 tablet 4   No current facility-administered medications for this visit.     Allergies:  Allergies  Allergen Reactions  . Codeine Nausea And Vomiting    Excessive Sweating  . Sulfa Antibiotics   . Tramadol Nausea And Vomiting    Past Medical History, Surgical history, Social history, and Family History without any changes on review.    Physical Exam: Blood pressure 111/72, pulse (!) 106, temperature 98.2 F (36.8 C), temperature source Temporal, resp. rate 18, height 5' 10.5" (1.791 m), weight 265 lb 14.4 oz (120.6 kg), SpO2 99 %.   ECOG: 0   General appearance: Comfortable appearing without any discomfort Head: Normocephalic without any trauma Oropharynx: Mucous membranes are moist and pink without any thrush or ulcers. Eyes: Pupils are equal and round reactive to light. Lymph nodes: No cervical, supraclavicular, inguinal or axillary lymphadenopathy.   Heart:regular rate and rhythm.  S1 and S2 without leg edema. Lung: Clear without any rhonchi or wheezes.  No dullness to percussion. Abdomin: Soft, nontender, nondistended with good bowel sounds.  No hepatosplenomegaly. Musculoskeletal: No joint deformity or effusion.  Full range of motion noted. Neurological: No deficits noted on motor, sensory and deep tendon reflex exam. Skin: No petechial rash or dryness.  Appeared moist.      Lab Results: Lab Results  Component Value Date   WBC 6.8 09/05/2019   HGB 10.9 (L) 09/05/2019   HCT 35.5 09/05/2019   MCV 74 (L) 09/05/2019   PLT  365 09/05/2019            Impression and Plan:   48 year old woman with:  1.  Iron deficiency anemia diagnosed in 2012 related to chronic menstrual blood losses and poor oral iron absorption.  Laboratory data obtained in September 2020 shows worsening of her iron deficiency with a saturation of 16 and ferritin level of 10.  Her  hemoglobin is 10.9 with MCV of 74.  Treatment options were reviewed today which includes repeat trial with oral iron although it has been not successful in the future.  Versus a Feraheme infusion.  Complication associated with this treatment including arthralgias, myalgias and infusion related complications.  After discussion today she is agreeable to proceed with Pap.  2.  Excessive fatigue or tiredness.  Likely related to her anemia but could also multifactorial in nature.  I urged her to continue to follow with her primary care physician regarding this issue.  3.  follow-up: Will be in 3 months for repeat evaluation.   15  minutes was spent with the patient face-to-face today.  More than 50% of time was spent on reviewing disease status update, treatment options and answering questions regarding future plan of care.  Lynne Leader 11/3/20203:35 PM

## 2019-10-11 ENCOUNTER — Telehealth: Payer: Self-pay | Admitting: Oncology

## 2019-10-11 NOTE — Telephone Encounter (Signed)
Scheduled per 11/04 los, patient is notified of 11/11 appointment and calender will be mailed.

## 2019-10-18 ENCOUNTER — Inpatient Hospital Stay: Payer: Managed Care, Other (non HMO)

## 2019-10-18 ENCOUNTER — Other Ambulatory Visit: Payer: Managed Care, Other (non HMO)

## 2019-10-18 ENCOUNTER — Other Ambulatory Visit: Payer: Self-pay

## 2019-10-18 ENCOUNTER — Ambulatory Visit: Payer: Managed Care, Other (non HMO)

## 2019-10-18 VITALS — BP 108/74 | HR 70 | Temp 98.2°F | Resp 18

## 2019-10-18 DIAGNOSIS — D509 Iron deficiency anemia, unspecified: Secondary | ICD-10-CM

## 2019-10-18 LAB — CBC WITH DIFFERENTIAL (CANCER CENTER ONLY)
Abs Immature Granulocytes: 0.03 10*3/uL (ref 0.00–0.07)
Basophils Absolute: 0 10*3/uL (ref 0.0–0.1)
Basophils Relative: 1 %
Eosinophils Absolute: 0.1 10*3/uL (ref 0.0–0.5)
Eosinophils Relative: 2 %
HCT: 37.2 % (ref 36.0–46.0)
Hemoglobin: 11.1 g/dL — ABNORMAL LOW (ref 12.0–15.0)
Immature Granulocytes: 1 %
Lymphocytes Relative: 23 %
Lymphs Abs: 1.4 10*3/uL (ref 0.7–4.0)
MCH: 22.7 pg — ABNORMAL LOW (ref 26.0–34.0)
MCHC: 29.8 g/dL — ABNORMAL LOW (ref 30.0–36.0)
MCV: 76.2 fL — ABNORMAL LOW (ref 80.0–100.0)
Monocytes Absolute: 0.7 10*3/uL (ref 0.1–1.0)
Monocytes Relative: 11 %
Neutro Abs: 3.8 10*3/uL (ref 1.7–7.7)
Neutrophils Relative %: 62 %
Platelet Count: 345 10*3/uL (ref 150–400)
RBC: 4.88 MIL/uL (ref 3.87–5.11)
RDW: 15.8 % — ABNORMAL HIGH (ref 11.5–15.5)
WBC Count: 6 10*3/uL (ref 4.0–10.5)
nRBC: 0 % (ref 0.0–0.2)

## 2019-10-18 LAB — IRON AND TIBC
Iron: 26 ug/dL — ABNORMAL LOW (ref 41–142)
Saturation Ratios: 7 % — ABNORMAL LOW (ref 21–57)
TIBC: 383 ug/dL (ref 236–444)
UIBC: 356 ug/dL (ref 120–384)

## 2019-10-18 LAB — FERRITIN: Ferritin: 4 ng/mL — ABNORMAL LOW (ref 11–307)

## 2019-10-18 MED ORDER — SODIUM CHLORIDE 0.9 % IV SOLN
510.0000 mg | Freq: Once | INTRAVENOUS | Status: AC
  Administered 2019-10-18: 510 mg via INTRAVENOUS
  Filled 2019-10-18: qty 17

## 2019-10-18 MED ORDER — SODIUM CHLORIDE 0.9 % IV SOLN
Freq: Once | INTRAVENOUS | Status: AC
  Administered 2019-10-18: 09:00:00 via INTRAVENOUS
  Filled 2019-10-18: qty 250

## 2019-10-18 NOTE — Patient Instructions (Signed)

## 2019-10-19 ENCOUNTER — Ambulatory Visit
Admission: RE | Admit: 2019-10-19 | Discharge: 2019-10-19 | Disposition: A | Discharge status: Home / Self Care | Payer: Managed Care, Other (non HMO) | Source: Ambulatory Visit | Attending: Obstetrics & Gynecology | Admitting: Obstetrics & Gynecology

## 2019-10-19 DIAGNOSIS — Z1231 Encounter for screening mammogram for malignant neoplasm of breast: Secondary | ICD-10-CM

## 2019-10-25 ENCOUNTER — Inpatient Hospital Stay: Payer: Managed Care, Other (non HMO)

## 2019-10-25 ENCOUNTER — Other Ambulatory Visit: Payer: Self-pay

## 2019-10-25 ENCOUNTER — Other Ambulatory Visit: Payer: Managed Care, Other (non HMO)

## 2019-10-25 VITALS — BP 112/73 | HR 70 | Temp 97.8°F | Resp 18

## 2019-10-25 DIAGNOSIS — D509 Iron deficiency anemia, unspecified: Secondary | ICD-10-CM | POA: Diagnosis not present

## 2019-10-25 MED ORDER — SODIUM CHLORIDE 0.9 % IV SOLN
510.0000 mg | Freq: Once | INTRAVENOUS | Status: AC
  Administered 2019-10-25: 510 mg via INTRAVENOUS
  Filled 2019-10-25: qty 510

## 2019-10-25 MED ORDER — SODIUM CHLORIDE 0.9 % IV SOLN
Freq: Once | INTRAVENOUS | Status: AC
  Administered 2019-10-25: 10:00:00 via INTRAVENOUS
  Filled 2019-10-25: qty 250

## 2019-10-25 NOTE — Patient Instructions (Signed)

## 2019-12-21 ENCOUNTER — Other Ambulatory Visit: Payer: Self-pay

## 2019-12-21 ENCOUNTER — Telehealth: Payer: Self-pay | Admitting: Obstetrics & Gynecology

## 2019-12-21 ENCOUNTER — Encounter: Payer: Self-pay | Admitting: Obstetrics & Gynecology

## 2019-12-21 ENCOUNTER — Ambulatory Visit: Payer: Managed Care, Other (non HMO) | Admitting: Obstetrics & Gynecology

## 2019-12-21 VITALS — BP 140/90 | HR 85 | Temp 98.2°F | Ht 71.0 in | Wt 266.0 lb

## 2019-12-21 DIAGNOSIS — R8290 Unspecified abnormal findings in urine: Secondary | ICD-10-CM

## 2019-12-21 DIAGNOSIS — N898 Other specified noninflammatory disorders of vagina: Secondary | ICD-10-CM

## 2019-12-21 DIAGNOSIS — M549 Dorsalgia, unspecified: Secondary | ICD-10-CM | POA: Diagnosis not present

## 2019-12-21 LAB — POCT URINALYSIS DIPSTICK
Blood, UA: NEGATIVE
Glucose, UA: NEGATIVE
Ketones, UA: NEGATIVE
Leukocytes, UA: NEGATIVE
Nitrite, UA: NEGATIVE
Protein, UA: NEGATIVE
Urobilinogen, UA: 0.2 E.U./dL
pH, UA: 5 (ref 5.0–8.0)

## 2019-12-21 MED ORDER — NITROFURANTOIN MONOHYD MACRO 100 MG PO CAPS: 100.0000 mg | ORAL_CAPSULE | Freq: Two times a day (BID) | ORAL | 0 refills | Status: DC

## 2019-12-21 NOTE — Progress Notes (Signed)
GYNECOLOGY  VISIT  HPI: 49 y.o. G0P0000 Single White or Caucasian female here for urinary symptoms with low back pain and odor in urine x 2 weeks.  She isn't having any dysuria but is having some "tingling" when she empties her bladder.  She does have urinary urgency.  She has noticed some increased frequency as well.  She is having some very mild moisture that she notices almost every day.  Cycles are regular.  She has a hx of heavy cycles and iron deficiency.  She had ferritin of 4 in November.  Had two iron infusions.  Has follow up 01/10/2020 with Dr. Alen Blew and will have blood work that day as well.  Denies pelvic pain and fever.  She does not think she is having any vaginal discharge.  Not SA.    GYNECOLOGIC HISTORY: LMP:  12/08/2019 Contraception: none Menopausal hormone therapy: none  Patient Active Problem List   Diagnosis Date Noted  . Iron deficiency anemia 10/10/2019  . Plantar fasciitis 01/01/2017  . Psoriasis 10/03/2015    Past Medical History:  Diagnosis Date  . Abnormal Pap smear    H/O CIN II  . Back pain   . Costochondritis   . Migraines   . Psoriasis   . STD (sexually transmitted disease)    HSV II    Past Surgical History:  Procedure Laterality Date  . CRYOTHERAPY  2009   of cx  . DERMOID CYST  EXCISION  1992   left ? ovary/appendectomy  . OVARIAN CYST REMOVAL  2000  . urethral stretching  1993    MEDS:   Current Outpatient Medications on File Prior to Visit  Medication Sig Dispense Refill  . Cholecalciferol (VITAMIN D3) 125 MCG (5000 UT) CAPS Take by mouth daily.    . Multiple Vitamin (MULTIVITAMIN) tablet Take 1 tablet by mouth daily.    . valACYclovir (VALTREX) 500 MG tablet Take 1 tablet (500 mg total) by mouth daily. Increase to bid x 3 days with any symptoms. 120 tablet 4   No current facility-administered medications on file prior to visit.    ALLERGIES: Codeine, Sulfa antibiotics, and Tramadol  Family History  Problem Relation Age of Onset   . Thyroid disease Mother        and maternal side  . Hypertension Mother   . Heart Problems Mother        heart arythmia  . Breast cancer Mother 70       had negative genetic testing  . Diabetes Father   . Throat cancer Maternal Grandfather   . Cervical cancer Paternal Grandmother   . Heart attack Maternal Uncle   . Thrombocytopenia Sister        ITP    SH:  Single, non tender  Review of Systems  Constitutional: Negative.   Gastrointestinal: Negative.   Genitourinary: Positive for urgency. Negative for dysuria, flank pain and vaginal discharge.  Musculoskeletal: Positive for back pain.    PHYSICAL EXAMINATION:   Vitals:   12/21/19 1601  BP: 140/90  Pulse: 85  Temp: 98.2 F (36.8 C)    General appearance: alert, cooperative and appears stated age Flank:  No CVA Abdomen: soft, non-tender; bowel sounds normal; no masses,  no organomegaly Lymph:  no inguinal LAD noted  Pelvic: External genitalia:  no lesions              Urethra:  normal appearing urethra with no masses, tenderness or lesions  Bartholins and Skenes: normal                 Vagina: normal appearing vagina with normal color and discharge, no lesions              Cervix: no lesions              Bimanual Exam:  Uterus:  normal size, contour, position, consistency, mobility, non-tender              Adnexa: no mass, fullness, tenderness  Chaperone, Evern Core, RN, was present for exam.  Assessment: Urine odor with urgency, frequency and mild dysuria Mild vaginal discharge  Plan: Urine culture pending Macrobid 111m bid x 5 days.  Rx to pharmacy.  Affirm

## 2019-12-21 NOTE — Addendum Note (Signed)
Addended by: Georgia Lopes on: 12/21/2019 04:56 PM   Modules accepted: Orders

## 2019-12-21 NOTE — Telephone Encounter (Signed)
Patient is having back pain and vaginal odor.

## 2019-12-21 NOTE — Telephone Encounter (Signed)
Spoke with patient. Patient reports lower back pain and odorous urine for 2 wks. States she does have hx of arthritis, but back pain is different. Urinary urgency and frequency, voids normal amounts. Denies dysuria, vaginal d/c, bleeding, fever/chills, N/V. [id number] prescreen negative, precautions reviewed. OV scheduled for today at 4pm with Dr. Sabra Heck.   Routing to provider for final review. Patient is agreeable to disposition. Will close encounter.

## 2019-12-22 ENCOUNTER — Telehealth: Payer: Self-pay

## 2019-12-22 LAB — VAGINITIS/VAGINOSIS, DNA PROBE
Candida Species: NEGATIVE
Gardnerella vaginalis: NEGATIVE
Trichomonas vaginosis: NEGATIVE

## 2019-12-22 MED ORDER — CIPROFLOXACIN HCL 500 MG PO TABS: 500.0000 mg | ORAL_TABLET | Freq: Two times a day (BID) | ORAL | 0 refills | Status: DC

## 2019-12-22 NOTE — Telephone Encounter (Signed)
-----   Message from Megan Salon, MD sent at 12/22/2019  1:04 PM EST ----- Please let pt know her vaginitis testing is negative.  The urine culture is still pending so keep taking the antibiotics.  Thanks.

## 2019-12-22 NOTE — Telephone Encounter (Signed)
Spoke with patient. Advised of results as seen below from [place]. Patient verbalizes understanding. Patient states that she is feeling worse today. Reports chills, increased back pain, odor to urine, and increased urgency. Denies fever. Patient has taken 2 Macrobid tablets thus far. Advised will review with Dr.Miller and return call with additional recommendations as her urine culture will likely take a few more days to process.

## 2019-12-22 NOTE — Telephone Encounter (Signed)
Spoke with patient. Advised of message as seen below from [place]. Patient is agreeable. Rx for Cipro 500 mg BID x 3 days #6 0RF sent to pharmacy on file. Advised of doctor on call if symptoms worsen over the weekend. Advised of ability to seek care at Urgent Care as well if needed over the weekend. Encounter closed.

## 2019-12-22 NOTE — Telephone Encounter (Signed)
Ok to switch to Cipro 522m bid x 3 days.  She is allergic to sulfa.

## 2019-12-23 LAB — URINE CULTURE

## 2019-12-25 ENCOUNTER — Telehealth: Payer: Self-pay | Admitting: Obstetrics & Gynecology

## 2019-12-25 NOTE — Telephone Encounter (Signed)
Spoke to pt. Pt given UC results. Happy the results are negative. Pt to follow up with PCP for back pain.   Will route to Dr Sabra Heck for review and close encounter.

## 2019-12-25 NOTE — Telephone Encounter (Signed)
Patient said she is returning a call to [employer]. No open phone note?

## 2020-01-10 ENCOUNTER — Inpatient Hospital Stay: Payer: Managed Care, Other (non HMO) | Attending: Oncology

## 2020-01-10 ENCOUNTER — Telehealth: Payer: Self-pay

## 2020-01-10 ENCOUNTER — Telehealth: Payer: Self-pay | Admitting: Oncology

## 2020-01-10 ENCOUNTER — Inpatient Hospital Stay (HOSPITAL_BASED_OUTPATIENT_CLINIC_OR_DEPARTMENT_OTHER): Payer: Managed Care, Other (non HMO) | Admitting: Oncology

## 2020-01-10 ENCOUNTER — Other Ambulatory Visit: Payer: Self-pay

## 2020-01-10 VITALS — BP 133/82 | HR 77 | Temp 97.2°F | Resp 17 | Ht 71.0 in | Wt 266.0 lb

## 2020-01-10 DIAGNOSIS — D500 Iron deficiency anemia secondary to blood loss (chronic): Secondary | ICD-10-CM | POA: Diagnosis present

## 2020-01-10 DIAGNOSIS — N920 Excessive and frequent menstruation with regular cycle: Secondary | ICD-10-CM | POA: Insufficient documentation

## 2020-01-10 DIAGNOSIS — D509 Iron deficiency anemia, unspecified: Secondary | ICD-10-CM | POA: Diagnosis not present

## 2020-01-10 LAB — IRON AND TIBC
Iron: 106 ug/dL (ref 41–142)
Saturation Ratios: 34 % (ref 21–57)
TIBC: 316 ug/dL (ref 236–444)
UIBC: 210 ug/dL (ref 120–384)

## 2020-01-10 LAB — CBC WITH DIFFERENTIAL (CANCER CENTER ONLY)
Abs Immature Granulocytes: 0.03 10*3/uL (ref 0.00–0.07)
Basophils Absolute: 0.1 10*3/uL (ref 0.0–0.1)
Basophils Relative: 1 %
Eosinophils Absolute: 0.2 10*3/uL (ref 0.0–0.5)
Eosinophils Relative: 2 %
HCT: 41 % (ref 36.0–46.0)
Hemoglobin: 13.2 g/dL (ref 12.0–15.0)
Immature Granulocytes: 1 %
Lymphocytes Relative: 24 %
Lymphs Abs: 1.6 10*3/uL (ref 0.7–4.0)
MCH: 26.8 pg (ref 26.0–34.0)
MCHC: 32.2 g/dL (ref 30.0–36.0)
MCV: 83.3 fL (ref 80.0–100.0)
Monocytes Absolute: 0.8 10*3/uL (ref 0.1–1.0)
Monocytes Relative: 12 %
Neutro Abs: 4 10*3/uL (ref 1.7–7.7)
Neutrophils Relative %: 60 %
Platelet Count: 304 10*3/uL (ref 150–400)
RBC: 4.92 MIL/uL (ref 3.87–5.11)
RDW: 16 % — ABNORMAL HIGH (ref 11.5–15.5)
WBC Count: 6.6 10*3/uL (ref 4.0–10.5)
nRBC: 0 % (ref 0.0–0.2)

## 2020-01-10 LAB — FERRITIN: Ferritin: 26 ng/mL (ref 11–307)

## 2020-01-10 NOTE — Telephone Encounter (Signed)
-----   Message from Wyatt Portela, MD sent at 01/10/2020 11:56 AM EST ----- Please let her know iron level is normal.

## 2020-01-10 NOTE — Telephone Encounter (Signed)
Called and informed patient of iron levels as listed below. Patient verbalized understanding.

## 2020-01-10 NOTE — Telephone Encounter (Signed)
Scheduled appt per 2/3 los.  Sent a message to HIM pool to get a calendar mailed out. 

## 2020-01-10 NOTE — Progress Notes (Signed)
Hematology and Oncology Follow Up Visit  Sandy [name] [id number] August 17, 1971 49 y.o. 01/10/2020 10:20 AM  CC: [name], M.D.    Principle Diagnosis:  49 year old woman with anemia related to chronic menstrual blood losses and iron-deficiency since 2012.  Current therapy: IV iron utilizing Feraheme given intermittently with the last treatment given November 2020.       Interim History: [name] is here for repeat evaluation.  Since her last visit, she did receive intravenous iron in November 2020 with excellent improvement in her overall performance status and activity level.  She denies any hematochezia, melena or hemoptysis.  She continues to have regular but heavy menstrual cycles.  She reports her cycles last about 7 days and heavy most of those days.  She denies any shortness of breath or difficulty breathing at this time.     .     Medications: Unchanged on review. Current Outpatient Medications  Medication Sig Dispense Refill  . Cholecalciferol (VITAMIN D3) 125 MCG (5000 UT) CAPS Take by mouth daily.    . ciprofloxacin (CIPRO) 500 MG tablet Take 1 tablet (500 mg total) by mouth 2 (two) times daily. 6 tablet 0  . Multiple Vitamin (MULTIVITAMIN) tablet Take 1 tablet by mouth daily.    . nitrofurantoin, macrocrystal-monohydrate, (MACROBID) 100 MG capsule Take 1 capsule (100 mg total) by mouth 2 (two) times daily. 10 capsule 0  . valACYclovir (VALTREX) 500 MG tablet Take 1 tablet (500 mg total) by mouth daily. Increase to bid x 3 days with any symptoms. 120 tablet 4   No current facility-administered medications for this visit.    Allergies:  Allergies  Allergen Reactions  . Codeine Nausea And Vomiting    Excessive Sweating  . Sulfa Antibiotics   . Tramadol Nausea And Vomiting        Physical Exam: Blood pressure 133/82, pulse 77, temperature (!) 97.2 F (36.2 C), temperature source Temporal, resp. rate 17, height 5' 11"  (1.803 m), weight 266 lb (120.7  kg), SpO2 98 %.   ECOG: 0   General appearance: Alert, awake without any distress. Head: Atraumatic without abnormalities Oropharynx: Without any thrush or ulcers. Eyes: No scleral icterus. Lymph nodes: No lymphadenopathy noted in the cervical, supraclavicular, or axillary nodes Heart:regular rate and rhythm, without any murmurs or gallops.   Lung: Clear to auscultation without any rhonchi, wheezes or dullness to percussion. Abdomin: Soft, nontender without any shifting dullness or ascites. Musculoskeletal: No clubbing or cyanosis. Neurological: No motor or sensory deficits. Skin: No rashes or lesions.      Lab Results: Lab Results  Component Value Date   WBC 6.6 01/10/2020   HGB 13.2 01/10/2020   HCT 41.0 01/10/2020   MCV 83.3 01/10/2020   PLT 304 01/10/2020          Results for [name], [name] (MRN [id number]) as of 01/10/2020 10:22  Ref. Range 10/18/2019 08:06  Iron Latest Ref Range: 41 - 142 ug/dL 26 (L)  UIBC Latest Ref Range: 120 - 384 ug/dL 356  TIBC Latest Ref Range: 236 - 444 ug/dL 383  Saturation Ratios Latest Ref Range: 21 - 57 % 7 (L)  Ferritin Latest Ref Range: 11 - 307 ng/mL 4 (L)     Impression and Plan:   49 year old woman with:  1.  Anemia diagnosed in 2012 related to iron deficiency from chronic menstrual blood losses.    Her hemoglobin today has improved on review although iron studies are currently pending.  Iron studies obtained  in November 2020 were reviewed again and discussed with the patient at this time.  Risks and benefits of proceeding with intravenous iron in the future were reviewed.  And she is agreeable to receive repeat iron if needed in the future.  She understands that the predominant source of her iron deficiency is chronic menstrual blood losses and she is addressing it with gynecology.  2.  Excessive fatigue or tiredness.  Resolved at this time after correcting her anemia.  3.  follow-up: In 3 months for repeat  follow-up.   20 minutes was dedicated to this encounter.  The time was spent on reviewing laboratory data, disease status update and treatment options.     Zola Button 2/3/202110:20 AM

## 2020-03-12 ENCOUNTER — Other Ambulatory Visit: Payer: Self-pay

## 2020-03-12 ENCOUNTER — Other Ambulatory Visit (INDEPENDENT_AMBULATORY_CARE_PROVIDER_SITE_OTHER): Payer: Managed Care, Other (non HMO)

## 2020-03-12 DIAGNOSIS — R7303 Prediabetes: Secondary | ICD-10-CM

## 2020-03-12 DIAGNOSIS — E7800 Pure hypercholesterolemia, unspecified: Secondary | ICD-10-CM

## 2020-03-13 LAB — LIPID PANEL
Chol/HDL Ratio: 4.6 ratio — ABNORMAL HIGH (ref 0.0–4.4)
Cholesterol, Total: 205 mg/dL — ABNORMAL HIGH (ref 100–199)
HDL: 45 mg/dL (ref >39)
LDL Chol Calc (NIH): 133 mg/dL — ABNORMAL HIGH (ref 0–99)
Triglycerides: 149 mg/dL (ref 0–149)
VLDL Cholesterol Cal: 27 mg/dL (ref 5–40)

## 2020-03-13 LAB — HEMOGLOBIN A1C
Est. average glucose Bld gHb Est-mCnc: 108 mg/dL
Hgb A1c MFr Bld: 5.4 % (ref 4.8–5.6)

## 2020-03-21 ENCOUNTER — Telehealth: Payer: Self-pay | Admitting: *Deleted

## 2020-03-21 NOTE — Telephone Encounter (Signed)
Patient would like to ask Dr. Sabra Heck to refill her migraine medication. [facility] at [phone].

## 2020-03-21 NOTE — Telephone Encounter (Signed)
Spoke with pt. Pt reports having migraines that start with cycle around 2 weeks before and throughout cycle. Pt states this began last month and has started again 2 weeks before menses this month.  Pt requesting migraine medication that she was prescribed in the past. No current migraine on file. Pt cannot remember the name of medication. Pt has been taking OTC Excedrin. Pt advised to have OV to be evaluated for migraines with menses. Offered multiple OV dates and times. Pt declines all OV dates and time. Pt states will continue to take OTC meds and will speak with employer and will call back to schedule appt.   Routing to Dr Sabra Heck for review. Encounter closed.

## 2020-03-22 NOTE — Telephone Encounter (Signed)
Ok to try imitrex 146m at HA onset and repeat in 2 hours if needed.  #9/1RF.  If uses, I'd like to know how much it helps.

## 2020-03-22 NOTE — Telephone Encounter (Signed)
Left detailed message per DPR to return call to triage RN.

## 2020-03-26 ENCOUNTER — Telehealth: Payer: Self-pay | Admitting: Obstetrics & Gynecology

## 2020-03-26 MED ORDER — SUMATRIPTAN SUCCINATE 100 MG PO TABS: 100.0000 mg | ORAL_TABLET | ORAL | 1 refills | Status: DC | PRN

## 2020-03-26 NOTE — Telephone Encounter (Signed)
Left detailed message per DPR and pt's request. Pt ok to use psoriasis meds while taking Imitrex. No interaction per Dr Sabra Heck verbally. Pt to call in a couple days with update on Imitrex. Pt agreeable.   Encounter closed.

## 2020-03-26 NOTE — Telephone Encounter (Signed)
Patient is returning a call to Velma from last Friday regarding her prescription.

## 2020-03-26 NOTE — Addendum Note (Signed)
Addended by: Georgia Lopes on: 03/26/2020 12:00 PM   Modules accepted: Orders

## 2020-03-26 NOTE — Telephone Encounter (Signed)
Encounter closed.  See phone encounter dated 03/21/20.

## 2020-03-26 NOTE — Telephone Encounter (Signed)
Pt returned call. Spoke with pt. Pt given recommendations per Dr Sabra Heck to try Imitrex Rx. Pt agreeable to try. Rx sent to pharmacy on file. Pt will pick up after work today and use at dinner. Pt still having headaches and Excedrin OTC not working.   Pt wanted to know if Imitrex will interact with new meds for flare up for Psoriasis? She is taking Derma- Smoothe lotion and triamcinolone cream 0/1%. Will review with Dr Sabra Heck and return call to pt. Ok to leave voicemail with answer.   Pt also reports having an early cycle again for 2 months in a row. Pt states LMP was 3/30 and now today 4/20. Pt still having her "normal" flow, but just early and with the headaches. Pt changing pads every 2-3 hours and having clots on 2nd day of cycle. Will update Dr Sabra Heck and get additional recommendations and return call to pt. Pt agreeable.   Routing to Dr Sabra Heck

## 2020-03-26 NOTE — Telephone Encounter (Signed)
Left message for pt to return call to triage RN.

## 2020-04-01 ENCOUNTER — Telehealth: Payer: Self-pay

## 2020-04-01 NOTE — Telephone Encounter (Signed)
Left message for pt to return call to triage RN.

## 2020-04-01 NOTE — Telephone Encounter (Signed)
Encounter closed

## 2020-04-01 NOTE — Telephone Encounter (Signed)
Thanks for update.  Ok to close encounter.

## 2020-04-01 NOTE — Telephone Encounter (Signed)
Pt returned call. Spoke with pt.  Pt given Rx for Imitrex on 4/20 for migraines. # 9, 1 RF.  LMP 03/26/20 Hx Iron def. Anemia    Pt calling to give update to Dr Sabra Heck.  Pt states did take Imitrex on Friday with migraine sx and it relieved her migraine after taking 2 tablets as needed per instructions. Pt states had lots of rest and sleep Friday and Saturday night. Pt states took 1 tablet of Imitrex for lingering headache on Sunday and sx were relieved.   Pt reports having "regular" manageable headache today Feeling much better than last week. Pt states will take OTC Excedrin while at work. Pt states still having some dizziness intermittently, but can function.  Pt denies nausea or vomiting.  Pt states heavy cycle bleeding over since weekend and just having light spotting today that she believes contributes to migraines.   Pt has next appt with Oncologist for labs and follow up on 04/09/20.  Will give update to Dr Sabra Heck and return call to pt with any additional recommdendations. Pt agreeable.   Routing to Dr Sabra Heck.

## 2020-04-01 NOTE — Telephone Encounter (Signed)
Patient called for [place]. Patient stated she did need to take the Imitrex for the migraines.

## 2020-04-09 ENCOUNTER — Inpatient Hospital Stay: Payer: Managed Care, Other (non HMO)

## 2020-04-09 ENCOUNTER — Inpatient Hospital Stay: Payer: Managed Care, Other (non HMO) | Attending: Oncology | Admitting: Oncology

## 2020-04-09 ENCOUNTER — Other Ambulatory Visit: Payer: Self-pay

## 2020-04-09 VITALS — BP 112/83 | HR 73 | Temp 98.5°F | Resp 17 | Ht 71.0 in | Wt 266.6 lb

## 2020-04-09 DIAGNOSIS — D509 Iron deficiency anemia, unspecified: Secondary | ICD-10-CM

## 2020-04-09 DIAGNOSIS — D500 Iron deficiency anemia secondary to blood loss (chronic): Secondary | ICD-10-CM | POA: Diagnosis present

## 2020-04-09 DIAGNOSIS — N920 Excessive and frequent menstruation with regular cycle: Secondary | ICD-10-CM | POA: Diagnosis not present

## 2020-04-09 LAB — CBC WITH DIFFERENTIAL (CANCER CENTER ONLY)
Abs Immature Granulocytes: 0.03 10*3/uL (ref 0.00–0.07)
Basophils Absolute: 0.1 10*3/uL (ref 0.0–0.1)
Basophils Relative: 1 %
Eosinophils Absolute: 0.1 10*3/uL (ref 0.0–0.5)
Eosinophils Relative: 1 %
HCT: 41.7 % (ref 36.0–46.0)
Hemoglobin: 13.3 g/dL (ref 12.0–15.0)
Immature Granulocytes: 1 %
Lymphocytes Relative: 25 %
Lymphs Abs: 1.4 10*3/uL (ref 0.7–4.0)
MCH: 27.9 pg (ref 26.0–34.0)
MCHC: 31.9 g/dL (ref 30.0–36.0)
MCV: 87.4 fL (ref 80.0–100.0)
Monocytes Absolute: 0.7 10*3/uL (ref 0.1–1.0)
Monocytes Relative: 12 %
Neutro Abs: 3.4 10*3/uL (ref 1.7–7.7)
Neutrophils Relative %: 60 %
Platelet Count: 317 10*3/uL (ref 150–400)
RBC: 4.77 MIL/uL (ref 3.87–5.11)
RDW: 12.7 % (ref 11.5–15.5)
WBC Count: 5.6 10*3/uL (ref 4.0–10.5)
nRBC: 0 % (ref 0.0–0.2)

## 2020-04-09 LAB — IRON AND TIBC
Iron: 55 ug/dL (ref 41–142)
Saturation Ratios: 16 % — ABNORMAL LOW (ref 21–57)
TIBC: 354 ug/dL (ref 236–444)
UIBC: 298 ug/dL (ref 120–384)

## 2020-04-09 LAB — FERRITIN: Ferritin: 11 ng/mL (ref 11–307)

## 2020-04-09 NOTE — Progress Notes (Signed)
Hematology and Oncology Follow Up Visit  Sandy [name] [id number] 11-24-1971 49 y.o. 04/09/2020 10:32 AM  CC: Sandy [name]. Sandy [name], M.D.    Principle Diagnosis:  49 year old woman with iron deficiency anemia diagnosed in 2012.  Her anemia is related to chronic menstrual blood losses.    Current therapy: IV iron utilizing Feraheme as needed.  Last treatment given in November 2020.  Oral iron therapy has been ineffective.     Interim History: Sandy [name] presents today for [name] follow-up visit.  Since the last visit, she reports feeling well without any major complications.  She denies any excessive fatigue tiredness or chest pain.  She denies any dictation or recent hospitalization.  Her performance status and quality of life remained excellent.  She denies any hematochezia or melena.  She denies any hemoptysis.  She continues to have regular menstrual cycles although they last around 5 to 7 days with heavy bleeding periodically.    .     Medications: Updated on review. Current Outpatient Medications  Medication Sig Dispense Refill  . Cholecalciferol (VITAMIN D3) 125 MCG (5000 UT) CAPS Take by mouth daily.    . ciprofloxacin (CIPRO) 500 MG tablet Take 1 tablet (500 mg total) by mouth 2 (two) times daily. 6 tablet 0  . DERMA-SMOOTHE/FS BODY 0.01 % OIL Apply  [name] small amount to affected area twice [name] day  for 1-2 weeks    . Multiple Vitamin (MULTIVITAMIN) tablet Take 1 tablet by mouth daily.    . nitrofurantoin, macrocrystal-monohydrate, (MACROBID) 100 MG capsule Take 1 capsule (100 mg total) by mouth 2 (two) times daily. 10 capsule 0  . SUMAtriptan (IMITREX) 100 MG tablet Take 1 tablet (100 mg total) by mouth every 2 (two) hours as needed for migraine. May repeat in 2 hours if headache persists or recurs. 9 tablet 1  . triamcinolone cream (KENALOG) 0.1 % SMARTSIG:1 Application Topical 2-3 Times Daily    . valACYclovir (VALTREX) 500 MG tablet Take 1 tablet (500 mg total) by mouth daily. Increase  to bid x 3 days with any symptoms. 120 tablet 4   No current facility-administered medications for this visit.    Allergies:  Allergies  Allergen Reactions  . Codeine Nausea And Vomiting    Excessive Sweating  . Sulfa Antibiotics   . Tramadol Nausea And Vomiting        Physical Exam: Blood pressure 112/83, pulse 73, temperature 98.5 F (36.9 C), temperature source Temporal, resp. rate 17, height 5' 11"  (1.803 m), weight 266 lb 9.6 oz (120.9 kg), SpO2 98 %.    ECOG: 0     General appearance: Comfortable appearing without any discomfort Head: Normocephalic without any trauma Oropharynx: Mucous membranes are moist and pink without any thrush or ulcers. Eyes: Pupils are equal and round reactive to light. Lymph nodes: No cervical, supraclavicular, inguinal or axillary lymphadenopathy.   Heart:regular rate and rhythm.  S1 and S2 without leg edema. Lung: Clear without any rhonchi or wheezes.  No dullness to percussion. Abdomin: Soft, nontender, nondistended with good bowel sounds.  No hepatosplenomegaly. Musculoskeletal: No joint deformity or effusion.  Full range of motion noted. Neurological: No deficits noted on motor, sensory and deep tendon reflex exam. Skin: No petechial rash or dryness.  Appeared moist.         Lab Results: Lab Results  Component Value Date   WBC 6.6 01/10/2020   HGB 13.2 01/10/2020   HCT 41.0 01/10/2020   MCV 83.3 01/10/2020   PLT 304 01/10/2020  Results for Sandy [name] (MRN [id number]) as of 04/09/2020 10:34  Ref. Range 01/10/2020 10:04  Iron Latest Ref Range: 41 - 142 ug/dL 106  UIBC Latest Ref Range: 120 - 384 ug/dL 210  TIBC Latest Ref Range: 236 - 444 ug/dL 316  Saturation Ratios Latest Ref Range: 21 - 57 % 34  Ferritin Latest Ref Range: 11 - 307 ng/mL 26     Impression and Plan:   49 year old woman with:  1.  Iron deficiency anemia related to chronic menstrual blood losses diagnosed in 2012.   She is status  post IV iron infusion in November 2021 with normalization of her hemoglobin and iron studies in February 2021.  The natural course of this disease was reviewed and risks and benefits of repeat intravenous iron were discussed.  Complications associated with repeat intravenous iron were reviewed.  These would include arthralgias, myalgias and infusion related complications.  The role of oral iron was also discussed.  He is currently taking multivitamin with iron supplement and it seems to tolerate it reasonably okay.   Laboratory data from today continues to show normal hemoglobin and I recommended continued oral iron replacement and reserve IV iron if she develops worsening anemia.   2.  Heavy menstrual cycles: I recommended follow-up with gynecology regarding this issue.  She has routine follow-up at this time.  3.  follow-up: Will be in 4 to 6 months for repeat follow-up.   30 minutes were spent on this visit.  The time was dedicated to reviewing laboratory data, discussing treatment options and complications.    Sandy [name] 5/4/202110:32 AM

## 2020-04-10 ENCOUNTER — Telehealth: Payer: Self-pay | Admitting: Oncology

## 2020-04-10 NOTE — Telephone Encounter (Signed)
Scheduled appt per 5/4 los.  Left a vm of the appt date and time.

## 2020-07-14 ENCOUNTER — Other Ambulatory Visit: Payer: Self-pay

## 2020-07-14 ENCOUNTER — Emergency Department (HOSPITAL_COMMUNITY)
Admission: EM | Admit: 2020-07-14 | Discharge: 2020-07-14 | Disposition: A | Discharge status: Home / Self Care | Payer: Managed Care, Other (non HMO) | Attending: Emergency Medicine | Admitting: Emergency Medicine

## 2020-07-14 ENCOUNTER — Emergency Department (HOSPITAL_COMMUNITY): Payer: Managed Care, Other (non HMO)

## 2020-07-14 ENCOUNTER — Encounter (HOSPITAL_COMMUNITY): Payer: Self-pay | Admitting: *Deleted

## 2020-07-14 DIAGNOSIS — M545 Low back pain: Secondary | ICD-10-CM | POA: Diagnosis not present

## 2020-07-14 DIAGNOSIS — Z79899 Other long term (current) drug therapy: Secondary | ICD-10-CM | POA: Diagnosis not present

## 2020-07-14 DIAGNOSIS — K8050 Calculus of bile duct without cholangitis or cholecystitis without obstruction: Secondary | ICD-10-CM

## 2020-07-14 DIAGNOSIS — R42 Dizziness and giddiness: Secondary | ICD-10-CM | POA: Insufficient documentation

## 2020-07-14 DIAGNOSIS — K8051 Calculus of bile duct without cholangitis or cholecystitis with obstruction: Secondary | ICD-10-CM | POA: Diagnosis not present

## 2020-07-14 DIAGNOSIS — K8020 Calculus of gallbladder without cholecystitis without obstruction: Secondary | ICD-10-CM | POA: Diagnosis not present

## 2020-07-14 DIAGNOSIS — M25511 Pain in right shoulder: Secondary | ICD-10-CM | POA: Diagnosis not present

## 2020-07-14 DIAGNOSIS — R079 Chest pain, unspecified: Secondary | ICD-10-CM | POA: Diagnosis present

## 2020-07-14 DIAGNOSIS — M549 Dorsalgia, unspecified: Secondary | ICD-10-CM

## 2020-07-14 LAB — URINALYSIS, ROUTINE W REFLEX MICROSCOPIC
Bilirubin Urine: NEGATIVE
Glucose, UA: NEGATIVE mg/dL
Hgb urine dipstick: NEGATIVE
Ketones, ur: NEGATIVE mg/dL
Leukocytes,Ua: NEGATIVE
Nitrite: NEGATIVE
Protein, ur: NEGATIVE mg/dL
Specific Gravity, Urine: 1.006 (ref 1.005–1.030)
pH: 8 (ref 5.0–8.0)

## 2020-07-14 LAB — BASIC METABOLIC PANEL
Anion gap: 11 (ref 5–15)
BUN: 14 mg/dL (ref 6–20)
CO2: 23 mmol/L (ref 22–32)
Calcium: 9.4 mg/dL (ref 8.9–10.3)
Chloride: 104 mmol/L (ref 98–111)
Creatinine, Ser: 0.67 mg/dL (ref 0.44–1.00)
GFR calc Af Amer: >60 mL/min (ref >60)
GFR calc non Af Amer: >60 mL/min (ref >60)
Glucose, Bld: 121 mg/dL — ABNORMAL HIGH (ref 70–99)
Potassium: 4.2 mmol/L (ref 3.5–5.1)
Sodium: 138 mmol/L (ref 135–145)

## 2020-07-14 LAB — HEPATIC FUNCTION PANEL
ALT: 20 U/L (ref 0–44)
AST: 19 U/L (ref 15–41)
Albumin: 3.4 g/dL — ABNORMAL LOW (ref 3.5–5.0)
Alkaline Phosphatase: 52 U/L (ref 38–126)
Bilirubin, Direct: <0.1 mg/dL (ref 0.0–0.2)
Total Bilirubin: 0.6 mg/dL (ref 0.3–1.2)
Total Protein: 6.7 g/dL (ref 6.5–8.1)

## 2020-07-14 LAB — CBC
HCT: 41.4 % (ref 36.0–46.0)
Hemoglobin: 12.9 g/dL (ref 12.0–15.0)
MCH: 26.9 pg (ref 26.0–34.0)
MCHC: 31.2 g/dL (ref 30.0–36.0)
MCV: 86.3 fL (ref 80.0–100.0)
Platelets: 287 10*3/uL (ref 150–400)
RBC: 4.8 MIL/uL (ref 3.87–5.11)
RDW: 13 % (ref 11.5–15.5)
WBC: 8.1 10*3/uL (ref 4.0–10.5)
nRBC: 0 % (ref 0.0–0.2)

## 2020-07-14 LAB — I-STAT BETA HCG BLOOD, ED (MC, WL, AP ONLY): I-stat hCG, quantitative: <5 m[IU]/mL (ref <5)

## 2020-07-14 LAB — LIPASE, BLOOD: Lipase: 24 U/L (ref 11–51)

## 2020-07-14 LAB — TROPONIN I (HIGH SENSITIVITY)
Troponin I (High Sensitivity): 3 ng/L (ref <18)
Troponin I (High Sensitivity): <2 ng/L (ref <18)

## 2020-07-14 LAB — D-DIMER, QUANTITATIVE: D-Dimer, Quant: 0.4 ug/mL-FEU (ref 0.00–0.50)

## 2020-07-14 MED ORDER — ONDANSETRON HCL 4 MG/2ML IJ SOLN
4.0000 mg | Freq: Once | INTRAMUSCULAR | Status: AC
  Administered 2020-07-14: 4 mg via INTRAVENOUS
  Filled 2020-07-14: qty 2

## 2020-07-14 MED ORDER — MORPHINE SULFATE (PF) 4 MG/ML IV SOLN
4.0000 mg | Freq: Once | INTRAVENOUS | Status: AC
  Administered 2020-07-14: 4 mg via INTRAVENOUS
  Filled 2020-07-14: qty 1

## 2020-07-14 MED ORDER — ONDANSETRON HCL 4 MG PO TABS
4.0000 mg | ORAL_TABLET | Freq: Three times a day (TID) | ORAL | 0 refills | Status: DC | PRN
Start: 2020-07-14 — End: 2021-02-06

## 2020-07-14 MED ORDER — SODIUM CHLORIDE 0.9% FLUSH
3.0000 mL | Freq: Once | INTRAVENOUS | Status: AC
  Administered 2020-07-14: 3 mL via INTRAVENOUS

## 2020-07-14 NOTE — Discharge Instructions (Signed)
You were seen in the ED for back and chest pain  Ultrasound confirmed several stones in your gallbladder but no acute infection or obstruction  This can cause biliary colic or intermittent gallbladder attacks.  These tend to cause right upper abdominal pain, nausea and vomiting.  They usually occur right after during meals.  There is no indication for emergent surgery today but we recommend follow-up with general surgery as outpatient/in the clinic to discuss possible elective gallbladder removal if your gallbladder attacks become frequent and severe  Return to the ED for constant, severe right upper quadrant or back pain, nausea, persistent vomiting, fevers  Call general surgery clinic to make a follow up appointment   Usually low fat diet and small portion meals help prevent severe gall bladder attacks (biliary colic). Tylenol or ibuprofen as needed for pain.  Ondansetron for nausea

## 2020-07-14 NOTE — ED Notes (Signed)
Pt transported to ultrasound.

## 2020-07-14 NOTE — ED Triage Notes (Signed)
Pt states was woke up at 0300 with back pain and chest pain.  She tried to take anti-inflammatory medication and no change. Pt states that she also got lightheaded and vomited.  She said she felt flushed and hot.  Pt now with midsternal chest pain that radiates to back.

## 2020-07-14 NOTE — ED Provider Notes (Signed)
Sandy [name]   CSN: [id number] Arrival date & time: 07/14/20  1448     History Chief Complaint  Patient presents with  . Chest Pain    Sandy [name] is a 49 y.o. female presents to the ED for evaluation of sudden onset, severe pain in the back below her right shoulder blade that woke her up from her sleep at 3 AM today.  She stood up and drink water and took ibuprofen and suddenly became light-headed, nauseated and vomited twice, nonbloody and without coffee grounds.  Felt like the back pain actually eased up after vomiting but then developed central low chest "pressure" that has been constant since, 4/10.  This chest pressure has been constant, nonpleuritic.  Nothing makes it better or worse.  Denies any more nausea or vomiting.  No fevers.  No bowel movement changes.  No dysuria, hematuria.  No history of gallstone issues, kidney stones.  No history of DVTs, PEs.  No recent surgeries, prolonged immobilization, hormone therapy, hemoptysis, cancer, leg swelling or calf pain.  No chest pain or shortness of breath.  History of ovarian cyst that she states were removed along with her appendix many years ago.  HPI     Past Medical History:  Diagnosis Date  . Abnormal Pap smear    H/O CIN II  . Anemia    with iron infusions  . Back pain   . Costochondritis   . Migraines   . Psoriasis   . STD (sexually transmitted disease)    HSV II    Patient Active Problem List   Diagnosis Date Noted  . Iron deficiency anemia 10/10/2019  . Plantar fasciitis 01/01/2017  . Psoriasis 10/03/2015    Past Surgical History:  Procedure Laterality Date  . CRYOTHERAPY  2009   of cx  . DERMOID CYST  EXCISION  1992   left ? ovary/appendectomy  . OVARIAN CYST REMOVAL  2000  . urethral stretching  1993     OB History    Gravida  0   Para  0   Term  0   Preterm  0   AB  0   Living  0     SAB  0   TAB  0   Ectopic  0   Multiple  0     Live Births  0           Family History  Problem Relation Age of Onset  . Thyroid disease Mother        and maternal side  . Hypertension Mother   . Heart Problems Mother        heart arythmia  . Breast cancer Mother 57       had negative genetic testing  . Diabetes Father   . Throat cancer Maternal Grandfather   . Cervical cancer Paternal Grandmother   . Heart attack Maternal Uncle   . Thrombocytopenia Sister        ITP    Social History   Tobacco Use  . Smoking status: Never Smoker  . Smokeless tobacco: Never Used  Vaping Use  . Vaping Use: Never used  Substance Use Topics  . Alcohol use: Yes    Alcohol/week: 1.0 standard drink    Types: 1 Standard drinks or equivalent per week    Comment: 1 or less  . Drug use: No    Home Medications Prior to Admission medications   Medication Sig Start Date End  Date Taking? Authorizing Provider  Cholecalciferol (VITAMIN D3) 125 MCG (5000 UT) CAPS Take by mouth daily.    [provider]  ciprofloxacin (CIPRO) 500 MG tablet Take 1 tablet (500 mg total) by mouth 2 (two) times daily. 12/22/19   Megan Salon, MD  DERMA-SMOOTHE/FS BODY 0.01 % OIL Apply  a small amount to affected area twice a day  for 1-2 weeks 03/21/20   [provider]  Multiple Vitamin (MULTIVITAMIN) tablet Take 1 tablet by mouth daily.    [provider]  nitrofurantoin, macrocrystal-monohydrate, (MACROBID) 100 MG capsule Take 1 capsule (100 mg total) by mouth 2 (two) times daily. 12/21/19   Megan Salon, MD  ondansetron (ZOFRAN) 4 MG tablet Take 1 tablet (4 mg total) by mouth every 8 (eight) hours as needed for nausea or vomiting. 07/14/20   Kinnie Feil, PA-C  SUMAtriptan (IMITREX) 100 MG tablet Take 1 tablet (100 mg total) by mouth every 2 (two) hours as needed for migraine. May repeat in 2 hours if headache persists or recurs. 03/26/20   Megan Salon, MD  triamcinolone cream (KENALOG) 0.1 % SMARTSIG:1 Application Topical 2-3  Times Daily 03/21/20   [provider]  valACYclovir (VALTREX) 500 MG tablet Take 1 tablet (500 mg total) by mouth daily. Increase to bid x 3 days with any symptoms. 09/05/19   Megan Salon, MD    Allergies    Codeine, Sulfa antibiotics, and Tramadol  Review of Systems   Review of Systems  Cardiovascular: Positive for chest pain.  Gastrointestinal: Positive for nausea and vomiting.  Neurological: Positive for light-headedness.  All other systems reviewed and are negative.   Physical Exam Updated Vital Signs BP 110/71   Pulse 73   Temp 98 F (36.7 C) (Oral)   Resp 13   SpO2 96%   Physical Exam Vitals and nursing [name] reviewed.  Constitutional:      General: She is not in acute distress.    Appearance: She is well-developed.     Comments: NAD.  HENT:     Head: Normocephalic and atraumatic.     Right Ear: External ear normal.     Left Ear: External ear normal.     Nose: Nose normal.  Eyes:     General: No scleral icterus.    Conjunctiva/sclera: Conjunctivae normal.  Cardiovascular:     Rate and Rhythm: Normal rate and regular rhythm.     Heart sounds: Normal heart sounds. No murmur heard.      Comments: 1+ radial and DP pulses bilaterally.  No lower extremity membrane or calf tenderness.  No chest wall tenderness. Pulmonary:     Effort: Pulmonary effort is normal.     Breath sounds: Normal breath sounds. No wheezing.  Abdominal:     Palpations: Abdomen is soft.     Tenderness: There is no abdominal tenderness.     Comments: Obese abdomen, soft.  No right upper quadrant epigastric tenderness.  No suprapubic or CVA tenderness.  Bedside ultrasound of the right upper quadrant performed by me shows hyperechoic likely gallstones in the gallbladder.  Right kidney visualized benign.  Musculoskeletal:        General: No deformity. Normal range of motion.     Cervical back: Normal range of motion and neck supple.  Skin:    General: Skin is warm and dry.      Capillary Refill: Capillary refill takes less than 2 seconds.  Neurological:     Mental Status: She  is alert and oriented to person, place, and time.     Comments: Sensation and strength intact in upper and lower extremities.  Psychiatric:        Behavior: Behavior normal.        Thought Content: Thought content normal.        Judgment: Judgment normal.     ED Results / Procedures / Treatments   Labs (all labs ordered are listed, but only abnormal results are displayed) Labs Reviewed  BASIC METABOLIC PANEL - Abnormal; Notable for the following components:      Result Value   Glucose, Bld 121 (*)    All other components within normal limits  URINALYSIS, ROUTINE W REFLEX MICROSCOPIC - Abnormal; Notable for the following components:   Color, Urine STRAW (*)    All other components within normal limits  HEPATIC FUNCTION PANEL - Abnormal; Notable for the following components:   Albumin 3.4 (*)    All other components within normal limits  CBC  LIPASE, BLOOD  D-DIMER, QUANTITATIVE (NOT AT [facility])  I-STAT BETA HCG BLOOD, ED ([facility], [facility], [facility] ONLY)  TROPONIN I (HIGH SENSITIVITY)  TROPONIN I (HIGH SENSITIVITY)    EKG EKG Interpretation  Date/Time:  Sunday July 14 2020 05:15:05 EDT Ventricular Rate:  79 PR Interval:  164 QRS Duration: 92 QT Interval:  364 QTC Calculation: 417 R Axis:   4 Text Interpretation: Sinus rhythm with occasional Premature ventricular complexes Otherwise normal ECG Confirmed by Pattricia Boss [phone]) on 07/14/2020 12:28:45 PM   Radiology DG Chest 2 View  Result Date: 07/14/2020 CLINICAL DATA:  Chest pain EXAM: CHEST - 2 VIEW COMPARISON:  12/12/2013 FINDINGS: Lungs are clear.  No pleural effusion or pneumothorax. The heart is normal in size. Visualized osseous structures are within normal limits. IMPRESSION: Normal chest radiographs. Electronically Signed   By: Julian Hy M.D.   On: 07/14/2020 06:01   CT Renal Stone Study  Result Date: 07/14/2020 CLINICAL  DATA:  RIGHT flank pain and chest pain. Nausea and vomiting. EXAM: CT ABDOMEN AND PELVIS WITHOUT CONTRAST TECHNIQUE: Multidetector CT imaging of the abdomen and pelvis was performed following the standard protocol without IV contrast. COMPARISON:  None. FINDINGS: Lower chest: Lung bases are clear. Hepatobiliary: No focal hepatic lesion on noncontrast exam. There is mild gallbladder wall thickening. There is low-density gallstones within lumen the gallbladder. Trace amount of pericholecystic fluid. Gallbladder is nondistended measuring 4.2 cm in diameter. Common bile duct normal caliber. No intrahepatic duct dilatation identified. Pancreas: Pancreas is normal. No ductal dilatation. No pancreatic inflammation. Spleen: Normal spleen Adrenals/urinary tract: Adrenal glands and kidneys are normal. The ureters and bladder normal. Stomach/Bowel: Stomach, small-bowel and cecum are normal. The appendix is not identified but there is no pericecal inflammation to suggest appendicitis. Multiple diverticula of the descending colon and sigmoid colon without acute inflammation. Vascular/Lymphatic: Abdominal aorta is normal caliber. No periportal or retroperitoneal adenopathy. No pelvic adenopathy. Reproductive: Uterus and adnexa unremarkable. Other: No free fluid. Musculoskeletal: No aggressive osseous lesion. IMPRESSION: 1. Mild gallbladder wall thickening and several low-density gallstones. Trace pericholecystic fluid. Consider RIGHT upper quadrant ultrasound if cholecystitis is in the clinical differential. 2. LEFT colon diverticulosis without evidence diverticulitis. Electronically Signed   By: Suzy Bouchard M.D.   On: 07/14/2020 13:53   US Abdomen Limited RUQ  Result Date: 07/14/2020 CLINICAL DATA:  Back pain.  History of gallstones. EXAM: ULTRASOUND ABDOMEN LIMITED RIGHT UPPER QUADRANT COMPARISON:  CT renal stone protocol on 07/14/2020 FINDINGS: Gallbladder: Gallbladder wall is normal  in thickness, 2.2 millimeters.  Multiple stones are present, measuring up to 7 millimeters in diameter. No pericholecystic fluid. No sonographic Murphy sign. Common bile duct: Diameter: 5.5 millimeters Liver: The liver is echogenic. There is attenuation of the ultrasound wave, poor visualization of the internal hepatic architecture, and loss of definition of the diaphragm. Portal vein is patent on color Doppler imaging with normal direction of blood flow towards the liver. Other: None. IMPRESSION: 1. Cholelithiasis without other evidence of cholecystitis. 2. Hepatic steatosis. Electronically Signed   By: Nolon Nations M.D.   On: 07/14/2020 14:54    Procedures Procedures (including critical care time)  Medications Ordered in ED Medications  sodium chloride flush (NS) 0.9 % injection 3 mL (3 mLs Intravenous Given 07/14/20 1048)  morphine 4 MG/ML injection 4 mg (4 mg Intravenous Given 07/14/20 1045)  ondansetron (ZOFRAN) injection 4 mg (4 mg Intravenous Given 07/14/20 1045)    ED Course  I have reviewed the triage vital signs and the nursing notes.  Pertinent labs & imaging results that were available during my care of the patient were reviewed by me and considered in my medical decision making (see chart for details).  Clinical Course as of Jul 14 1545  Sun Jul 14, 2020  1230 Discussed patient with EDP Ray.  Ddx GB colic, renal colic. Less likely PE. Recommends CT renal, d-dimer. Ordered.   [CG]  1400 IMPRESSION: 1. Mild gallbladder wall thickening and several low-density gallstones. Trace pericholecystic fluid. Consider RIGHT upper quadrant ultrasound if cholecystitis is in the clinical differential. 2. LEFT colon diverticulosis without evidence diverticulitis.  CT Renal Stone Study [CG]  1502 IMPRESSION: 1. Cholelithiasis without other evidence of cholecystitis. 2. Hepatic steatosis.  US Abdomen Limited RUQ [CG]    Clinical Course User Index [CG] Kinnie Feil, PA-C   MDM Rules/Calculators/A&P                           Patient presents with acute, severe right flank pain followed with nausea, vomiting and lightheadedness.  Followed with substernal chest pressure that has been constant, nonpleuritic.  Patient ambulated in the ED and right flank pain returned.  Based on her pain onset, severity highest on DDX is biliary colic, renal colic.  No risk factors for PE/DVT.  PERC negative.  Well score is low.  PE is very unlikely.  ER work-up initiated in triage.  This was personally visualized and interpreted.  EKG shows sinus rhythm with infrequent PVCs.  No ischemic changes.  Troponin x2 undetectable.  Lab work with normal LFTs, lipase, creatinine.  Urinalysis without RBCs or infection.  Bedside ultrasound shows gallstones.  Discussed with EDP, will go ahead and add a D-dimer and CT renal.  1544: D-dimer is negative.  CT renal shows gallbladder stones with possible gallbladder thickening and pericholecystic fluid.  I consulted general surgery Dr. Brandon Melnick who recommended formal right upper quadrant ultrasound formal diagnostic work-up of possible biliary colic versus cholecystitis.  RUQ ultrasound shows several gallstones largest measuring 7 mm but no CBD dilation, obstructing stone, cholecystitis.  Reevaluated patient and reports she has soreness in the right backslash chest.  No more vomiting.  Continues to have benign abdominal exam.  No emesis here.  Discussed ultrasound findings, diet modifications, general surgery follow-up for possible elective cholecystectomy.  Return precautions discussed.  She is comfortable with this plan. Final Clinical Impression(s) / ED Diagnoses Final diagnoses:  Back pain  Biliary colic    Rx /  DC Orders ED Discharge Orders         Ordered    ondansetron (ZOFRAN) 4 MG tablet  Every 8 hours PRN     Discontinue  Reprint     07/14/20 1519           Kinnie Feil, PA-C 07/14/20 1546    Pattricia Boss, MD 07/14/20 [phone]

## 2020-10-02 ENCOUNTER — Other Ambulatory Visit: Payer: Self-pay | Admitting: Obstetrics & Gynecology

## 2020-10-02 DIAGNOSIS — Z1231 Encounter for screening mammogram for malignant neoplasm of breast: Secondary | ICD-10-CM

## 2020-10-10 ENCOUNTER — Other Ambulatory Visit: Payer: Self-pay

## 2020-10-10 ENCOUNTER — Inpatient Hospital Stay: Payer: Managed Care, Other (non HMO)

## 2020-10-10 ENCOUNTER — Inpatient Hospital Stay: Payer: Managed Care, Other (non HMO) | Attending: Oncology | Admitting: Oncology

## 2020-10-10 VITALS — BP 114/75 | HR 78 | Temp 97.1°F | Resp 18 | Ht 71.0 in | Wt 270.4 lb

## 2020-10-10 DIAGNOSIS — D500 Iron deficiency anemia secondary to blood loss (chronic): Secondary | ICD-10-CM | POA: Diagnosis not present

## 2020-10-10 DIAGNOSIS — D509 Iron deficiency anemia, unspecified: Secondary | ICD-10-CM

## 2020-10-10 DIAGNOSIS — N920 Excessive and frequent menstruation with regular cycle: Secondary | ICD-10-CM | POA: Insufficient documentation

## 2020-10-10 LAB — IRON AND TIBC
Iron: 99 ug/dL (ref 41–142)
Saturation Ratios: 25 % (ref 21–57)
TIBC: 389 ug/dL (ref 236–444)
UIBC: 290 ug/dL (ref 120–384)

## 2020-10-10 LAB — CBC WITH DIFFERENTIAL (CANCER CENTER ONLY)
Abs Immature Granulocytes: 0.03 10*3/uL (ref 0.00–0.07)
Basophils Absolute: 0 10*3/uL (ref 0.0–0.1)
Basophils Relative: 1 %
Eosinophils Absolute: 0.1 10*3/uL (ref 0.0–0.5)
Eosinophils Relative: 2 %
HCT: 39.8 % (ref 36.0–46.0)
Hemoglobin: 12.8 g/dL (ref 12.0–15.0)
Immature Granulocytes: 1 %
Lymphocytes Relative: 25 %
Lymphs Abs: 1.7 10*3/uL (ref 0.7–4.0)
MCH: 27.2 pg (ref 26.0–34.0)
MCHC: 32.2 g/dL (ref 30.0–36.0)
MCV: 84.5 fL (ref 80.0–100.0)
Monocytes Absolute: 0.8 10*3/uL (ref 0.1–1.0)
Monocytes Relative: 12 %
Neutro Abs: 4 10*3/uL (ref 1.7–7.7)
Neutrophils Relative %: 59 %
Platelet Count: 310 10*3/uL (ref 150–400)
RBC: 4.71 MIL/uL (ref 3.87–5.11)
RDW: 14.7 % (ref 11.5–15.5)
WBC Count: 6.7 10*3/uL (ref 4.0–10.5)
nRBC: 0 % (ref 0.0–0.2)

## 2020-10-10 LAB — FERRITIN: Ferritin: 18 ng/mL (ref 11–307)

## 2020-10-10 NOTE — Progress Notes (Signed)
Hematology and Oncology Follow Up Visit  Sandy [name] [id number] 1971-02-27 49 y.o. 10/10/2020 10:04 AM  CC: Sandy [name]. Sandy [name], M.D.    Principle Diagnosis:  49 year old woman with anemia related to chronic menstrual blood losses and iron deficiency  diagnosed in 2012. She has been receiving intravenous iron intermittently given an effective oral iron therapy.  Current therapy: Oral iron replacement and intermittent IV iron is needed. Last IV iron infusion in Nov 2020.     Interim History: [name] returns today for repeat evaluation. Since last visit, she reports increased fatigue and tiredness especially after full day of work.  She has reported some occasional arthralgias but still able to be active.  She denies any shortness of breath or difficulty breathing.  He denies any hematochezia or melena.  She does report heavy menstrual cycles which has persisted.    .     Medications: Unchanged on review. Current Outpatient Medications  Medication Sig Dispense Refill  . Cholecalciferol (VITAMIN D3) 125 MCG (5000 UT) CAPS Take by mouth daily.    . ciprofloxacin (CIPRO) 500 MG tablet Take 1 tablet (500 mg total) by mouth 2 (two) times daily. 6 tablet 0  . DERMA-SMOOTHE/FS BODY 0.01 % OIL Apply  [name] small amount to affected area twice [name] day  for 1-2 weeks    . Multiple Vitamin (MULTIVITAMIN) tablet Take 1 tablet by mouth daily.    . nitrofurantoin, macrocrystal-monohydrate, (MACROBID) 100 MG capsule Take 1 capsule (100 mg total) by mouth 2 (two) times daily. 10 capsule 0  . ondansetron (ZOFRAN) 4 MG tablet Take 1 tablet (4 mg total) by mouth every 8 (eight) hours as needed for nausea or vomiting. 4 tablet 0  . SUMAtriptan (IMITREX) 100 MG tablet Take 1 tablet (100 mg total) by mouth every 2 (two) hours as needed for migraine. May repeat in 2 hours if headache persists or recurs. 9 tablet 1  . triamcinolone cream (KENALOG) 0.1 % SMARTSIG:1 Application Topical 2-3 Times Daily    .  valACYclovir (VALTREX) 500 MG tablet Take 1 tablet (500 mg total) by mouth daily. Increase to bid x 3 days with any symptoms. 120 tablet 4   No current facility-administered medications for this visit.    Allergies:  Allergies  Allergen Reactions  . Codeine Nausea And Vomiting    Excessive Sweating  . Sulfa Antibiotics   . Tramadol Nausea And Vomiting        Physical Exam: Blood pressure 114/75, pulse 78, temperature (!) 97.1 F (36.2 C), temperature source Tympanic, resp. rate 18, height 5' 11"  (1.803 m), weight 270 lb 6.4 oz (122.7 kg), SpO2 99 %.    ECOG: 0   General appearance: Alert, awake without any distress. Head: Atraumatic without abnormalities Oropharynx: Without any thrush or ulcers. Eyes: No scleral icterus. Lymph nodes: No lymphadenopathy noted in the cervical, supraclavicular, or axillary nodes Heart:regular rate and rhythm, without any murmurs or gallops.   Lung: Clear to auscultation without any rhonchi, wheezes or dullness to percussion. Abdomin: Soft, nontender without any shifting dullness or ascites. Musculoskeletal: No clubbing or cyanosis. Neurological: No motor or sensory deficits. Skin: No rashes or lesions.         Lab Results: Lab Results  Component Value Date   WBC 8.1 07/14/2020   HGB 12.9 07/14/2020   HCT 41.4 07/14/2020   MCV 86.3 07/14/2020   PLT 287 07/14/2020            Results for Sandy [name] (MRN  [id number]) as of 10/10/2020 10:05  Ref. Range 01/10/2020 10:04 04/09/2020 10:21  Iron Latest Ref Range: 41 - 142 ug/dL 106 55  UIBC Latest Ref Range: 120 - 384 ug/dL 210 298  TIBC Latest Ref Range: 236 - 444 ug/dL 316 354  Saturation Ratios Latest Ref Range: 21 - 57 % 34 16 (L)  Ferritin Latest Ref Range: 11 - 307 ng/mL 26 11     Impression and Plan:   49 year old woman with:  1. Anemia related to chronic menstrual blood losses and iron deficiency diagnosed in 2012.  She is currently on oral iron replacement with [name]  repeat intravenous iron as needed. Iron studies in May 2021 showed declining iron and ferritin level. Hemoglobin in Aug 2021 was 12.9. Risks and benefits of intravenous iron repeat infusion as needed were discussed. Complications that include arthralgias, myalgias and the infusion related complications. Rarely anaphylaxis is noted.  After discussion today she is agreeable to proceed with [name] repeat IV iron given her iron studies.   2.  Heavy menstrual cycles: I recommended follow-up with gynecology regarding this issue.  3.  follow-up: In 6 months for repeat follow-up.   30 minutes were dedicated to this encounter. The time was spent on reviewing laboratory data, treatment options and addressing complications noted therapy.    Sandy [name] 11/4/202110:04 AM

## 2020-10-11 ENCOUNTER — Telehealth: Payer: Self-pay | Admitting: Oncology

## 2020-10-11 NOTE — Telephone Encounter (Signed)
Scheduled appointments per 11/4 los. Spoke to patient who is aware of appointments dates and times.

## 2020-10-14 ENCOUNTER — Other Ambulatory Visit: Payer: Self-pay

## 2020-10-14 ENCOUNTER — Inpatient Hospital Stay: Payer: Managed Care, Other (non HMO)

## 2020-10-14 VITALS — BP 122/68 | HR 71 | Temp 98.3°F | Resp 18

## 2020-10-14 DIAGNOSIS — D500 Iron deficiency anemia secondary to blood loss (chronic): Secondary | ICD-10-CM | POA: Diagnosis not present

## 2020-10-14 DIAGNOSIS — D509 Iron deficiency anemia, unspecified: Secondary | ICD-10-CM

## 2020-10-14 MED ORDER — SODIUM CHLORIDE 0.9 % IV SOLN
510.0000 mg | Freq: Once | INTRAVENOUS | Status: AC
  Administered 2020-10-14: 510 mg via INTRAVENOUS
  Filled 2020-10-14: qty 510

## 2020-10-14 MED ORDER — SODIUM CHLORIDE 0.9 % IV SOLN
Freq: Once | INTRAVENOUS | Status: AC
  Filled 2020-10-14: qty 250

## 2020-10-14 NOTE — Patient Instructions (Signed)

## 2020-10-21 ENCOUNTER — Other Ambulatory Visit: Payer: Self-pay

## 2020-10-21 ENCOUNTER — Inpatient Hospital Stay: Payer: Managed Care, Other (non HMO)

## 2020-10-21 VITALS — BP 111/65 | HR 71 | Temp 98.2°F | Resp 18 | Ht 71.0 in | Wt 274.2 lb

## 2020-10-21 DIAGNOSIS — D500 Iron deficiency anemia secondary to blood loss (chronic): Secondary | ICD-10-CM | POA: Diagnosis not present

## 2020-10-21 DIAGNOSIS — D509 Iron deficiency anemia, unspecified: Secondary | ICD-10-CM

## 2020-10-21 MED ORDER — SODIUM CHLORIDE 0.9 % IV SOLN
510.0000 mg | Freq: Once | INTRAVENOUS | Status: AC
  Administered 2020-10-21: 510 mg via INTRAVENOUS
  Filled 2020-10-21: qty 510

## 2020-10-21 MED ORDER — SODIUM CHLORIDE 0.9 % IV SOLN
Freq: Once | INTRAVENOUS | Status: AC
  Filled 2020-10-21: qty 250

## 2020-10-21 NOTE — Patient Instructions (Signed)

## 2020-11-06 ENCOUNTER — Other Ambulatory Visit: Payer: Self-pay

## 2020-11-06 ENCOUNTER — Ambulatory Visit
Admission: RE | Admit: 2020-11-06 | Discharge: 2020-11-06 | Disposition: A | Discharge status: Home / Self Care | Payer: Managed Care, Other (non HMO) | Source: Ambulatory Visit | Attending: Obstetrics & Gynecology | Admitting: Obstetrics & Gynecology

## 2020-11-06 DIAGNOSIS — Z1231 Encounter for screening mammogram for malignant neoplasm of breast: Secondary | ICD-10-CM

## 2020-11-07 NOTE — Progress Notes (Signed)
49 y.o. G0P0000 Single White or Caucasian female here for annual exam.   Period Duration (Days): 7 Period Pattern: Regular Menstrual Flow: Heavy Menstrual Control: Maxi pad Dysmenorrhea: (!) Moderate Dysmenorrhea Symptoms: Headache, Nausea, Cramping (occ diarrhea)  Patient's last menstrual period was 11/06/2020 (exact date).   Recently  Had iron infusion, about once a year has extreme fatigue and blood work shows anemia. Unknown cause. Has always had heavy periods, last a full 7 days. Dr. Sabra Heck discussed IUD last year but it didn't appeal to her. She is interested in trying something else. When she gets her period, she experiences body aches, knee pain and headaches.  This past couple months started having a few "flushing episodes"         Sexually active: No.  The current method of family planning is abstinence.    Exercising: No.  exercise Smoker:  no  Health Maintenance: Pap:  01-01-17 neg HPV HR neg, 09-05-2019 neg History of abnormal Pap:  Yes LEEP MMG:  11-06-2020 category b density birads 1:neg Colonoscopy:  2009 BMD:   Heel scan TDaP:  UTD Gardasil:   n/a Covid-19: moderna Pneumonia vaccine(s):  Not done Shingrix:   Not done Hep C testing: not done Screening Labs: will have lipid panel/A1C done here, has not been to PCP in 2 years   reports that she has never smoked. She has never used smokeless tobacco. She reports current alcohol use of about 1.0 standard drink of alcohol per week. She reports that she does not use drugs.  Past Medical History:  Diagnosis Date  . Abnormal Pap smear    H/O CIN II  . Anemia    with iron infusions  . Back pain   . Costochondritis   . Migraines   . Psoriasis   . STD (sexually transmitted disease)    HSV II    Past Surgical History:  Procedure Laterality Date  . CRYOTHERAPY  2009   of cx  . DERMOID CYST  EXCISION  1992   left ? ovary/appendectomy  . OVARIAN CYST REMOVAL  2000  . urethral stretching  1993    Current  Outpatient Medications  Medication Sig Dispense Refill  . DERMA-SMOOTHE/FS BODY 0.01 % OIL Apply  a small amount to affected area twice a day  for 1-2 weeks    . Multiple Vitamin (MULTIVITAMIN) tablet Take 1 tablet by mouth daily.    . SUMAtriptan (IMITREX) 100 MG tablet Take 1 tablet (100 mg total) by mouth every 2 (two) hours as needed for migraine. May repeat in 2 hours if headache persists or recurs. 9 tablet 1  . TREMFYA 100 MG/ML SOPN Inject into the skin.    Marland Kitchen triamcinolone cream (KENALOG) 0.1 % SMARTSIG:1 Application Topical 2-3 Times Daily    . valACYclovir (VALTREX) 500 MG tablet Take 1 tablet (500 mg total) by mouth daily. Increase to bid x 3 days with any symptoms. 120 tablet 4  . ondansetron (ZOFRAN) 4 MG tablet Take 1 tablet (4 mg total) by mouth every 8 (eight) hours as needed for nausea or vomiting. (Patient not taking: Reported on 11/08/2020) 4 tablet 0   No current facility-administered medications for this visit.    Family History  Problem Relation Age of Onset  . Thyroid disease Mother        and maternal side  . Hypertension Mother   . Heart Problems Mother        heart arythmia  . Breast cancer Mother 42  had negative genetic testing  . Diabetes Father   . Throat cancer Maternal Grandfather   . Cervical cancer Paternal Grandmother   . Heart attack Maternal Uncle   . Thrombocytopenia Sister        ITP    Review of Systems  Constitutional: Negative.   HENT: Negative.   Eyes: Negative.   Respiratory: Negative.   Cardiovascular: Negative.   Gastrointestinal: Negative.   Endocrine: Negative.   Genitourinary: Negative.   Musculoskeletal: Negative.   Skin: Negative.   Allergic/Immunologic: Negative.   Neurological: Negative.   Hematological: Negative.   Psychiatric/Behavioral: Negative.     Exam:   BP 110/70   Pulse 68   Resp 16   Ht 5' 10.25" (1.784 m)   Wt 269 lb (122 kg)   LMP 11/06/2020 (Exact Date)   BMI 38.32 kg/m   Height: 5' 10.25"  (178.4 cm)  General appearance: alert, cooperative and appears stated age Head: Normocephalic, without obvious abnormality, atraumatic Neck: no adenopathy, supple, symmetrical, trachea midline and thyroid normal to inspection and palpation Lungs: clear to auscultation bilaterally Breasts: normal appearance, no masses or tenderness Heart: regular rate and rhythm Abdomen: soft, non-tender; bowel sounds normal; no masses,  no organomegaly Extremities: extremities normal, atraumatic, no cyanosis or edema Skin: Skin color, texture, turgor normal. No rashes or lesions Lymph nodes: Cervical, supraclavicular, and axillary nodes normal. No abnormal inguinal nodes palpated Neurologic: Grossly normal   Pelvic: External genitalia:  no lesions              Urethra:  normal appearing urethra with no masses, tenderness or lesions              Bartholins and Skenes: normal                 Vagina: moderate to large amount blood in vagina              Cervix: no cervical motion tenderness and no lesions              Pap taken: No. Bimanual Exam:  Uterus:  normal size, contour, position, consistency, mobility, non-tender              Adnexa: no mass, fullness, tenderness               Rectovaginal: Confirms               Anus:  normal sphincter tone, no lesions  Shanon, CMA Chaperone was present for exam.  A:  Well Woman with normal exam  Menorrhagia/ associated anemia  P:   Mammogram was done 11/06/2020, due in 1 year  pap smear due 2023, Hx CIN II 2009, needs co testing every 3 years until 2029  Start Norethindrone 0.35 mg daily  Labs: lipid panel, A1C  F/u 3 months to re-evaluate bleeding/menstrual symptoms/re-check hemoglobin.

## 2020-11-08 ENCOUNTER — Encounter: Payer: Self-pay | Admitting: Nurse Practitioner

## 2020-11-08 ENCOUNTER — Ambulatory Visit (INDEPENDENT_AMBULATORY_CARE_PROVIDER_SITE_OTHER): Payer: Managed Care, Other (non HMO) | Admitting: Nurse Practitioner

## 2020-11-08 ENCOUNTER — Other Ambulatory Visit: Payer: Self-pay

## 2020-11-08 VITALS — BP 110/70 | HR 68 | Resp 16 | Ht 70.25 in | Wt 269.0 lb

## 2020-11-08 DIAGNOSIS — Z01419 Encounter for gynecological examination (general) (routine) without abnormal findings: Secondary | ICD-10-CM | POA: Diagnosis not present

## 2020-11-08 DIAGNOSIS — N920 Excessive and frequent menstruation with regular cycle: Secondary | ICD-10-CM | POA: Diagnosis not present

## 2020-11-08 MED ORDER — NORETHINDRONE 0.35 MG PO TABS: 1.0000 | ORAL_TABLET | Freq: Every day | ORAL | 4 refills | Status: DC

## 2020-11-08 NOTE — Patient Instructions (Addendum)
Hormonal Contraception Information   Progesterone contraceptives Contraceptives that use progesterone only are available in these forms:  Pill. Pills should be taken every day of the cycle.  Intrauterine device (IUD). This device is inserted into the uterus and removed or replaced every five years or sooner.  Implant. Plastic rods are placed under the skin of the upper arm. They are removed or replaced every three years or sooner.  Injection. The injection is given once every 90 days.  **Progestin only pills  Or Minipill We started this pill today to help manage how heavy your periods are. I feel like that is a contributor to your anemia and I think you would feel better with that under better control.  I encourage you to follow up in 3 months so we can discuss your periods and how you feel. We may consider a pelvic ultrasound if your bleeding and other symptoms around your period are not improved.  Health Maintenance, Female Adopting a healthy lifestyle and getting preventive care are important in promoting health and wellness. Ask your health care provider about:  The right schedule for you to have regular tests and exams.  Things you can do on your own to prevent diseases and keep yourself healthy. What should I know about diet, weight, and exercise? Eat a healthy diet   Eat a diet that includes plenty of vegetables, fruits, low-fat dairy products, and lean protein.  Do not eat a lot of foods that are high in solid fats, added sugars, or sodium. Maintain a healthy weight Body mass index (BMI) is used to identify weight problems. It estimates body fat based on height and weight. Your health care provider can help determine your BMI and help you achieve or maintain a healthy weight. Get regular exercise Get regular exercise. This is one of the most important things you can do for your health. Most adults should:  Exercise for at least 150 minutes each week. The exercise should  increase your heart rate and make you sweat (moderate-intensity exercise).  Do strengthening exercises at least twice a week. This is in addition to the moderate-intensity exercise.  Spend less time sitting. Even light physical activity can be beneficial. Watch cholesterol and blood lipids Have your blood tested for lipids and cholesterol at 48 years of age, then have this test every 5 years. Have your cholesterol levels checked more often if:  Your lipid or cholesterol levels are high.  You are older than 48 years of age.  You are at high risk for heart disease. What should I know about cancer screening? Depending on your health history and family history, you may need to have cancer screening at various ages. This may include screening for:  Breast cancer.  Cervical cancer.  Colorectal cancer.  Skin cancer.  Lung cancer. What should I know about heart disease, diabetes, and high blood pressure? Blood pressure and heart disease  High blood pressure causes heart disease and increases the risk of stroke. This is more likely to develop in people who have high blood pressure readings, are of African descent, or are overweight.  Have your blood pressure checked: ? Every 3-5 years if you are 48-37 years of age. ? Every year if you are 48 years old or older. Diabetes Have regular diabetes screenings. This checks your fasting blood sugar level. Have the screening done:  Once every three years after age 48 if you are at a normal weight and have a low risk for diabetes.  More  often and at a younger age if you are overweight or have a high risk for diabetes. What should I know about preventing infection? Hepatitis B If you have a higher risk for hepatitis B, you should be screened for this virus. Talk with your health care provider to find out if you are at risk for hepatitis B infection. Hepatitis C Testing is recommended for:  Everyone born from [birth year] through 1964.  Anyone with  known risk factors for hepatitis C. Sexually transmitted infections (STIs)  Get screened for STIs, including gonorrhea and chlamydia, if: ? You are sexually active and are younger than 48 years of age. ? You are older than 48 years of age and your health care provider tells you that you are at risk for this type of infection. ? Your sexual activity has changed since you were last screened, and you are at increased risk for chlamydia or gonorrhea. Ask your health care provider if you are at risk.  Ask your health care provider about whether you are at high risk for HIV. Your health care provider may recommend a prescription medicine to help prevent HIV infection. If you choose to take medicine to prevent HIV, you should first get tested for HIV. You should then be tested every 3 months for as long as you are taking the medicine. Pregnancy  If you are about to stop having your period (premenopausal) and you may become pregnant, seek counseling before you get pregnant.  Take 400 to 800 micrograms (mcg) of folic acid every day if you become pregnant.  Ask for birth control (contraception) if you want to prevent pregnancy. Osteoporosis and menopause Osteoporosis is a disease in which the bones lose minerals and strength with aging. This can result in bone fractures. If you are 48 years old or older, or if you are at risk for osteoporosis and fractures, ask your health care provider if you should:  Be screened for bone loss.  Take a calcium or vitamin D supplement to lower your risk of fractures.  Be given hormone replacement therapy (HRT) to treat symptoms of menopause. Follow these instructions at home: Lifestyle  Do not use any products that contain nicotine or tobacco, such as cigarettes, e-cigarettes, and chewing tobacco. If you need help quitting, ask your health care provider.  Do not use street drugs.  Do not share needles.  Ask your health care provider for help if you need  support or information about quitting drugs. Alcohol use  Do not drink alcohol if: ? Your health care provider tells you not to drink. ? You are pregnant, may be pregnant, or are planning to become pregnant.  If you drink alcohol: ? Limit how much you use to 0-1 drink a day. ? Limit intake if you are breastfeeding.  Be aware of how much alcohol is in your drink. In the [place] one drink equals one 12 oz bottle of beer (355 mL), one 5 oz glass of wine (148 mL), or one 1 oz glass of hard liquor (44 mL). General instructions  Schedule regular health, dental, and eye exams.  Stay current with your vaccines.  Tell your health care provider if: ? You often feel depressed. ? You have ever been abused or do not feel safe at home. Summary  Adopting a healthy lifestyle and getting preventive care are important in promoting health and wellness.  Follow your health care provider's instructions about healthy diet, exercising, and getting tested or screened for diseases.  Follow your  health care provider's instructions on monitoring your cholesterol and blood pressure. This information is not intended to replace advice given to you by your health care provider. Make sure you discuss any questions you have with your health care provider. Document Revised: 11/16/2018 Document Reviewed: 11/16/2018 Elsevier Patient Education  2020 [employer].

## 2020-11-09 LAB — LIPID PANEL WITH LDL/HDL RATIO
Cholesterol, Total: 215 mg/dL — ABNORMAL HIGH (ref 100–199)
HDL: 42 mg/dL (ref >39)
LDL Chol Calc (NIH): 153 mg/dL — ABNORMAL HIGH (ref 0–99)
LDL/HDL Ratio: 3.6 ratio — ABNORMAL HIGH (ref 0.0–3.2)
Triglycerides: 113 mg/dL (ref 0–149)
VLDL Cholesterol Cal: 20 mg/dL (ref 5–40)

## 2020-11-09 LAB — HEMOGLOBIN A1C
Est. average glucose Bld gHb Est-mCnc: 114 mg/dL
Hgb A1c MFr Bld: 5.6 % (ref 4.8–5.6)

## 2021-02-03 NOTE — Progress Notes (Signed)
GYNECOLOGY  VISIT  CC:   F/U bleeding and hemoglobin  HPI: 50 y.o. G0P0000 Single White or Caucasian female here for 45mh follow up to re-evaluate bleeding/menstrual symptoms & recheck hgb.  Was here for annual exam 11/08/2020, reported heavy periods. Had associated anemia and iron transfusion 10/2020.  Started on Norethindrone. First cycle Jan 1, 4-5 days of bleeding but less bleeding The next was Feb 4, lasted 6 days, just as heavy as was before Norethindrone.  Feeling achy and flu-like now (like usual before period), so knows it is coming soon  She feels a little more energy than before. No unusual side effects  GYNECOLOGIC HISTORY: Patient's last menstrual period was 01/10/2021 (exact date). Contraception: norethindrone Menopausal hormone therapy: none  Patient Active Problem List   Diagnosis Date Noted  . Iron deficiency anemia 10/10/2019  . Plantar fasciitis 01/01/2017  . Psoriasis 10/03/2015    Past Medical History:  Diagnosis Date  . Abnormal Pap smear    H/O CIN II  . Anemia    with iron infusions  . Back pain   . Costochondritis   . Migraines   . Psoriasis   . STD (sexually transmitted disease)    HSV II    Past Surgical History:  Procedure Laterality Date  . CRYOTHERAPY  2009   of cx  . DERMOID CYST  EXCISION  1992   left ? ovary/appendectomy  . OVARIAN CYST REMOVAL  2000  . urethral stretching  1993    MEDS:   Current Outpatient Medications on File Prior to Visit  Medication Sig Dispense Refill  . cyclobenzaprine (FLEXERIL) 10 MG tablet Take by mouth.    . DERMA-SMOOTHE/FS BODY 0.01 % OIL Apply  a small amount to affected area twice a day  for 1-2 weeks    . Multiple Vitamin (MULTIVITAMIN) tablet Take 1 tablet by mouth daily.    . naproxen (NAPROSYN) 500 MG tablet Take by mouth.    . norethindrone (MICRONOR) 0.35 MG tablet Take 1 tablet (0.35 mg total) by mouth daily. 84 tablet 4  . SUMAtriptan (IMITREX) 100 MG tablet Take 1 tablet (100 mg total)  by mouth every 2 (two) hours as needed for migraine. May repeat in 2 hours if headache persists or recurs. 9 tablet 1  . TREMFYA 100 MG/ML SOPN Inject into the skin.    .Marland Kitchentriamcinolone cream (KENALOG) 0.1 % SMARTSIG:1 Application Topical 2-3 Times Daily    . valACYclovir (VALTREX) 500 MG tablet Take 1 tablet (500 mg total) by mouth daily. Increase to bid x 3 days with any symptoms. 120 tablet 4   No current facility-administered medications on file prior to visit.    ALLERGIES: Codeine, Sulfa antibiotics, and Tramadol  Family History  Problem Relation Age of Onset  . Thyroid disease Mother        and maternal side  . Hypertension Mother   . Heart Problems Mother        heart arythmia  . Breast cancer Mother 648      had negative genetic testing  . Diabetes Father   . Throat cancer Maternal Grandfather   . Cervical cancer Paternal Grandmother   . Heart attack Maternal Uncle   . Thrombocytopenia Sister        ITP     Review of Systems  Constitutional: Negative.   HENT: Negative.   Eyes: Negative.   Respiratory: Negative.   Cardiovascular: Negative.   Gastrointestinal: Negative.   Endocrine: Negative.   Genitourinary: Negative.  Musculoskeletal: Negative.   Skin: Negative.   Allergic/Immunologic: Negative.   Neurological: Negative.   Hematological: Negative.   Psychiatric/Behavioral: Negative.     PHYSICAL EXAMINATION:    Wt 275 lb (124.7 kg)   LMP 01/10/2021 (Exact Date)   BMI 39.18 kg/m     General appearance: alert, cooperative, no acute distress  CV:  Regular rate and rhythm Lungs:  clear to auscultation, no wheezes, rales or rhonchi, symmetric air entry  Assessment: Menorrhagia with associated anemia Trial of Norethindrone x 3 months Premenstrual feeling of flu-like and achy  Plan: Continue Norethindrone for now Continue to monitor bleeding/PMS symptoms Discussed possible use of Mirena IUD. Although had discussed with Dr. Sabra Heck in past and didn't  like the idea, she will take information and thick about it more. CBC drawn today F/U as needed

## 2021-02-06 ENCOUNTER — Other Ambulatory Visit: Payer: Self-pay

## 2021-02-06 ENCOUNTER — Ambulatory Visit: Payer: Managed Care, Other (non HMO) | Admitting: Nurse Practitioner

## 2021-02-06 ENCOUNTER — Encounter: Payer: Self-pay | Admitting: Nurse Practitioner

## 2021-02-06 VITALS — BP 114/64 | HR 68 | Resp 16 | Wt 275.0 lb

## 2021-02-06 DIAGNOSIS — N920 Excessive and frequent menstruation with regular cycle: Secondary | ICD-10-CM | POA: Diagnosis not present

## 2021-02-06 LAB — CBC
HCT: 42.5 % (ref 35.0–45.0)
Hemoglobin: 14.1 g/dL (ref 11.7–15.5)
MCH: 28.6 pg (ref 27.0–33.0)
MCHC: 33.2 g/dL (ref 32.0–36.0)
MCV: 86.2 fL (ref 80.0–100.0)
MPV: 10.4 fL (ref 7.5–12.5)
Platelets: 288 10*3/uL (ref 140–400)
RBC: 4.93 10*6/uL (ref 3.80–5.10)
RDW: 11.8 % (ref 11.0–15.0)
WBC: 6.7 10*3/uL (ref 3.8–10.8)

## 2021-04-10 ENCOUNTER — Inpatient Hospital Stay: Payer: Managed Care, Other (non HMO) | Attending: Oncology

## 2021-04-10 ENCOUNTER — Inpatient Hospital Stay: Payer: Managed Care, Other (non HMO) | Admitting: Oncology

## 2021-04-10 ENCOUNTER — Other Ambulatory Visit: Payer: Self-pay

## 2021-04-10 VITALS — BP 144/88 | HR 84 | Temp 98.0°F | Resp 17 | Ht 70.25 in | Wt 277.2 lb

## 2021-04-10 DIAGNOSIS — D500 Iron deficiency anemia secondary to blood loss (chronic): Secondary | ICD-10-CM | POA: Insufficient documentation

## 2021-04-10 DIAGNOSIS — N920 Excessive and frequent menstruation with regular cycle: Secondary | ICD-10-CM | POA: Diagnosis not present

## 2021-04-10 DIAGNOSIS — D509 Iron deficiency anemia, unspecified: Secondary | ICD-10-CM | POA: Diagnosis not present

## 2021-04-10 LAB — CBC WITH DIFFERENTIAL (CANCER CENTER ONLY)
Abs Immature Granulocytes: 0.02 10*3/uL (ref 0.00–0.07)
Basophils Absolute: 0.1 10*3/uL (ref 0.0–0.1)
Basophils Relative: 1 %
Eosinophils Absolute: 0.1 10*3/uL (ref 0.0–0.5)
Eosinophils Relative: 2 %
HCT: 41.2 % (ref 36.0–46.0)
Hemoglobin: 13.5 g/dL (ref 12.0–15.0)
Immature Granulocytes: 0 %
Lymphocytes Relative: 26 %
Lymphs Abs: 1.6 10*3/uL (ref 0.7–4.0)
MCH: 28.4 pg (ref 26.0–34.0)
MCHC: 32.8 g/dL (ref 30.0–36.0)
MCV: 86.7 fL (ref 80.0–100.0)
Monocytes Absolute: 0.8 10*3/uL (ref 0.1–1.0)
Monocytes Relative: 12 %
Neutro Abs: 3.8 10*3/uL (ref 1.7–7.7)
Neutrophils Relative %: 59 %
Platelet Count: 296 10*3/uL (ref 150–400)
RBC: 4.75 MIL/uL (ref 3.87–5.11)
RDW: 12.7 % (ref 11.5–15.5)
WBC Count: 6.4 10*3/uL (ref 4.0–10.5)
nRBC: 0 % (ref 0.0–0.2)

## 2021-04-10 LAB — IRON AND TIBC
Iron: 85 ug/dL (ref 28–170)
Saturation Ratios: 22 % (ref 10.4–31.8)
TIBC: 391 ug/dL (ref 250–450)
UIBC: 306 ug/dL

## 2021-04-10 LAB — FERRITIN: Ferritin: 22 ng/mL (ref 11–307)

## 2021-04-10 NOTE — Progress Notes (Signed)
Hematology and Oncology Follow Up Visit  Sandy [name] [id number] June 09, 1971 50 y.o. 04/10/2021 10:03 AM  CC: [name], M.D.    Principle Diagnosis:  50 year old woman with iron deficiency anemia related to chronic menstrual blood losses diagnosed in 2012.   Previous therapy: She is status post intravenous iron infusion as needed.  Last infusion given in November 2021.  Current therapy: Oral iron replacement.      Interim History: [name] is here for a follow-up visit.  Since the last visit, she reports no major changes in her health.  She continues to have heavy menstrual cycles but the duration has decreased with hormone therapy prescribed by her gynecologist.  She denies any hematochezia, melena or hemoptysis.  She denies any recent hospitalization or illnesses.    .     Medications: Updated on review. Current Outpatient Medications  Medication Sig Dispense Refill  . cyclobenzaprine (FLEXERIL) 10 MG tablet Take by mouth.    . DERMA-SMOOTHE/FS BODY 0.01 % OIL Apply  a small amount to affected area twice a day  for 1-2 weeks    . Multiple Vitamin (MULTIVITAMIN) tablet Take 1 tablet by mouth daily.    . naproxen (NAPROSYN) 500 MG tablet Take by mouth.    . norethindrone (MICRONOR) 0.35 MG tablet Take 1 tablet (0.35 mg total) by mouth daily. 84 tablet 4  . SUMAtriptan (IMITREX) 100 MG tablet Take 1 tablet (100 mg total) by mouth every 2 (two) hours as needed for migraine. May repeat in 2 hours if headache persists or recurs. 9 tablet 1  . TREMFYA 100 MG/ML SOPN Inject into the skin.    Marland Kitchen triamcinolone cream (KENALOG) 0.1 % SMARTSIG:1 Application Topical 2-3 Times Daily    . valACYclovir (VALTREX) 500 MG tablet Take 1 tablet (500 mg total) by mouth daily. Increase to bid x 3 days with any symptoms. 120 tablet 4   No current facility-administered medications for this visit.    Allergies:  Allergies  Allergen Reactions  . Codeine Nausea And Vomiting    Excessive  Sweating  . Sulfa Antibiotics   . Tramadol Nausea And Vomiting        Physical Exam:  Blood pressure (!) 144/88, pulse 84, temperature 98 F (36.7 C), temperature source Tympanic, resp. rate 17, height 5' 10.25" (1.784 m), weight 277 lb 3.2 oz (125.7 kg), SpO2 99 %.    ECOG: 0    General appearance: Comfortable appearing without any discomfort Head: Normocephalic without any trauma Oropharynx: Mucous membranes are moist and pink without any thrush or ulcers. Eyes: Pupils are equal and round reactive to light. Lymph nodes: No cervical, supraclavicular, inguinal or axillary lymphadenopathy.   Heart:regular rate and rhythm.  S1 and S2 without leg edema. Lung: Clear without any rhonchi or wheezes.  No dullness to percussion. Abdomin: Soft, nontender, nondistended with good bowel sounds.  No hepatosplenomegaly. Musculoskeletal: No joint deformity or effusion.  Full range of motion noted. Neurological: No deficits noted on motor, sensory and deep tendon reflex exam. Skin: No petechial rash or dryness.  Appeared moist.           Lab Results: Lab Results  Component Value Date   WBC 6.7 02/06/2021   HGB 14.1 02/06/2021   HCT 42.5 02/06/2021   MCV 86.2 02/06/2021   PLT 288 02/06/2021            Results for [name], [name] (MRN [id number]) as of 04/10/2021 10:05  Ref. Range 10/10/2020 10:19  Iron  Latest Ref Range: 41 - 142 ug/dL 99  UIBC Latest Ref Range: 120 - 384 ug/dL 290  TIBC Latest Ref Range: 236 - 444 ug/dL 389  Saturation Ratios Latest Ref Range: 21 - 57 % 25  Ferritin Latest Ref Range: 11 - 307 ng/mL 18      Impression and Plan:   50 year old woman with:  1.  Iron deficiency anemia related to chronic menstrual blood losses diagnosed in 2012.  She is status post IV iron infusion in November 2021 without any major complications.  Her CBC from March 2022 showed a hemoglobin of 14.1.  Her hemoglobin today continues to be within normal range currently at  13.5 with iron studies are pending.  Risks and benefits of repeat intravenous iron were discussed.  Complications include arthralgias, myalgias and rarely anaphylaxis were reiterated.  We will await the results of her iron levels and a repeat iron as needed.   2.  Heavy menstrual cycles: She continues to follow with gynecology regarding this issue.  3.  follow-up: In 6 months for reevaluation.   30 minutes were spent on this visit.  The time was dedicated to reviewing disease status, laboratory data discussion and treatment options for the future.    Zola Button 5/5/202210:03 AM

## 2021-10-08 ENCOUNTER — Other Ambulatory Visit: Payer: Self-pay | Admitting: Obstetrics & Gynecology

## 2021-10-08 DIAGNOSIS — Z1231 Encounter for screening mammogram for malignant neoplasm of breast: Secondary | ICD-10-CM

## 2021-10-09 ENCOUNTER — Inpatient Hospital Stay: Payer: Managed Care, Other (non HMO) | Attending: Oncology

## 2021-10-09 ENCOUNTER — Inpatient Hospital Stay: Payer: Managed Care, Other (non HMO) | Admitting: Oncology

## 2021-10-09 ENCOUNTER — Other Ambulatory Visit: Payer: Self-pay

## 2021-10-09 VITALS — BP 122/66 | HR 76 | Temp 97.7°F | Resp 20 | Wt 275.9 lb

## 2021-10-09 DIAGNOSIS — N920 Excessive and frequent menstruation with regular cycle: Secondary | ICD-10-CM | POA: Insufficient documentation

## 2021-10-09 DIAGNOSIS — D500 Iron deficiency anemia secondary to blood loss (chronic): Secondary | ICD-10-CM | POA: Insufficient documentation

## 2021-10-09 DIAGNOSIS — D509 Iron deficiency anemia, unspecified: Secondary | ICD-10-CM | POA: Diagnosis not present

## 2021-10-09 LAB — CBC WITH DIFFERENTIAL (CANCER CENTER ONLY)
Abs Immature Granulocytes: 0.04 10*3/uL (ref 0.00–0.07)
Basophils Absolute: 0.1 10*3/uL (ref 0.0–0.1)
Basophils Relative: 1 %
Eosinophils Absolute: 0.2 10*3/uL (ref 0.0–0.5)
Eosinophils Relative: 3 %
HCT: 42.7 % (ref 36.0–46.0)
Hemoglobin: 13.7 g/dL (ref 12.0–15.0)
Immature Granulocytes: 1 %
Lymphocytes Relative: 21 %
Lymphs Abs: 1.4 10*3/uL (ref 0.7–4.0)
MCH: 27.5 pg (ref 26.0–34.0)
MCHC: 32.1 g/dL (ref 30.0–36.0)
MCV: 85.6 fL (ref 80.0–100.0)
Monocytes Absolute: 0.8 10*3/uL (ref 0.1–1.0)
Monocytes Relative: 12 %
Neutro Abs: 4.2 10*3/uL (ref 1.7–7.7)
Neutrophils Relative %: 62 %
Platelet Count: 305 10*3/uL (ref 150–400)
RBC: 4.99 MIL/uL (ref 3.87–5.11)
RDW: 13 % (ref 11.5–15.5)
WBC Count: 6.6 10*3/uL (ref 4.0–10.5)
nRBC: 0 % (ref 0.0–0.2)

## 2021-10-09 LAB — IRON AND TIBC
Iron: 116 ug/dL (ref 41–142)
Saturation Ratios: 33 % (ref 21–57)
TIBC: 353 ug/dL (ref 236–444)
UIBC: 236 ug/dL (ref 120–384)

## 2021-10-09 LAB — FERRITIN: Ferritin: 24 ng/mL (ref 11–307)

## 2021-10-09 NOTE — Progress Notes (Signed)
Hematology and Oncology Follow Up Visit  Deborrah A Martinique [id number] [birth year] 50 y.o. 10/09/2021 8:35 AM  CC: [name], M.D.    Principle Diagnosis:  50 year old woman with anemia diagnosed in 2012.  She was found to have iron deficiency anemia related to chronic menstrual blood losses.   Previous therapy: IV iron infusion as needed.  She received Feraheme in November 2021.  Current therapy: Oral iron replacement.      Interim History: Ms. Martin[place] returns today for a follow-up visit.  Since the last visit, she reports feeling well without any major complaints.  She denies any nausea, vomiting or abdominal pain.  She denies any recent hospitalization or illnesses.  She has reported no hematochezia or melena.  Her menstrual cycles have been reasonably controlled although does have a heavy cycle periodically but not consistently.  She is on oral iron replacement although has not been taking it regularly.    .     Medications: Updated on review. Current Outpatient Medications  Medication Sig Dispense Refill   cyclobenzaprine (FLEXERIL) 10 MG tablet Take by mouth.     DERMA-SMOOTHE/FS BODY 0.01 % OIL Apply  a small amount to affected area twice a day  for 1-2 weeks     Multiple Vitamin (MULTIVITAMIN) tablet Take 1 tablet by mouth daily.     naproxen (NAPROSYN) 500 MG tablet Take by mouth.     norethindrone (MICRONOR) 0.35 MG tablet Take 1 tablet (0.35 mg total) by mouth daily. 84 tablet 4   SUMAtriptan (IMITREX) 100 MG tablet Take 1 tablet (100 mg total) by mouth every 2 (two) hours as needed for migraine. May repeat in 2 hours if headache persists or recurs. 9 tablet 1   TREMFYA 100 MG/ML SOPN Inject into the skin.     triamcinolone cream (KENALOG) 0.1 % SMARTSIG:1 Application Topical 2-3 Times Daily     valACYclovir (VALTREX) 500 MG tablet Take 1 tablet (500 mg total) by mouth daily. Increase to bid x 3 days with any symptoms. 120 tablet 4   No current facility-administered  medications for this visit.    Allergies:  Allergies  Allergen Reactions   Codeine Nausea And Vomiting    Excessive Sweating   Sulfa Antibiotics    Tramadol Nausea And Vomiting        Physical Exam:   Blood pressure 122/66, pulse 76, temperature 97.7 F (36.5 C), resp. rate 20, weight 275 lb 14.4 oz (125.1 kg), SpO2 100 %.    ECOG: 0      General appearance: Alert, awake without any distress. Head: Atraumatic without abnormalities Oropharynx: Without any thrush or ulcers. Eyes: No scleral icterus. Lymph nodes: No lymphadenopathy noted in the cervical, supraclavicular, or axillary nodes Heart:regular rate and rhythm, without any murmurs or gallops.   Lung: Clear to auscultation without any rhonchi, wheezes or dullness to percussion. Abdomin: Soft, nontender without any shifting dullness or ascites. Musculoskeletal: No clubbing or cyanosis. Neurological: No motor or sensory deficits.            Lab Results: Lab Results  Component Value Date   WBC 6.4 04/10/2021   HGB 13.5 04/10/2021   HCT 41.2 04/10/2021   MCV 86.7 04/10/2021   PLT 296 04/10/2021              Impression and Plan:   50 year old woman with:  1.  Anemia related to iron deficiency and chronic menstrual blood losses diagnosed in 2012.  She has received intravenous iron infusion in  the past and currently on oral iron replacement therapy.  Risks and benefits of repeat intravenous iron infusion were discussed.  Complications that include arthralgias, myalgias and rarely anaphylaxis.  We will await the results of her iron studies before making a determination.  Laboratory data from today reviewed and showed adequate hematological parameters with normal hemoglobin.  Iron studies are currently pending.  2.  Heavy menstrual cycles: Reasonably controlled at this time with hormone therapy.  3.  follow-up: She will return in 6 months for a follow-up visit.   30 minutes were dedicated to  this encounter.  Time spent on reviewing laboratory data, disease status update and outlining future plan of care.    Roxy Cedar Mailani Degroote 11/3/20228:35 AM

## 2021-10-22 ENCOUNTER — Telehealth: Payer: Self-pay | Admitting: Oncology

## 2021-10-22 NOTE — Telephone Encounter (Signed)
Sch per 11/16 , left msg

## 2021-11-06 IMAGING — DX DG CHEST 2V
2 series · 2 of 2 positions shown · non-contrast
Comparison: 12/12/2013

CLINICAL DATA: Chest pain

EXAM:
CHEST - 2 VIEW

[chest pa]
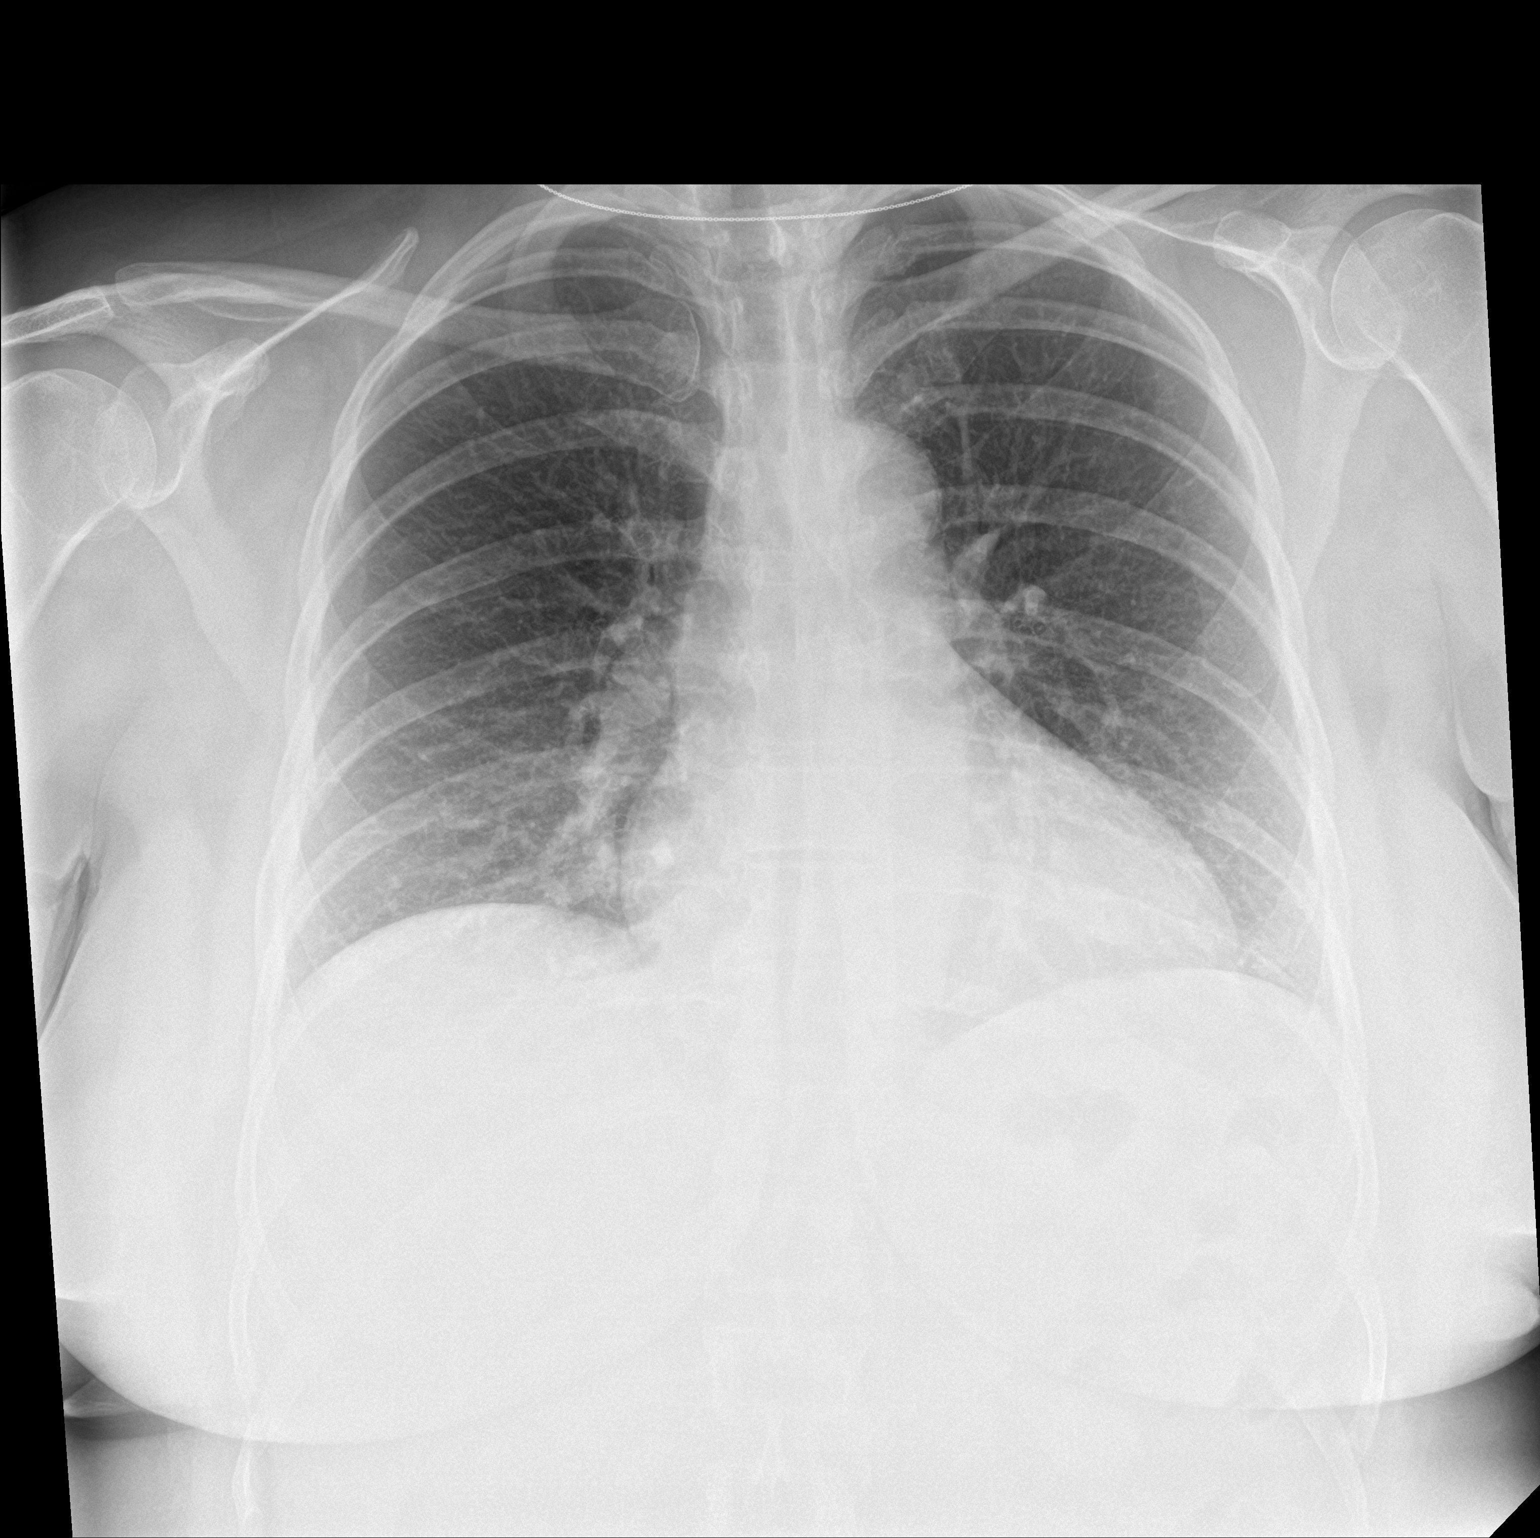

[chest lat]
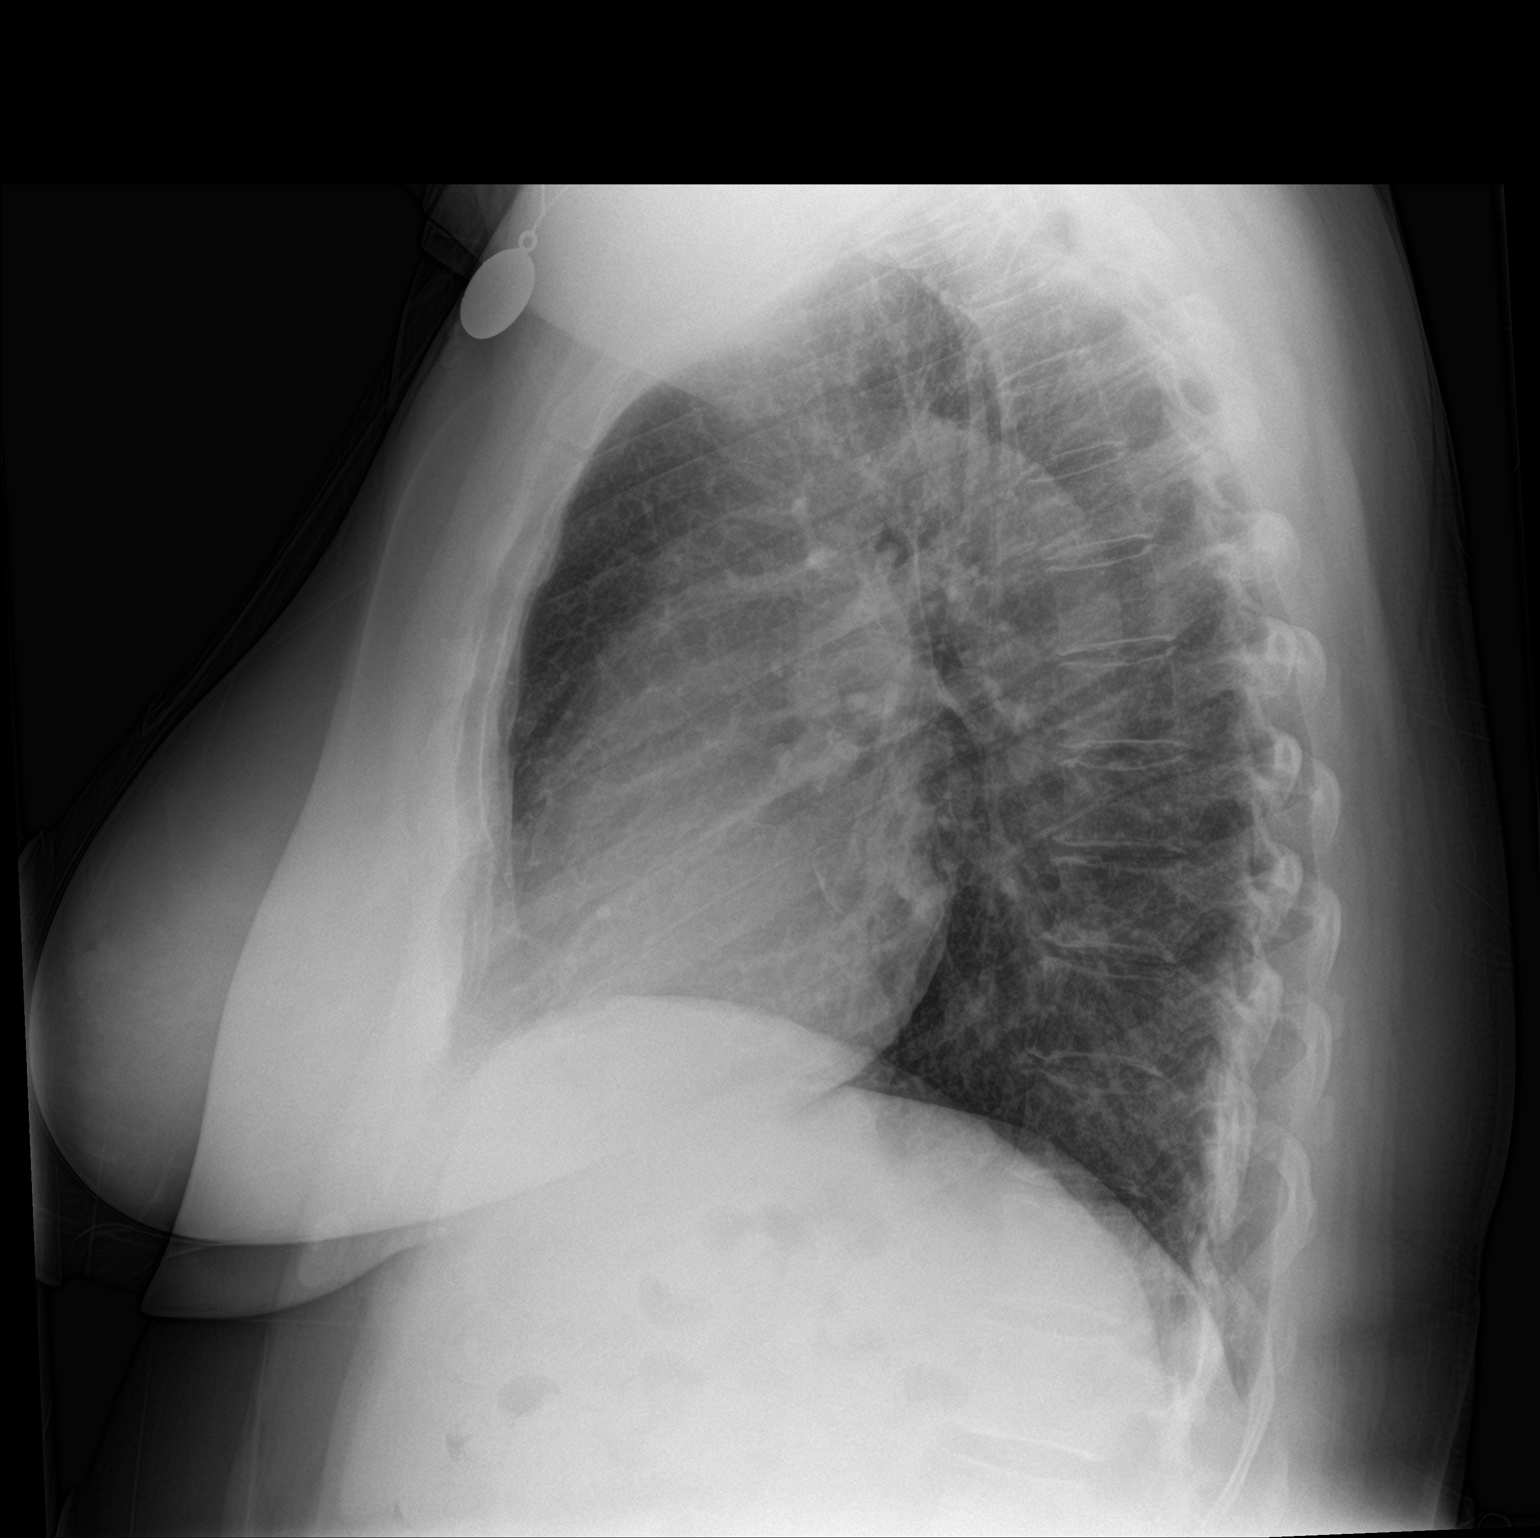

[2 of 2 positions shown; findings below may reference images not displayed]

FINDINGS: Lungs are clear.  No pleural effusion or pneumothorax.

The heart is normal in size.

Visualized osseous structures are within normal limits.
IMPRESSION: Normal chest radiographs.

## 2021-11-12 ENCOUNTER — Ambulatory Visit
Admission: RE | Admit: 2021-11-12 | Discharge: 2021-11-12 | Disposition: A | Discharge status: Home / Self Care | Payer: Managed Care, Other (non HMO) | Source: Ambulatory Visit | Attending: Obstetrics & Gynecology | Admitting: Obstetrics & Gynecology

## 2021-11-12 ENCOUNTER — Encounter: Payer: Self-pay | Admitting: Oncology

## 2021-11-12 DIAGNOSIS — Z1231 Encounter for screening mammogram for malignant neoplasm of breast: Secondary | ICD-10-CM

## 2021-12-24 ENCOUNTER — Encounter (HOSPITAL_BASED_OUTPATIENT_CLINIC_OR_DEPARTMENT_OTHER): Payer: Self-pay | Admitting: Obstetrics & Gynecology

## 2021-12-24 ENCOUNTER — Other Ambulatory Visit: Payer: Self-pay

## 2021-12-24 ENCOUNTER — Ambulatory Visit (INDEPENDENT_AMBULATORY_CARE_PROVIDER_SITE_OTHER): Payer: Managed Care, Other (non HMO) | Admitting: Obstetrics & Gynecology

## 2021-12-24 ENCOUNTER — Other Ambulatory Visit (HOSPITAL_COMMUNITY)
Admission: RE | Admit: 2021-12-24 | Discharge: 2021-12-24 | Disposition: A | Discharge status: Home / Self Care | Payer: Managed Care, Other (non HMO) | Source: Ambulatory Visit | Attending: Obstetrics & Gynecology | Admitting: Obstetrics & Gynecology

## 2021-12-24 VITALS — BP 135/92 | HR 77 | Ht 71.0 in | Wt 276.2 lb

## 2021-12-24 DIAGNOSIS — B009 Herpesviral infection, unspecified: Secondary | ICD-10-CM | POA: Diagnosis not present

## 2021-12-24 DIAGNOSIS — N920 Excessive and frequent menstruation with regular cycle: Secondary | ICD-10-CM

## 2021-12-24 DIAGNOSIS — Z01419 Encounter for gynecological examination (general) (routine) without abnormal findings: Secondary | ICD-10-CM | POA: Diagnosis not present

## 2021-12-24 DIAGNOSIS — Z124 Encounter for screening for malignant neoplasm of cervix: Secondary | ICD-10-CM

## 2021-12-24 DIAGNOSIS — N871 Moderate cervical dysplasia: Secondary | ICD-10-CM

## 2021-12-24 DIAGNOSIS — D500 Iron deficiency anemia secondary to blood loss (chronic): Secondary | ICD-10-CM

## 2021-12-24 DIAGNOSIS — Z0000 Encounter for general adult medical examination without abnormal findings: Secondary | ICD-10-CM

## 2021-12-24 DIAGNOSIS — G43831 Menstrual migraine, intractable, with status migrainosus: Secondary | ICD-10-CM | POA: Diagnosis not present

## 2021-12-24 DIAGNOSIS — Z1211 Encounter for screening for malignant neoplasm of colon: Secondary | ICD-10-CM

## 2021-12-24 DIAGNOSIS — Z23 Encounter for immunization: Secondary | ICD-10-CM

## 2021-12-24 DIAGNOSIS — Z9189 Other specified personal risk factors, not elsewhere classified: Secondary | ICD-10-CM

## 2021-12-24 LAB — LIPID PANEL
Chol/HDL Ratio: 4.5 ratio — ABNORMAL HIGH (ref 0.0–4.4)
Cholesterol, Total: 224 mg/dL — ABNORMAL HIGH (ref 100–199)
HDL: 50 mg/dL (ref >39)
LDL Chol Calc (NIH): 146 mg/dL — ABNORMAL HIGH (ref 0–99)
Triglycerides: 156 mg/dL — ABNORMAL HIGH (ref 0–149)
VLDL Cholesterol Cal: 28 mg/dL (ref 5–40)

## 2021-12-24 LAB — HEMOGLOBIN A1C
Est. average glucose Bld gHb Est-mCnc: 120 mg/dL
Hgb A1c MFr Bld: 5.8 % — ABNORMAL HIGH (ref 4.8–5.6)

## 2021-12-24 MED ORDER — VALACYCLOVIR HCL 500 MG PO TABS: 500.0000 mg | ORAL_TABLET | Freq: Every day | ORAL | 12 refills | Status: DC

## 2021-12-24 MED ORDER — RIZATRIPTAN BENZOATE 5 MG PO TBDP: 5.0000 mg | ORAL_TABLET | ORAL | 3 refills | Status: DC | PRN

## 2021-12-24 MED ORDER — NORETHINDRONE ACETATE 5 MG PO TABS: 2.5000 mg | ORAL_TABLET | Freq: Every day | ORAL | 3 refills | Status: DC

## 2021-12-24 MED ORDER — SUMATRIPTAN SUCCINATE 100 MG PO TABS: 100.0000 mg | ORAL_TABLET | ORAL | 1 refills | Status: AC | PRN

## 2021-12-24 NOTE — Progress Notes (Signed)
51 y.o. G0P0000 Single White or Caucasian female here for annual exam.  Doing well.  Reports menstrual cycles are still heavy.  She started micronor last year.  She thinks this helped a little.    Patient's last menstrual period was 12/19/2021.          Sexually active: No.  The current method of family planning is oral progesterone-only contraceptive.    Exercising: no Smoker:  no  Health Maintenance: Pap:  09/05/2019 Negative History of abnormal Pap:  h/o cin 2 MMG:  11/12/2021 Negative Colonoscopy:  cologuard ordered BMD:   not indicated Screening Labs: today   reports that she has never smoked. She has never used smokeless tobacco. She reports current alcohol use of about 1.0 standard drink per week. She reports that she does not use drugs.  Past Medical History:  Diagnosis Date   Abnormal Pap smear    H/O CIN II   Anemia    with iron infusions   Back pain    Costochondritis    Migraines    Psoriasis    STD (sexually transmitted disease)    HSV II    Past Surgical History:  Procedure Laterality Date   CRYOTHERAPY  2009   of cx   DERMOID CYST  EXCISION  1992   left ? ovary/appendectomy   OVARIAN CYST REMOVAL  2000   urethral stretching  1993    Current Outpatient Medications  Medication Sig Dispense Refill   cyclobenzaprine (FLEXERIL) 10 MG tablet Take by mouth.     DERMA-SMOOTHE/FS BODY 0.01 % OIL Apply  a small amount to affected area twice a day  for 1-2 weeks     Multiple Vitamin (MULTIVITAMIN) tablet Take 1 tablet by mouth daily.     naproxen (NAPROSYN) 500 MG tablet Take by mouth.     norethindrone (AYGESTIN) 5 MG tablet Take 0.5 tablets (2.5 mg total) by mouth daily. 90 tablet 3   rizatriptan (MAXALT-MLT) 5 MG disintegrating tablet Take 1 tablet (5 mg total) by mouth as needed for migraine. May repeat in 2 hours if needed 10 tablet 3   TREMFYA 100 MG/ML SOPN Inject into the skin.     triamcinolone cream (KENALOG) 0.1 % SMARTSIG:1 Application Topical 2-3  Times Daily     SUMAtriptan (IMITREX) 100 MG tablet Take 1 tablet (100 mg total) by mouth every 2 (two) hours as needed for migraine. May repeat in 2 hours if headache persists or recurs. 9 tablet 1   valACYclovir (VALTREX) 500 MG tablet Take 1 tablet (500 mg total) by mouth daily. Increase to bid x 3 days with any symptoms. 30 tablet 12   No current facility-administered medications for this visit.    Family History  Problem Relation Age of Onset   Thyroid disease Mother        and maternal side   Hypertension Mother    Heart Problems Mother        heart arythmia   Breast cancer Mother 60       had negative genetic testing   Diabetes Father    Throat cancer Maternal Grandfather    Cervical cancer Paternal Grandmother    Heart attack Maternal Uncle    Thrombocytopenia Sister        ITP    Review of Systems  All other systems reviewed and are negative.  Exam:   BP (!) 135/92 (BP Location: Left Arm, Patient Position: Sitting, Cuff Size: Large)    Pulse 77  Ht 5' 11"  (1.803 m) Comment: reported   Wt 276 lb 3.2 oz (125.3 kg)    LMP 12/19/2021    BMI 38.52 kg/m   Height: 5' 11"  (180.3 cm) (reported)  General appearance: alert, cooperative and appears stated age Head: Normocephalic, without obvious abnormality, atraumatic Neck: no adenopathy, supple, symmetrical, trachea midline and thyroid normal to inspection and palpation Lungs: clear to auscultation bilaterally Breasts: normal appearance, no masses or tenderness Heart: regular rate and rhythm Abdomen: soft, non-tender; bowel sounds normal; no masses,  no organomegaly Extremities: extremities normal, atraumatic, no cyanosis or edema Skin: Skin color, texture, turgor normal. No rashes or lesions Lymph nodes: Cervical, supraclavicular, and axillary nodes normal. No abnormal inguinal nodes palpated Neurologic: Grossly normal   Pelvic: External genitalia:  no lesions              Urethra:  normal appearing urethra with no  masses, tenderness or lesions              Bartholins and Skenes: normal                 Vagina: normal appearing vagina with normal color and no discharge, no lesions              Cervix: no lesions              Pap taken: Yes.   Bimanual Exam:  Uterus:  normal size, contour, position, consistency, mobility, non-tender              Adnexa: normal adnexa and no mass, fullness, tenderness               Rectovaginal: Confirms               Anus:  normal sphincter tone, no lesions  Chaperone, Octaviano Batty, CMA, was present for exam.  Assessment/Plan: 1. Well woman exam with routine gynecological exam - pap and HR HPV obtained today - MMG 11/2021 - cologuard ordered.  Declines colonoscopy at this time. - tdap will be updated - lab work ordered  2. Iron deficiency anemia due to chronic blood loss - followed by hematology  3. Menstrual migraine, with intractable migraine, so stated, with status migrainosus - will try different medication for treatment due to side effects with Imitrex - rizatriptan (MAXALT-MLT) 5 MG disintegrating tablet; Take 1 tablet (5 mg total) by mouth as needed for migraine. May repeat in 2 hours if needed  Dispense: 10 tablet; Refill: 3  4. HSV-2 infection - valACYclovir (VALTREX) 500 MG tablet; Take 1 tablet (500 mg total) by mouth daily. Increase to bid x 3 days with any symptoms.  Dispense: 30 tablet; Refill: 12  5. Colon cancer screening - Cologuard  6. Menorrhagia with regular cycle - will increase dosage to see if can help with bleeding - norethindrone (AYGESTIN) 5 MG tablet; Take 0.5 tablets (2.5 mg total) by mouth daily.  Dispense: 90 tablet; Refill: 3  7. Blood tests for routine general physical examination - Lipid panel - Hemoglobin A1c  8. 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  Increased risk of breast cancer with lifetime risk >20% - have discussed breast MRI in the past and pt has declined

## 2021-12-29 LAB — CYTOLOGY - PAP
Comment: NEGATIVE
Diagnosis: NEGATIVE
High risk HPV: NEGATIVE

## 2022-01-09 LAB — COLOGUARD: COLOGUARD: NEGATIVE

## 2022-02-27 ENCOUNTER — Ambulatory Visit (HOSPITAL_BASED_OUTPATIENT_CLINIC_OR_DEPARTMENT_OTHER): Payer: Managed Care, Other (non HMO) | Admitting: Obstetrics & Gynecology

## 2022-02-27 ENCOUNTER — Encounter (HOSPITAL_BASED_OUTPATIENT_CLINIC_OR_DEPARTMENT_OTHER): Payer: Self-pay | Admitting: Obstetrics & Gynecology

## 2022-02-27 ENCOUNTER — Other Ambulatory Visit: Payer: Self-pay

## 2022-02-27 VITALS — BP 128/76 | HR 86 | Ht 71.0 in | Wt 281.4 lb

## 2022-02-27 DIAGNOSIS — L02421 Furuncle of right axilla: Secondary | ICD-10-CM | POA: Diagnosis not present

## 2022-02-27 MED ORDER — DOXYCYCLINE HYCLATE 100 MG PO CAPS: 100.0000 mg | ORAL_CAPSULE | Freq: Two times a day (BID) | ORAL | 0 refills | Status: DC

## 2022-02-27 NOTE — Progress Notes (Signed)
GYNECOLOGY  VISIT ? ?CC:   axillary nodule ? ?HPI: ?51 y.o. G0P0000 Single White or Caucasian female here for complaint of tender axillary nodules that have been present for a couple of weeks.  No drainage.  Has not changed any deodorants recently.  Does have family hx of breast cancer so felt this should be evaluated.  Last MMG 11/12/2021. ? ? ?Patient Active Problem List  ? Diagnosis Date Noted  ? Iron deficiency anemia 10/10/2019  ? Plantar fasciitis 01/01/2017  ? Psoriasis 10/03/2015  ? ? ?Past Medical History:  ?Diagnosis Date  ? Abnormal Pap smear   ? H/O CIN II  ? Anemia   ? with iron infusions  ? Back pain   ? Costochondritis   ? Migraines   ? Psoriasis   ? STD (sexually transmitted disease)   ? HSV II  ? ? ?Past Surgical History:  ?Procedure Laterality Date  ? CRYOTHERAPY  2009  ? of cx  ? DERMOID CYST  EXCISION  1992  ? left ? ovary/appendectomy  ? OVARIAN CYST REMOVAL  2000  ? urethral stretching  1993  ? ? ?MEDS:   ?Current Outpatient Medications on File Prior to Visit  ?Medication Sig Dispense Refill  ? cyclobenzaprine (FLEXERIL) 10 MG tablet Take by mouth.    ? DERMA-SMOOTHE/FS BODY 0.01 % OIL Apply  a small amount to affected area twice a day  for 1-2 weeks    ? Multiple Vitamin (MULTIVITAMIN) tablet Take 1 tablet by mouth daily.    ? norethindrone (AYGESTIN) 5 MG tablet Take 0.5 tablets (2.5 mg total) by mouth daily. 90 tablet 3  ? rizatriptan (MAXALT-MLT) 5 MG disintegrating tablet Take 1 tablet (5 mg total) by mouth as needed for migraine. May repeat in 2 hours if needed 10 tablet 3  ? SUMAtriptan (IMITREX) 100 MG tablet Take 1 tablet (100 mg total) by mouth every 2 (two) hours as needed for migraine. May repeat in 2 hours if headache persists or recurs. 9 tablet 1  ? TREMFYA 100 MG/ML SOPN Inject into the skin.    ? triamcinolone cream (KENALOG) 0.1 % SMARTSIG:1 Application Topical 2-3 Times Daily    ? valACYclovir (VALTREX) 500 MG tablet Take 1 tablet (500 mg total) by mouth daily. Increase to bid  x 3 days with any symptoms. 30 tablet 12  ? ?No current facility-administered medications on file prior to visit.  ? ? ?ALLERGIES: Codeine, Sulfa antibiotics, and Tramadol ? ?Family History  ?Problem Relation Age of Onset  ? Thyroid disease Mother   ?     and maternal side  ? Hypertension Mother   ? Heart Problems Mother   ?     heart arythmia  ? Breast cancer Mother 35  ?     had negative genetic testing  ? Diabetes Father   ? Throat cancer Maternal Grandfather   ? Cervical cancer Paternal Grandmother   ? Heart attack Maternal Uncle   ? Thrombocytopenia Sister   ?     ITP  ? ? ?SH:  single, non smoker ? ?Review of Systems  ?Constitutional: Negative.   ? ?PHYSICAL EXAMINATION:   ? ?BP 128/76 (BP Location: Left Arm, Patient Position: Sitting, Cuff Size: Large)   Pulse 86   Ht 5' 11"  (1.803 m) Comment: reported  Wt 281 lb 6.4 oz (127.6 kg)   BMI 39.25 kg/m?     ?Physical Exam ?Constitutional:   ?   Appearance: Normal appearance.  ?Chest:  ?Breasts: ?  Right: Normal. No swelling, bleeding, inverted nipple, mass, nipple discharge, skin change or tenderness.  ?   Left: Normal. No swelling, bleeding, inverted nipple, mass, nipple discharge, skin change or tenderness.  ?   Comments: Right axillar with at least three <1cm superficial, mildly tender nodules. ?Lymphadenopathy:  ?   Upper Body:  ?   Right upper body: No supraclavicular or axillary adenopathy.  ?   Left upper body: No supraclavicular or axillary adenopathy.  ?Neurological:  ?   General: No focal deficit present.  ?   Mental Status: She is alert.  ?Psychiatric:     ?   Mood and Affect: Mood normal.  ?  ? ?Assessment/Plan: ?1. Furuncle of right axilla ?- warm compressed and antibiotics will be started.  Pt will give update in 1 week.   ?- if do not improve, consider breast imaging vs general surgeon consult ?- doxycycline (VIBRAMYCIN) 100 MG capsule; Take 1 capsule (100 mg total) by mouth 2 (two) times daily. Take with food as can cause GI distress.   Dispense: 14 capsule; Refill: 0  ? ? ?

## 2022-03-12 ENCOUNTER — Telehealth (HOSPITAL_BASED_OUTPATIENT_CLINIC_OR_DEPARTMENT_OTHER): Payer: Self-pay | Admitting: Obstetrics & Gynecology

## 2022-03-12 NOTE — Telephone Encounter (Signed)
Patient called an would like the nurse call her so she can give her the update .Patient stated Dr.Miller wanted her to call in within 2 weeks with update.  ?

## 2022-03-12 NOTE — Telephone Encounter (Signed)
LMOVM returning pts call ?

## 2022-03-16 ENCOUNTER — Encounter (HOSPITAL_BASED_OUTPATIENT_CLINIC_OR_DEPARTMENT_OTHER): Payer: Self-pay | Admitting: Obstetrics & Gynecology

## 2022-03-16 NOTE — Telephone Encounter (Signed)
LMOVM returning pts call ?

## 2022-03-23 ENCOUNTER — Other Ambulatory Visit (HOSPITAL_BASED_OUTPATIENT_CLINIC_OR_DEPARTMENT_OTHER): Payer: Self-pay | Admitting: Obstetrics & Gynecology

## 2022-03-23 DIAGNOSIS — R2231 Localized swelling, mass and lump, right upper limb: Secondary | ICD-10-CM

## 2022-04-06 ENCOUNTER — Ambulatory Visit
Admission: RE | Admit: 2022-04-06 | Discharge: 2022-04-06 | Disposition: A | Discharge status: Home / Self Care | Payer: Managed Care, Other (non HMO) | Source: Ambulatory Visit | Attending: Obstetrics & Gynecology | Admitting: Obstetrics & Gynecology

## 2022-04-06 ENCOUNTER — Other Ambulatory Visit (HOSPITAL_BASED_OUTPATIENT_CLINIC_OR_DEPARTMENT_OTHER): Payer: Self-pay | Admitting: Obstetrics & Gynecology

## 2022-04-06 DIAGNOSIS — R2231 Localized swelling, mass and lump, right upper limb: Secondary | ICD-10-CM

## 2022-04-09 ENCOUNTER — Inpatient Hospital Stay: Payer: Managed Care, Other (non HMO) | Attending: Oncology

## 2022-04-09 ENCOUNTER — Other Ambulatory Visit: Payer: Self-pay

## 2022-04-09 ENCOUNTER — Inpatient Hospital Stay: Payer: Managed Care, Other (non HMO) | Admitting: Oncology

## 2022-04-09 VITALS — BP 117/78 | HR 66 | Temp 97.8°F | Resp 17 | Ht 71.0 in | Wt 284.4 lb

## 2022-04-09 DIAGNOSIS — D509 Iron deficiency anemia, unspecified: Secondary | ICD-10-CM

## 2022-04-09 DIAGNOSIS — D500 Iron deficiency anemia secondary to blood loss (chronic): Secondary | ICD-10-CM | POA: Diagnosis not present

## 2022-04-09 DIAGNOSIS — N920 Excessive and frequent menstruation with regular cycle: Secondary | ICD-10-CM | POA: Insufficient documentation

## 2022-04-09 LAB — FERRITIN: Ferritin: 45 ng/mL (ref 11–307)

## 2022-04-09 LAB — CBC WITH DIFFERENTIAL (CANCER CENTER ONLY)
Abs Immature Granulocytes: 0.05 10*3/uL (ref 0.00–0.07)
Basophils Absolute: 0.1 10*3/uL (ref 0.0–0.1)
Basophils Relative: 1 %
Eosinophils Absolute: 0.2 10*3/uL (ref 0.0–0.5)
Eosinophils Relative: 2 %
HCT: 41.5 % (ref 36.0–46.0)
Hemoglobin: 13.8 g/dL (ref 12.0–15.0)
Immature Granulocytes: 1 %
Lymphocytes Relative: 22 %
Lymphs Abs: 1.7 10*3/uL (ref 0.7–4.0)
MCH: 28.9 pg (ref 26.0–34.0)
MCHC: 33.3 g/dL (ref 30.0–36.0)
MCV: 86.8 fL (ref 80.0–100.0)
Monocytes Absolute: 0.7 10*3/uL (ref 0.1–1.0)
Monocytes Relative: 10 %
Neutro Abs: 4.8 10*3/uL (ref 1.7–7.7)
Neutrophils Relative %: 64 %
Platelet Count: 276 10*3/uL (ref 150–400)
RBC: 4.78 MIL/uL (ref 3.87–5.11)
RDW: 13 % (ref 11.5–15.5)
WBC Count: 7.6 10*3/uL (ref 4.0–10.5)
nRBC: 0 % (ref 0.0–0.2)

## 2022-04-09 NOTE — Progress Notes (Signed)
Hematology and Oncology Follow Up Visit ? ?Sandy [name] ?[id number] ?Apr 11, 1971 51 y.o. ?04/09/2022 8:27 AM ? ?CC: Sandy [name]. Sandy [name], M.D.  ? ? ?Principle Diagnosis:  51 year old woman with iron deficiency anemia related to menstrual blood losses diagnosed in 2012.   ? ? ?Previous therapy: IV iron infusion as needed.  She received Feraheme in November 2021. ? ?Current therapy: Oral iron replacement.  ? ? ? ? ?Interim History: Sandy [name] is here for return evaluation.  Since last visit, she reports feeling well without any major complaints.  She is no longer reporting any menstrual bleeding at this time and her last menstrual cycle in January of this year.  She denies any hematochezia melena or hemoptysis.  She denies any hospitalizations or illnesses. ? ? ? ?. ? ? ? ? ?Medications: Reviewed without changes. ?Current Outpatient Medications  ?Medication Sig Dispense Refill  ? cyclobenzaprine (FLEXERIL) 10 MG tablet Take by mouth.    ? DERMA-SMOOTHE/FS BODY 0.01 % OIL Apply  a small amount to affected area twice a day  for 1-2 weeks    ? doxycycline (VIBRAMYCIN) 100 MG capsule Take 1 capsule (100 mg total) by mouth 2 (two) times daily. Take with food as can cause GI distress. 14 capsule 0  ? Multiple Vitamin (MULTIVITAMIN) tablet Take 1 tablet by mouth daily.    ? norethindrone (AYGESTIN) 5 MG tablet Take 0.5 tablets (2.5 mg total) by mouth daily. 90 tablet 3  ? rizatriptan (MAXALT-MLT) 5 MG disintegrating tablet Take 1 tablet (5 mg total) by mouth as needed for migraine. May repeat in 2 hours if needed 10 tablet 3  ? SUMAtriptan (IMITREX) 100 MG tablet Take 1 tablet (100 mg total) by mouth every 2 (two) hours as needed for migraine. May repeat in 2 hours if headache persists or recurs. 9 tablet 1  ? TREMFYA 100 MG/ML SOPN Inject into the skin.    ? triamcinolone cream (KENALOG) 0.1 % SMARTSIG:1 Application Topical 2-3 Times Daily    ? valACYclovir (VALTREX) 500 MG tablet Take 1 tablet (500 mg total) by mouth daily.  Increase to bid x 3 days with any symptoms. 30 tablet 12  ? ?No current facility-administered medications for this visit.  ? ? ?Allergies:  ?Allergies  ?Allergen Reactions  ? Codeine Nausea And Vomiting  ?  Excessive Sweating  ? Sulfa Antibiotics   ? Tramadol Nausea And Vomiting  ? ? ? ? ? ? ?Physical Exam: ? ? ? ?Blood pressure 117/78, pulse 66, temperature 97.8 ?F (36.6 ?C), temperature source Temporal, resp. rate 17, height 5' 11"  (1.803 m), weight 284 lb 6.4 oz (129 kg), SpO2 100 %. ? ? ? ?ECOG: 0 ? ? ?General appearance: Comfortable appearing without any discomfort ?Head: Normocephalic without any trauma ?Oropharynx: Mucous membranes are moist and pink without any thrush or ulcers. ?Eyes: Pupils are equal and round reactive to light. ?Lymph nodes: No cervical, supraclavicular, inguinal or axillary lymphadenopathy.   ?Heart:regular rate and rhythm.  S1 and S2 without leg edema. ?Lung: Clear without any rhonchi or wheezes.  No dullness to percussion. ?Abdomin: Soft, nontender, nondistended with good bowel sounds.  No hepatosplenomegaly. ?Musculoskeletal: No joint deformity or effusion.  Full range of motion noted. ?Neurological: No deficits noted on motor, sensory and deep tendon reflex exam. ?Skin: No petechial rash or dryness.  Appeared moist.  ? ? ? ? ? ? ? ? ? ? ? ?Lab Results: ?Lab Results  ?Component Value Date  ? WBC 6.6 10/09/2021  ? HGB  13.7 10/09/2021  ? HCT 42.7 10/09/2021  ? MCV 85.6 10/09/2021  ? PLT 305 10/09/2021  ? ?   ?   ? ? ? ? ? ?Impression and Plan: ? ? ?51 year old woman with: ? ?1.  Iron deficiency anemia related to chronic menstrual blood losses diagnosed in 2012. ? ?Iron studies from November 2022 showed normal findings with normal hemoglobin at that time.  The natural course of this disease was discussed at this time and treatment options were reiterated.  The role of intravenous iron were discussed at this time but given her overall normal source and control menstrual bleeding I see no  immediate need for intravenous iron.  Hemoglobin from today continues to be normal with iron studies are currently pending.  It is likely that she will not require further iron replacement at this time. ? ?2.  Heavy menstrual cycles: Does not appear to be an issue at this time.  She is no longer having any bleeding. ? ?3.  follow-up: Happy to see her in the future as needed. ? ? ?20 minutes were spent on this visit.  The time was dedicated to reviewing laboratory data, disease status update and future treatment plans. ? ? ? ?Sandy [name] ?5/4/20238:27 AM  ?

## 2022-10-15 ENCOUNTER — Other Ambulatory Visit: Payer: Self-pay | Admitting: Obstetrics & Gynecology

## 2022-10-15 DIAGNOSIS — Z1231 Encounter for screening mammogram for malignant neoplasm of breast: Secondary | ICD-10-CM

## 2022-11-24 ENCOUNTER — Ambulatory Visit
Admission: RE | Admit: 2022-11-24 | Discharge: 2022-11-24 | Disposition: A | Discharge status: Home / Self Care | Payer: Managed Care, Other (non HMO) | Source: Ambulatory Visit | Attending: Obstetrics & Gynecology | Admitting: Obstetrics & Gynecology

## 2022-11-24 DIAGNOSIS — Z1231 Encounter for screening mammogram for malignant neoplasm of breast: Secondary | ICD-10-CM

## 2022-12-30 ENCOUNTER — Ambulatory Visit (HOSPITAL_BASED_OUTPATIENT_CLINIC_OR_DEPARTMENT_OTHER): Payer: Managed Care, Other (non HMO) | Admitting: Obstetrics & Gynecology

## 2023-01-18 ENCOUNTER — Encounter (HOSPITAL_BASED_OUTPATIENT_CLINIC_OR_DEPARTMENT_OTHER): Payer: Self-pay | Admitting: Obstetrics & Gynecology

## 2023-01-18 ENCOUNTER — Ambulatory Visit (HOSPITAL_BASED_OUTPATIENT_CLINIC_OR_DEPARTMENT_OTHER): Payer: Managed Care, Other (non HMO) | Admitting: Obstetrics & Gynecology

## 2023-01-18 VITALS — BP 137/76 | HR 108 | Ht 71.0 in | Wt 283.6 lb

## 2023-01-18 DIAGNOSIS — N912 Amenorrhea, unspecified: Secondary | ICD-10-CM

## 2023-01-18 DIAGNOSIS — Z6839 Body mass index (BMI) 39.0-39.9, adult: Secondary | ICD-10-CM | POA: Diagnosis not present

## 2023-01-18 DIAGNOSIS — Z87410 Personal history of cervical dysplasia: Secondary | ICD-10-CM

## 2023-01-18 DIAGNOSIS — B009 Herpesviral infection, unspecified: Secondary | ICD-10-CM | POA: Diagnosis not present

## 2023-01-18 DIAGNOSIS — Z01419 Encounter for gynecological examination (general) (routine) without abnormal findings: Secondary | ICD-10-CM | POA: Diagnosis not present

## 2023-01-18 DIAGNOSIS — Z8639 Personal history of other endocrine, nutritional and metabolic disease: Secondary | ICD-10-CM

## 2023-01-18 DIAGNOSIS — G43009 Migraine without aura, not intractable, without status migrainosus: Secondary | ICD-10-CM

## 2023-01-18 DIAGNOSIS — N920 Excessive and frequent menstruation with regular cycle: Secondary | ICD-10-CM

## 2023-01-18 MED ORDER — NORETHINDRONE ACETATE 5 MG PO TABS: 2.5000 mg | ORAL_TABLET | Freq: Every day | ORAL | 3 refills | Status: DC

## 2023-01-18 MED ORDER — RIZATRIPTAN BENZOATE 5 MG PO TBDP: 5.0000 mg | ORAL_TABLET | ORAL | 11 refills | Status: AC | PRN

## 2023-01-18 NOTE — Progress Notes (Unsigned)
52 y.o. G0P0000 Single White or Caucasian female here for annual exam.  Work is stressful.  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    Needs some blood work done today.  Feels it's probably not going to look good, given the amount of stress she's experiencing.  Denies vaginal bleeding.  On aygestin 5.11m daily.  Bleeding pretty much stopped after increasing dosage last year.  But now feels like she is having a lot of heat intolerance.  Face will be very red at times.    Needs RF for maxalt.  Not having a lot of headache but when she does, she does need to take this.  No LMP recorded.          Sexually active: No.  The current method of family planning is post menopausal status.    Smoker:  no  Health Maintenance: Pap:  12/24/2021 Negative History of abnormal Pap:  h/o cin 2 MMG:  11/24/2022 Negative Colonoscopy:  did cologuard 12/31/2021 BMD:   not indicated Screening Labs: ordered today   reports that she has never smoked. She has never used smokeless tobacco. She reports current alcohol use of about 1.0 standard drink of alcohol per week. She reports that she does not use drugs.  Past Medical History:  Diagnosis Date   Abnormal Pap smear    H/O CIN II   Anemia    with iron infusions   Back pain    Costochondritis    Migraines    Psoriasis    STD (sexually transmitted disease)    HSV II    Past Surgical History:  Procedure Laterality Date   CRYOTHERAPY  2009   of cx   DERMOID CYST  EXCISION  1992   left ? ovary/appendectomy   OVARIAN CYST REMOVAL  2000   urethral stretching  1993    Current Outpatient Medications  Medication Sig Dispense Refill   norethindrone (AYGESTIN) 5 MG tablet Take 0.5 tablets (2.5 mg total) by mouth daily. 90 tablet 3   rizatriptan (MAXALT-MLT) 5 MG disintegrating tablet Take 1 tablet (5 mg total) by mouth as needed for migraine. May repeat in 2 hours if needed 10 tablet 3   SUMAtriptan (IMITREX) 100 MG tablet Take 1 tablet (100 mg total) by  mouth every 2 (two) hours as needed for migraine. May repeat in 2 hours if headache persists or recurs. 9 tablet 1   TREMFYA 100 MG/ML SOPN Inject into the skin.     valACYclovir (VALTREX) 500 MG tablet Take 1 tablet (500 mg total) by mouth daily. Increase to bid x 3 days with any symptoms. 30 tablet 12   Multiple Vitamin (MULTIVITAMIN) tablet Take 1 tablet by mouth daily. (Patient not taking: Reported on 01/18/2023)     No current facility-administered medications for this visit.    Family History  Problem Relation Age of Onset   Thyroid disease Mother        and maternal side   Hypertension Mother    Heart Problems Mother        heart arythmia   Breast cancer Mother 622      had negative genetic testing   Diabetes Father    Throat cancer Maternal Grandfather    Cervical cancer Paternal Grandmother    Heart attack Maternal Uncle    Thrombocytopenia Sister        ITP    ROS: Constitutional: negative Genitourinary:negative  Exam:   BP 137/76 (BP Location: Right Arm, Patient  Position: Sitting, Cuff Size: Large)   Pulse (!) 108   Ht 5' 11"$  (1.803 m) Comment: Reported  Wt 283 lb 9.6 oz (128.6 kg)   BMI 39.55 kg/m   Height: 5' 11"$  (180.3 cm) (Reported)  General appearance: alert, cooperative and appears stated age Head: Normocephalic, without obvious abnormality, atraumatic Neck: no adenopathy, supple, symmetrical, trachea midline and thyroid normal to inspection and palpation Lungs: clear to auscultation bilaterally Breasts: normal appearance, no masses or tenderness Heart: regular rate and rhythm Abdomen: soft, non-tender; bowel sounds normal; no masses,  no organomegaly Extremities: extremities normal, atraumatic, no cyanosis or edema Skin: Skin color, texture, turgor normal. No rashes or lesions Lymph nodes: Cervical, supraclavicular, and axillary nodes normal. No abnormal inguinal nodes palpated Neurologic: Grossly normal   Pelvic: External genitalia:  no lesions               Urethra:  normal appearing urethra with no masses, tenderness or lesions              Bartholins and Skenes: normal                 Vagina: normal appearing vagina with normal color and no discharge, no lesions              Cervix: no lesions              Pap taken: No. Bimanual Exam:  Uterus:  normal size, contour, position, consistency, mobility, non-tender              Adnexa: normal adnexa and no mass, fullness, tenderness               Rectovaginal: Confirms               Anus:  normal sphincter tone, no lesions  Chaperone, Octaviano Batty, CMA, was present for exam.  Assessment/Plan: 1. Well woman exam with routine gynecological exam - Pap smear negative 12/24/2021 - Mammogram 11/24/2022 - Colonoscopy has not been done but negative cologuard 12/31/2021 - Bone mineral density not indicated yet - lab work ordered as per below - vaccines reviewed/updated  2. BMI 39.0-39.9,adult - CBC - Comprehensive metabolic panel - Lipid panel - TSH - Hemoglobin A1c  3. HSV-2 infection - does not need valtrex RF  4. Amenorrhea - Follicle stimulating hormone  5. Migraine without aura and without status migrainosus, not intractable - rizatriptan (MAXALT-MLT) 5 MG disintegrating tablet; Take 1 tablet (5 mg total) by mouth as needed for migraine. May repeat in 2 hours if needed  Dispense: 10 tablet; Refill: 11  6. History of iron deficiency - Iron, TIBC and Ferritin Panel  7. History of cervical dysplasia - guidelines for pap smear/HR HPV testing reviewed  8. H/o menorrhagia with regular cycle - bleeding well controlled with aygestin - norethindrone (AYGESTIN) 5 MG tablet; Take 0.5 tablets (2.5 mg total) by mouth daily.  Dispense: 90 tablet; Refill: 3

## 2023-01-18 NOTE — Patient Instructions (Signed)
[facility] [place],  [place]  [id number]  Main: [phone]

## 2023-01-19 LAB — HEMOGLOBIN A1C
Est. average glucose Bld gHb Est-mCnc: 120 mg/dL
Hgb A1c MFr Bld: 5.8 % — ABNORMAL HIGH (ref 4.8–5.6)

## 2023-01-19 LAB — COMPREHENSIVE METABOLIC PANEL
ALT: 23 IU/L (ref 0–32)
AST: 18 IU/L (ref 0–40)
Albumin/Globulin Ratio: 1.4 (ref 1.2–2.2)
Albumin: 4.2 g/dL (ref 3.9–4.9)
Alkaline Phosphatase: 72 IU/L (ref 44–121)
BUN/Creatinine Ratio: 12 (ref 9–23)
BUN: 9 mg/dL (ref 6–24)
Bilirubin Total: 0.7 mg/dL (ref 0.0–1.2)
CO2: 21 mmol/L (ref 20–29)
Calcium: 10.4 mg/dL — ABNORMAL HIGH (ref 8.7–10.2)
Chloride: 99 mmol/L (ref 96–106)
Creatinine, Ser: 0.74 mg/dL (ref 0.57–1.00)
Globulin, Total: 2.9 g/dL (ref 1.5–4.5)
Glucose: 90 mg/dL (ref 70–99)
Potassium: 4.3 mmol/L (ref 3.5–5.2)
Sodium: 136 mmol/L (ref 134–144)
Total Protein: 7.1 g/dL (ref 6.0–8.5)
eGFR: 99 mL/min/{1.73_m2} (ref >59)

## 2023-01-19 LAB — TSH: TSH: 1.63 u[IU]/mL (ref 0.450–4.500)

## 2023-01-19 LAB — CBC
Hematocrit: 43.2 % (ref 34.0–46.6)
Hemoglobin: 14.6 g/dL (ref 11.1–15.9)
MCH: 28.7 pg (ref 26.6–33.0)
MCHC: 33.8 g/dL (ref 31.5–35.7)
MCV: 85 fL (ref 79–97)
Platelets: 338 10*3/uL (ref 150–450)
RBC: 5.09 x10E6/uL (ref 3.77–5.28)
RDW: 11.8 % (ref 11.7–15.4)
WBC: 10.2 10*3/uL (ref 3.4–10.8)

## 2023-01-19 LAB — FOLLICLE STIMULATING HORMONE: FSH: 2.5 m[IU]/mL

## 2023-01-19 LAB — IRON,TIBC AND FERRITIN PANEL
Ferritin: 111 ng/mL (ref 15–150)
Iron Saturation: 43 % (ref 15–55)
Iron: 153 ug/dL (ref 27–159)
Total Iron Binding Capacity: 356 ug/dL (ref 250–450)
UIBC: 203 ug/dL (ref 131–425)

## 2023-01-19 LAB — LIPID PANEL
Chol/HDL Ratio: 5.3 ratio — ABNORMAL HIGH (ref 0.0–4.4)
Cholesterol, Total: 228 mg/dL — ABNORMAL HIGH (ref 100–199)
HDL: 43 mg/dL (ref >39)
LDL Chol Calc (NIH): 156 mg/dL — ABNORMAL HIGH (ref 0–99)
Triglycerides: 158 mg/dL — ABNORMAL HIGH (ref 0–149)
VLDL Cholesterol Cal: 29 mg/dL (ref 5–40)

## 2023-01-25 ENCOUNTER — Encounter: Payer: Self-pay | Admitting: *Deleted

## 2023-02-03 ENCOUNTER — Other Ambulatory Visit: Payer: Self-pay | Admitting: Obstetrics & Gynecology

## 2023-02-03 ENCOUNTER — Other Ambulatory Visit (HOSPITAL_BASED_OUTPATIENT_CLINIC_OR_DEPARTMENT_OTHER): Payer: Managed Care, Other (non HMO)

## 2023-02-04 LAB — PTH, INTACT AND CALCIUM
Calcium: 10.1 mg/dL (ref 8.7–10.2)
PTH: 41 pg/mL (ref 15–65)

## 2023-05-12 ENCOUNTER — Telehealth: Payer: Self-pay | Admitting: Family

## 2023-05-12 ENCOUNTER — Ambulatory Visit
Admission: RE | Admit: 2023-05-12 | Discharge: 2023-05-12 | Disposition: A | Discharge status: Home / Self Care | Payer: Managed Care, Other (non HMO) | Source: Ambulatory Visit | Attending: Family

## 2023-05-12 ENCOUNTER — Ambulatory Visit: Payer: Managed Care, Other (non HMO) | Admitting: Family

## 2023-05-12 ENCOUNTER — Encounter: Payer: Self-pay | Admitting: Family

## 2023-05-12 VITALS — BP 124/80 | HR 84 | Temp 98.0°F | Ht 71.0 in | Wt 283.8 lb

## 2023-05-12 DIAGNOSIS — M542 Cervicalgia: Secondary | ICD-10-CM | POA: Diagnosis not present

## 2023-05-12 DIAGNOSIS — M722 Plantar fascial fibromatosis: Secondary | ICD-10-CM | POA: Diagnosis not present

## 2023-05-12 DIAGNOSIS — M79661 Pain in right lower leg: Secondary | ICD-10-CM

## 2023-05-12 DIAGNOSIS — E782 Mixed hyperlipidemia: Secondary | ICD-10-CM | POA: Insufficient documentation

## 2023-05-12 DIAGNOSIS — S99922A Unspecified injury of left foot, initial encounter: Secondary | ICD-10-CM

## 2023-05-12 DIAGNOSIS — M898X6 Other specified disorders of bone, lower leg: Secondary | ICD-10-CM

## 2023-05-12 DIAGNOSIS — M62838 Other muscle spasm: Secondary | ICD-10-CM

## 2023-05-12 DIAGNOSIS — R7303 Prediabetes: Secondary | ICD-10-CM | POA: Diagnosis not present

## 2023-05-12 DIAGNOSIS — M2550 Pain in unspecified joint: Secondary | ICD-10-CM | POA: Diagnosis not present

## 2023-05-12 DIAGNOSIS — Z87828 Personal history of other (healed) physical injury and trauma: Secondary | ICD-10-CM

## 2023-05-12 DIAGNOSIS — L409 Psoriasis, unspecified: Secondary | ICD-10-CM

## 2023-05-12 DIAGNOSIS — S99921A Unspecified injury of right foot, initial encounter: Secondary | ICD-10-CM

## 2023-05-12 LAB — VITAMIN D 25 HYDROXY (VIT D DEFICIENCY, FRACTURES): VITD: 21.81 ng/mL — ABNORMAL LOW (ref 30.00–100.00)

## 2023-05-12 MED ORDER — CYCLOBENZAPRINE HCL 10 MG PO TABS: ORAL_TABLET | ORAL | 0 refills | Status: DC
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
## 2023-05-12 NOTE — Assessment & Plan Note (Signed)
Continue f/u with dermatologist and tremfya as prescribed.  Workup for autoimmune, suspected possible PSA pending results, may consider rheum referral.

## 2023-05-12 NOTE — Telephone Encounter (Signed)
Completely missed ordering the autoimmune panel at todays visit. Any chance we can add ana, sed rate, and or rf? If no please forward to my [facility] pool so they can get pt to come back in for lab only. Orders placed in future.   

## 2023-05-12 NOTE — Assessment & Plan Note (Signed)
Ordering xray tibia fibula r/o acute findings. Pending results.  If negative will consider u/s of the leg to r/o superficial cause.

## 2023-05-12 NOTE — Patient Instructions (Signed)
------------------------------------   Stop by the lab prior to leaving today. I will notify you of your results once received.    ------------------------------------  Welcome to our clinic, I am happy to have you as my new patient. I am excited to continue on this healthcare journey with you.  Stop by the lab prior to leaving today. I will notify you of your results once received.   Please keep in mind Any my chart messages you send have up to a three business day turnaround for a response.  Phone calls may take up to a one full business day turnaround for a  response.   If you need a medication refill I recommend you request it through the pharmacy as this is easiest for us rather than sending a message and or phone call.   Due to recent changes in healthcare laws, you may see results of your imaging and/or laboratory studies on MyChart before I have had a chance to review them.  I understand that in some cases there may be results that are confusing or concerning to you. Please understand that not all results are received at the same time and often I may need to interpret multiple results in order to provide you with the best plan of care or course of treatment. Therefore, I ask that you please give me 2 business days to thoroughly review all your results before contacting my office for clarification. Should we see a critical lab result, you will be contacted sooner.   It was a pleasure seeing you today! Please do not hesitate to reach out with any questions and or concerns.  Regards,   Yeraldin Litzenberger FNP-C  

## 2023-05-12 NOTE — Assessment & Plan Note (Signed)
Stable  Intermittent flares

## 2023-05-12 NOTE — Assessment & Plan Note (Signed)
Dg foot today to r/u acute injury  Ice/keep immobilized, wear supportive shoes prn

## 2023-05-12 NOTE — Assessment & Plan Note (Addendum)
Will order lipid panel next visit as pt not fasting. Work on low cholesterol diet and exercise as tolerated

## 2023-05-13 ENCOUNTER — Other Ambulatory Visit: Payer: Self-pay | Admitting: Family

## 2023-05-13 DIAGNOSIS — E559 Vitamin D deficiency, unspecified: Secondary | ICD-10-CM

## 2023-05-13 MED ORDER — CHOLECALCIFEROL 1.25 MG (50000 UT) PO TABS: 1.0000 | ORAL_TABLET | ORAL | 0 refills | Status: DC

## 2023-05-13 NOTE — Telephone Encounter (Signed)
I apologize Dr. Clent Ridges, We have a Clent Ridges in our office.

## 2023-05-13 NOTE — Telephone Encounter (Signed)
Completely missed ordering the autoimmune panel at todays visit. Any chance we can add ana, sed rate, and or rf? If no please forward to my [facility] pool so they can get pt to come back in for lab only. Orders placed in future.

## 2023-05-13 NOTE — Telephone Encounter (Signed)
Lab appt has been scheduled for next week, as this is the earliest she can come in.

## 2023-05-19 ENCOUNTER — Other Ambulatory Visit (INDEPENDENT_AMBULATORY_CARE_PROVIDER_SITE_OTHER): Payer: Managed Care, Other (non HMO)

## 2023-05-19 DIAGNOSIS — E782 Mixed hyperlipidemia: Secondary | ICD-10-CM

## 2023-05-19 DIAGNOSIS — M2550 Pain in unspecified joint: Secondary | ICD-10-CM

## 2023-05-20 ENCOUNTER — Telehealth: Payer: Self-pay | Admitting: Family

## 2023-05-20 LAB — LIPID PANEL
Cholesterol: 232 mg/dL — ABNORMAL HIGH (ref 0–200)
HDL: 33.5 mg/dL — ABNORMAL LOW (ref >39.00)
LDL Cholesterol: 179 mg/dL — ABNORMAL HIGH (ref 0–99)
NonHDL: 198.15
Total CHOL/HDL Ratio: 7
Triglycerides: 98 mg/dL (ref 0.0–149.0)
VLDL: 19.6 mg/dL (ref 0.0–40.0)

## 2023-05-20 LAB — RHEUMATOID FACTOR: Rheumatoid fact SerPl-aCnc: 13 IU/mL (ref <14)

## 2023-05-20 LAB — SEDIMENTATION RATE: Sed Rate: 34 mm/hr — ABNORMAL HIGH (ref 0–30)

## 2023-05-20 NOTE — Telephone Encounter (Signed)
See result note for further documentation.

## 2023-05-20 NOTE — Progress Notes (Signed)
Sed rate mildly elevated however this is typically from inflammation so expected.  Cholesterol is much too high, LDL is 174 and goal <100 I highly suggest we starta  cholesterol medication if pt is agreeable to significantly reduce her risk for heart disease/stroke.

## 2023-05-20 NOTE — Telephone Encounter (Signed)
Patient returned call regarding lab results. Would like a call back 

## 2023-05-21 ENCOUNTER — Other Ambulatory Visit: Payer: Self-pay | Admitting: Family

## 2023-05-21 DIAGNOSIS — Z79899 Other long term (current) drug therapy: Secondary | ICD-10-CM

## 2023-05-21 DIAGNOSIS — E782 Mixed hyperlipidemia: Secondary | ICD-10-CM

## 2023-05-21 MED ORDER — ATORVASTATIN CALCIUM 10 MG PO TABS: 10.0000 mg | ORAL_TABLET | Freq: Every day | ORAL | 3 refills | Status: DC

## 2023-05-25 ENCOUNTER — Other Ambulatory Visit: Payer: Self-pay | Admitting: Family

## 2023-05-25 DIAGNOSIS — R700 Elevated erythrocyte sedimentation rate: Secondary | ICD-10-CM

## 2023-05-25 DIAGNOSIS — R768 Other specified abnormal immunological findings in serum: Secondary | ICD-10-CM

## 2023-05-25 DIAGNOSIS — M2550 Pain in unspecified joint: Secondary | ICD-10-CM

## 2023-05-25 LAB — ANTI-NUCLEAR AB BY IFA (RDL): Anti-Nuclear Ab by IFA (RDL): POSITIVE — AB

## 2023-05-25 LAB — ANA TITER AND PATTERN: Speckled Pattern: 1:80 {titer} — ABNORMAL HIGH

## 2023-07-09 ENCOUNTER — Ambulatory Visit: Payer: Managed Care, Other (non HMO) | Admitting: Internal Medicine

## 2023-07-09 ENCOUNTER — Encounter: Payer: Self-pay | Admitting: Internal Medicine

## 2023-07-09 VITALS — BP 116/82 | HR 86 | Temp 97.7°F | Ht 71.0 in | Wt 279.0 lb

## 2023-07-09 DIAGNOSIS — M25561 Pain in right knee: Secondary | ICD-10-CM | POA: Diagnosis not present

## 2023-07-09 NOTE — Assessment & Plan Note (Signed)
Findings suggest partial medial meniscus tear Continue aleve Recommended acute visit at [facility] today---may benefit from cortisone injection
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
## 2023-07-27 ENCOUNTER — Other Ambulatory Visit: Payer: Self-pay | Admitting: Family

## 2023-07-27 DIAGNOSIS — E559 Vitamin D deficiency, unspecified: Secondary | ICD-10-CM

## 2023-08-13 ENCOUNTER — Other Ambulatory Visit (INDEPENDENT_AMBULATORY_CARE_PROVIDER_SITE_OTHER): Payer: Managed Care, Other (non HMO)

## 2023-08-13 ENCOUNTER — Other Ambulatory Visit: Payer: Self-pay | Admitting: Family

## 2023-08-13 DIAGNOSIS — Z79899 Other long term (current) drug therapy: Secondary | ICD-10-CM | POA: Diagnosis not present

## 2023-08-13 DIAGNOSIS — E559 Vitamin D deficiency, unspecified: Secondary | ICD-10-CM

## 2023-08-13 DIAGNOSIS — E782 Mixed hyperlipidemia: Secondary | ICD-10-CM

## 2023-08-13 LAB — LIPID PANEL
Cholesterol: 168 mg/dL (ref 0–200)
HDL: 45.5 mg/dL (ref >39.00)
LDL Cholesterol: 108 mg/dL — ABNORMAL HIGH (ref 0–99)
NonHDL: 122.99
Total CHOL/HDL Ratio: 4
Triglycerides: 76 mg/dL (ref 0.0–149.0)
VLDL: 15.2 mg/dL (ref 0.0–40.0)

## 2023-08-13 LAB — HEPATIC FUNCTION PANEL
ALT: 17 U/L (ref 0–35)
AST: 14 U/L (ref 0–37)
Albumin: 3.9 g/dL (ref 3.5–5.2)
Alkaline Phosphatase: 62 U/L (ref 39–117)
Bilirubin, Direct: 0.1 mg/dL (ref 0.0–0.3)
Total Bilirubin: 0.7 mg/dL (ref 0.2–1.2)
Total Protein: 6.9 g/dL (ref 6.0–8.3)

## 2023-08-13 LAB — VITAMIN D 25 HYDROXY (VIT D DEFICIENCY, FRACTURES): VITD: 27.33 ng/mL — ABNORMAL LOW (ref 30.00–100.00)

## 2023-08-13 MED ORDER — CHOLECALCIFEROL 1.25 MG (50000 UT) PO TABS: 1.0000 | ORAL_TABLET | ORAL | 0 refills | Status: DC

## 2023-10-11 ENCOUNTER — Other Ambulatory Visit: Payer: Self-pay | Admitting: Physical Medicine and Rehabilitation

## 2023-10-11 DIAGNOSIS — Z1231 Encounter for screening mammogram for malignant neoplasm of breast: Secondary | ICD-10-CM

## 2023-10-22 ENCOUNTER — Encounter: Payer: Self-pay | Admitting: Internal Medicine

## 2023-10-22 ENCOUNTER — Ambulatory Visit: Payer: Managed Care, Other (non HMO)

## 2023-10-22 ENCOUNTER — Ambulatory Visit: Payer: Managed Care, Other (non HMO) | Attending: Internal Medicine | Admitting: Internal Medicine

## 2023-10-22 ENCOUNTER — Ambulatory Visit (INDEPENDENT_AMBULATORY_CARE_PROVIDER_SITE_OTHER): Payer: Managed Care, Other (non HMO)

## 2023-10-22 VITALS — BP 128/83 | HR 82 | Resp 14 | Ht 70.5 in | Wt 276.0 lb

## 2023-10-22 DIAGNOSIS — M19042 Primary osteoarthritis, left hand: Secondary | ICD-10-CM | POA: Diagnosis not present

## 2023-10-22 DIAGNOSIS — R768 Other specified abnormal immunological findings in serum: Secondary | ICD-10-CM | POA: Diagnosis not present

## 2023-10-22 DIAGNOSIS — L409 Psoriasis, unspecified: Secondary | ICD-10-CM

## 2023-10-22 DIAGNOSIS — M1990 Unspecified osteoarthritis, unspecified site: Secondary | ICD-10-CM | POA: Diagnosis not present

## 2023-10-22 DIAGNOSIS — M19041 Primary osteoarthritis, right hand: Secondary | ICD-10-CM | POA: Diagnosis not present

## 2023-10-22 DIAGNOSIS — K760 Fatty (change of) liver, not elsewhere classified: Secondary | ICD-10-CM | POA: Diagnosis not present

## 2023-10-22 MED ORDER — CELECOXIB 100 MG PO CAPS: ORAL_CAPSULE | ORAL | 0 refills | Status: DC
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
## 2023-10-25 LAB — SJOGRENS SYNDROME-B EXTRACTABLE NUCLEAR ANTIBODY: SSB (La) (ENA) Antibody, IgG: <1 AI

## 2023-10-25 LAB — RNP ANTIBODY: Ribonucleic Protein(ENA) Antibody, IgG: <1 AI

## 2023-10-25 LAB — C-REACTIVE PROTEIN: CRP: 17 mg/L — ABNORMAL HIGH (ref <8.0)

## 2023-10-25 LAB — ANTI-SMITH ANTIBODY: ENA SM Ab Ser-aCnc: <1 AI

## 2023-10-25 LAB — SEDIMENTATION RATE: Sed Rate: 17 mm/h (ref 0–30)

## 2023-10-25 LAB — C3 AND C4
C3 Complement: 216 mg/dL — ABNORMAL HIGH (ref 83–193)
C4 Complement: 40 mg/dL (ref 15–57)

## 2023-10-25 LAB — SJOGRENS SYNDROME-A EXTRACTABLE NUCLEAR ANTIBODY: SSA (Ro) (ENA) Antibody, IgG: <1 AI

## 2023-10-25 LAB — CYCLIC CITRUL PEPTIDE ANTIBODY, IGG: Cyclic Citrullin Peptide Ab: <16 U

## 2023-10-25 LAB — ANTI-DNA ANTIBODY, DOUBLE-STRANDED: ds DNA Ab: <1 [IU]/mL

## 2023-10-29 ENCOUNTER — Telehealth: Payer: Self-pay

## 2023-10-29 NOTE — Telephone Encounter (Signed)
Patient contacted the office and states she saw her lab results and would like a review of them. Patient does not currently have a new patient follow up scheduled. Please advise.

## 2023-11-01 ENCOUNTER — Encounter: Payer: Self-pay | Admitting: Family

## 2023-11-01 ENCOUNTER — Ambulatory Visit: Payer: Managed Care, Other (non HMO) | Admitting: Family

## 2023-11-01 VITALS — BP 152/105 | HR 115 | Temp 98.0°F | Ht 70.75 in | Wt 277.0 lb

## 2023-11-01 DIAGNOSIS — F411 Generalized anxiety disorder: Secondary | ICD-10-CM | POA: Diagnosis not present

## 2023-11-01 DIAGNOSIS — G479 Sleep disorder, unspecified: Secondary | ICD-10-CM | POA: Diagnosis not present

## 2023-11-01 DIAGNOSIS — Z638 Other specified problems related to primary support group: Secondary | ICD-10-CM | POA: Diagnosis not present

## 2023-11-01 DIAGNOSIS — F4323 Adjustment disorder with mixed anxiety and depressed mood: Secondary | ICD-10-CM

## 2023-11-01 MED ORDER — HYDROXYZINE PAMOATE 25 MG PO CAPS: 25.0000 mg | ORAL_CAPSULE | Freq: Three times a day (TID) | ORAL | 0 refills | Status: DC | PRN

## 2023-11-01 MED ORDER — TRAZODONE HCL 50 MG PO TABS: 50.0000 mg | ORAL_TABLET | Freq: Every evening | ORAL | 3 refills | Status: DC | PRN

## 2023-11-01 MED ORDER — SERTRALINE HCL 50 MG PO TABS: 50.0000 mg | ORAL_TABLET | Freq: Every day | ORAL | 0 refills | Status: DC

## 2023-11-01 NOTE — Telephone Encounter (Signed)
Addressed in lab result note from 10/22/2023.

## 2023-11-01 NOTE — Progress Notes (Signed)
Established Patient Office Visit  Subjective:   Patient ID: Sandy [name], female    DOB: 08-Nov-1971  Age: 52 y.o. MRN: [id number]  CC:  Chief Complaint  Patient presents with   Medical Management of Chronic Issues    Wants to discuss depression/anxiety.    HPI: Sandy [name] is a 52 y.o. female presenting on 11/01/2023 for Medical Management of Chronic Issues (Wants to discuss depression/anxiety.)  Harder to sleep lately, more easily agitated, crying often, flushed often in her face (states daily) and increased anxiety depression. Will at times need to go into another room just to get away from everything that is just making her more anxious. Thinks she is also having worsening panic attacks. Denies SI HI.   Recently lost three close family members which has been very hard.  Work has been becoming increasingly hard to juggle and just feeling overly burnt out. 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 Does not see a therapist.   Elevated blood pressure in office today, elevated today. Doesn't check at home. Denies cp palp and or sob  Takes some otc meds for stress that do not help       ROS: Negative unless specifically indicated above in HPI.   Relevant past medical history reviewed and updated as indicated.   Allergies and medications reviewed and updated.   Current Outpatient Medications:    albuterol (VENTOLIN HFA) 108 (90 Base) MCG/ACT inhaler, TAKE 2 PUFFS BY MOUTH EVERY 4 TO 6 HOURS AS NEEDED, Disp: , Rfl:    atorvastatin (LIPITOR) 10 MG tablet, Take 1 tablet (10 mg total) by mouth daily., Disp: 90 tablet, Rfl: 3   celecoxib (CELEBREX) 100 MG capsule, Take 1 capsule by mouth as needed up to every 12 hours as needed for joint pain, Disp: 60 capsule, Rfl: 0   Cholecalciferol  (VITAMIN D) 50 MCG (2000 UT) CAPS, Take by mouth., Disp: , Rfl:    cyclobenzaprine (FLEXERIL) 10 MG tablet, Take 1/2 to one tablet po qhs prn muscle spasm, Disp: 30 tablet, Rfl: 0   hydrOXYzine (VISTARIL) 25 MG capsule, Take 1 capsule (25 mg total) by mouth every 8 (eight) hours as needed., Disp: 30 capsule, Rfl: 0   metroNIDAZOLE (METROCREAM) 0.75 % cream, Apply topically 2 (two) times daily., Disp: , Rfl:    norethindrone (AYGESTIN) 5 MG tablet, Take 0.5 tablets (2.5 mg total) by mouth daily., Disp: 90 tablet, Rfl: 3   rizatriptan (MAXALT-MLT) 5 MG disintegrating tablet, Take 1 tablet (5 mg total) by mouth as needed for migraine. May repeat in 2 hours if needed, Disp: 10 tablet, Rfl: 11   sertraline (ZOLOFT) 50 MG tablet, Take 1 tablet (50 mg total) by mouth daily., Disp: 90 tablet, Rfl: 0   SUMAtriptan (IMITREX) 100 MG tablet, Take 1 tablet (100 mg total) by mouth every 2 (two) hours as needed for migraine. May repeat in 2 hours if headache persists or recurs., Disp: 9 tablet, Rfl: 1   traZODone (DESYREL) 50 MG tablet, Take 1 tablet (50 mg total) by mouth at bedtime as needed for sleep., Disp: 30 tablet, Rfl: 3   TREMFYA 100 MG/ML SOPN, Inject into the skin., Disp: , Rfl:    valACYclovir (VALTREX) 500 MG tablet, Take 1 tablet (500 mg total) by mouth daily.  Increase to bid x 3 days with any symptoms., Disp: 30 tablet, Rfl: 12   ZORYVE 0.3 % CREA, Apply topically., Disp: , Rfl:   Allergies  Allergen Reactions   Codeine Nausea And Vomiting    Excessive Sweating   Tramadol Nausea And Vomiting   Sulfa Antibiotics Hives    Objective:   BP (!) 152/105   Pulse (!) 115   Temp 98 F (36.7 C) (Temporal)   Ht 5' 10.75" (1.797 m)   Wt 277 lb (125.6 kg)   SpO2 98%   BMI 38.91 kg/m    Physical Exam Constitutional:      General: She is not in acute distress.    Appearance: Normal appearance. She is normal weight. She is not ill-appearing, toxic-appearing or diaphoretic.  HENT:     Head:  Normocephalic.  Cardiovascular:     Rate and Rhythm: Normal rate and regular rhythm.  Pulmonary:     Effort: Pulmonary effort is normal.     Breath sounds: Normal breath sounds.  Musculoskeletal:        General: Normal range of motion.     Right lower leg: No edema.     Left lower leg: No edema.  Neurological:     General: No focal deficit present.     Mental Status: She is alert and oriented to person, place, and time. Mental status is at baseline.     Cranial Nerves: No facial asymmetry.  Psychiatric:        Attention and Perception: Attention and perception normal.        Mood and Affect: Mood is depressed. Affect is tearful.        Behavior: Behavior normal.        Thought Content: Thought content normal. Thought content does not include homicidal or suicidal ideation. Thought content does not include homicidal or suicidal plan.        Cognition and Memory: Cognition and memory normal.        Judgment: Judgment normal.     Assessment & Plan:  Anxiety state -     Sertraline HCl; Take 1 tablet (50 mg total) by mouth daily.  Dispense: 90 tablet; Refill: 0 -     hydrOXYzine Pamoate; Take 1 capsule (25 mg total) by mouth every 8 (eight) hours as needed.  Dispense: 30 capsule; Refill: 0  Adjustment reaction with anxiety and depression Assessment & Plan: Trial start sertraline 50 mg once daily.  Hydroxyxine prn anxiety attack. Referral placed for therapy.  Orders: -     Ambulatory referral to Psychology -     Sertraline HCl; Take 1 tablet (50 mg total) by mouth daily.  Dispense: 90 tablet; Refill: 0  Grieving family -     Ambulatory referral to Psychology -     Sertraline HCl; Take 1 tablet (50 mg total) by mouth daily.  Dispense: 90 tablet; Refill: 0  Sleep disorder Assessment & Plan: Trial trazodone 50 mg once nightly prn    Orders: -     traZODone HCl; Take 1 tablet (50 mg total) by mouth at bedtime as needed for sleep.  Dispense: 30 tablet; Refill: 3     Follow  up plan: Return in about 3 weeks (around 11/22/2023) for f/u anxiety, f/u depression.  Mort Sawyers, FNP

## 2023-11-01 NOTE — Telephone Encounter (Signed)
Results addressed in note now.

## 2023-11-01 NOTE — Progress Notes (Signed)
CRP elevated at 17 and complement c3 elevated at 216. These are positive markers for inflammation.  Sedimentation rate is normal. The antibody tests for the positive ANA were all negative.  Xrays show osteoarthritis in multiple areas but no erosive type of damage from psoriatic arthritis.

## 2023-11-01 NOTE — Patient Instructions (Addendum)
------------------------------------  Start sertraline 50 mg for anxiety and depression. Take 1/2 tablet by mouth once daily for about one week, then increase to 1 full tablet thereafter.   Taking the medicine as directed and not missing any doses is one of the best things you can do to treat your anxiety/depression.  Here are some things to keep in mind:  Side effects (stomach upset, some increased anxiety) may happen before you notice a benefit.  These side effects typically go away over time. Changes to your dose of medicine or a change in medication all together is sometimes necessary Many people will notice an improvement within two weeks but the full effect of the medication can take up to 4-6 weeksManaging Anxiety, Adult After being diagnosed with anxiety, you may be relieved to know why you have felt or behaved a certain way. You may also feel overwhelmed about the treatment ahead and what it will mean for your life. With care and support, you can manage your anxiety. How to manage lifestyle changes Understanding the difference between stress and anxiety Although stress can play a role in anxiety, it is not the same as anxiety. Stress is your body's reaction to life changes and events, both good and bad. Stress is often caused by something external, such as a deadline, test, or competition. It normally goes away after the event has ended and will last just a few hours. But, stress can be ongoing and can lead to more than just stress. Anxiety is caused by something internal, such as imagining a terrible outcome or worrying that something will go wrong that will greatly upset you. Anxiety often does not go away even after the event is over, and it can become a long-term (chronic) worry. Lowering stress and anxiety Talk with your health care provider or a counselor to learn more about lowering anxiety and stress. They may suggest tension-reduction techniques, such as: Music. Spend time  creating or listening to music that you enjoy and that inspires you. Mindfulness-based meditation. Practice being aware of your normal breaths while not trying to control your breathing. It can be done while sitting or walking. Centering prayer. Focus on a word, phrase, or sacred image that means something to you and brings you peace. Deep breathing. Expand your stomach and inhale slowly through your nose. Hold your breath for 3-5 seconds. Then breathe out slowly, letting your stomach muscles relax. Self-talk. Learn to notice and spot thought patterns that lead to anxiety reactions. Change those patterns to thoughts that feel peaceful. Muscle relaxation. Take time to tense muscles and then relax them. Choose a tension-reduction technique that fits your lifestyle and personality. These techniques take time and practice. Set aside 5-15 minutes a day to do them. Specialized therapists can offer counseling and training in these techniques. The training to help with anxiety may be covered by some insurance plans. Other things you can do to manage stress and anxiety include: Keeping a stress diary. This can help you learn what triggers your reaction and then learn ways to manage your response. Thinking about how you react to certain situations. You may not be able to control everything, but you can control your response. Making time for activities that help you relax and not feeling guilty about spending your time in this way. Doing visual imagery. This involves imagining or creating mental pictures to help you relax. Practicing yoga. Through yoga poses, you can lower tension and relax.  Medicines Medicines for anxiety include: Antidepressant medicines.  These are usually prescribed for long-term daily control. Anti-anxiety medicines. These may be added in severe cases, especially when panic attacks occur. When used together, medicines, psychotherapy, and tension-reduction techniques may be the most  effective treatment. Relationships Relationships can play a big part in helping you recover. Spend more time connecting with trusted friends and family members. Think about going to couples counseling if you have a partner, taking family education classes, or going to family therapy. Therapy can help you and others better understand your anxiety. How to recognize changes in your anxiety Everyone responds differently to treatment for anxiety. Recovery from anxiety happens when symptoms lessen and stop interfering with your daily life at home or work. This may mean that you will start to: Have better concentration and focus. Worry will interfere less in your daily thinking. Sleep better. Be less irritable. Have more energy. Have improved memory. Try to recognize when your condition is getting worse. Contact your provider if your symptoms interfere with home or work and you feel like your condition is not improving. Follow these instructions at home: Activity Exercise. Adults should: Exercise for at least 150 minutes each week. The exercise should increase your heart rate and make you sweat (moderate-intensity exercise). Do strengthening exercises at least twice a week. Get the right amount and quality of sleep. Most adults need 7-9 hours of sleep each night. Lifestyle  Eat a healthy diet that includes plenty of vegetables, fruits, whole grains, low-fat dairy products, and lean protein. Do not eat a lot of foods that are high in fats, added sugars, or salt (sodium). Make choices that simplify your life. Do not use any products that contain nicotine or tobacco. These products include cigarettes, chewing tobacco, and vaping devices, such as e-cigarettes. If you need help quitting, ask your provider. Avoid caffeine, alcohol, and certain over-the-counter cold medicines. These may make you feel worse. Ask your pharmacist which medicines to avoid. General instructions Take over-the-counter and  prescription medicines only as told by your provider. Keep all follow-up visits. This is to make sure you are managing your anxiety well or if you need more support. Where to find support You can get help and support from: Self-help groups. Online and [employer]. A trusted spiritual leader. Couples counseling. Family education classes. Family therapy. Where to find more information You may find that joining a support group helps you deal with your anxiety. The following sources can help you find counselors or support groups near you: Mental Health America: mentalhealthamerica.net Anxiety and Depression Association of [place] (ADAA): adaa.org The [employer] on Mental Illness (NAMI): nami.org Contact a health care provider if: You have a hard time staying focused or finishing tasks. You spend many hours a day feeling worried about everyday life. You are very tired because you cannot stop worrying. You start to have headaches or often feel tense. You have chronic nausea or diarrhea. Get help right away if: Your heart feels like it is racing. You have shortness of breath. You have thoughts of hurting yourself or others. Get help right away if you feel like you may hurt yourself or others, or have thoughts about taking your own life. Go to your nearest emergency room or: Call 911. Call the National Suicide Prevention Lifeline at 915-704-8032 or 988. This is open 24 hours a day. Text the Crisis Text Line at 3063041218. This information is not intended to replace advice given to you by your health care provider. Make sure you discuss any questions you have with  your health care provider. Document Revised: 09/01/2022 Document Reviewed: 03/16/2021 Elsevier Patient Education  2024 Elsevier Inc.  Stopping the medication when you start feeling better often results in a return of symptoms. Most people need to be on medication at least 6-12 months If you start having thoughts of  hurting yourself or others after starting this medicine, please call me immediately.    ------------------------------------  A referral was placed today for psychology Please let us know if you have not heard back within 2 weeks about the referral.   ------------------------------------  Hydroxyxine as needed for anxiety attacks

## 2023-11-08 DIAGNOSIS — G479 Sleep disorder, unspecified: Secondary | ICD-10-CM | POA: Insufficient documentation

## 2023-11-08 DIAGNOSIS — F4323 Adjustment disorder with mixed anxiety and depressed mood: Secondary | ICD-10-CM | POA: Insufficient documentation

## 2023-11-08 NOTE — Assessment & Plan Note (Signed)
Trial trazodone 50 mg once nightly prn

## 2023-11-08 NOTE — Assessment & Plan Note (Signed)
Trial start sertraline 50 mg once daily.  Hydroxyxine prn anxiety attack. Referral placed for therapy.

## 2023-11-23 ENCOUNTER — Other Ambulatory Visit: Payer: Self-pay | Admitting: Family

## 2023-11-23 DIAGNOSIS — G479 Sleep disorder, unspecified: Secondary | ICD-10-CM

## 2023-11-26 ENCOUNTER — Ambulatory Visit
Admission: RE | Admit: 2023-11-26 | Discharge: 2023-11-26 | Disposition: A | Discharge status: Home / Self Care | Payer: Managed Care, Other (non HMO) | Source: Ambulatory Visit | Attending: Physical Medicine and Rehabilitation | Admitting: Physical Medicine and Rehabilitation

## 2023-11-26 ENCOUNTER — Encounter: Payer: Self-pay | Admitting: Oncology

## 2023-11-26 DIAGNOSIS — Z1231 Encounter for screening mammogram for malignant neoplasm of breast: Secondary | ICD-10-CM

## 2023-12-03 ENCOUNTER — Ambulatory Visit: Payer: Self-pay | Admitting: Clinical

## 2023-12-03 ENCOUNTER — Ambulatory Visit (INDEPENDENT_AMBULATORY_CARE_PROVIDER_SITE_OTHER): Payer: 59 | Admitting: Clinical

## 2023-12-03 DIAGNOSIS — F4323 Adjustment disorder with mixed anxiety and depressed mood: Secondary | ICD-10-CM
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
## 2023-12-03 NOTE — Progress Notes (Signed)
                Dezi Schaner, LCSW 

## 2023-12-10 ENCOUNTER — Ambulatory Visit (INDEPENDENT_AMBULATORY_CARE_PROVIDER_SITE_OTHER): Payer: 59 | Admitting: Clinical

## 2023-12-10 DIAGNOSIS — F4323 Adjustment disorder with mixed anxiety and depressed mood: Secondary | ICD-10-CM | POA: Diagnosis not present

## 2023-12-10 NOTE — Progress Notes (Signed)
   Sandy Barthel, LCSW

## 2023-12-10 NOTE — Progress Notes (Signed)
 [name] Counselor/Therapist Progress Note  Patient ID: Sandy [name], MRN: [id number],    Date: 12/10/2023  Time Spent: 1:32pm - 2:15pm : 43 minutes   Treatment Type: Individual Therapy  Reported Symptoms:  Patient reported fatigue, feeling numb  Mental Status Exam: Appearance:  Neat and Well Groomed     Behavior: Appropriate  Motor: Normal  Speech/Language:  Clear and Coherent  Affect: Appropriate  Mood: Patient reported feeling numb  Thought process: normal  Thought content:   WNL  Sensory/Perceptual disturbances:   WNL  Orientation: oriented to person, place, and situation  Attention: Good  Concentration: Good  Memory: WNL  Fund of knowledge:  Good  Insight:   Good  Judgment:  Good  Impulse Control: Good   Risk Assessment: Danger to Self:  No Patient denied current suicidal ideation  Self-injurious Behavior: No Danger to Others: No Patient denied current homicidal ideation Duty to Warn:no Physical Aggression / Violence:No  Access to Firearms a concern: No  Gang Involvement:No   Subjective: Patient stated, dad is still in the hospital, day 16 or 17 now. Patient reported her father is experiencing withdrawal symptoms from alcohol, delirium, and is exhibiting irritability. Patient stated, he's (father) better than he was. Patient reported her father's feeding tube was removed and he is receiving physical therapy. Patient reported patient/mother are worried how her father is going to react without alcohol in the home. Patient reported yesterday was patient's first full day back at work since patient's father was hospitalized. Patient stated, just trying to play catch up in reference to patient's return to work. Patient reported difficulty getting up this morning due to fatigue. Patient stated, its just kind of numb this is just what you've gotta do in response to patient's mood since last session.  Patient stated, it makes sense in response to  diagnosis. Patient stated, its every day for the last 16 days in reference to time spent at the hospital and stated, theres not a lot of time to decompress. Patient stated, I'm not a big sharer, groups that would probably be hard for me in response to participation in a grief/loss support group and stated, that caused me a little bit of anxiety just thinking about it. Patient stated, its ok in response to recommendation for therapy and stated, I'm ok with it I guess. Patient reported she prefers virtual sessions.    Interventions: Motivational Interviewing. Clinician conducted session via caregility video from clinician's home office. Patient provided verbal consent to proceed with telehealth session and is aware of limitations of telephone or video visits. Patient participated in session from patient's vehicle. Reviewed events since last session. Clinician reviewed diagnosis and treatment recommendations. Provided psycho education related to diagnosis and treatment. Discussed clinician's scope of practice and the importance of connecting patient with a provider that can address patient's clinical needs. Clinician will initiate a referral to a provider that can address patient's clinical needs.   Collaboration of Care: Referral or follow-up with counselor/therapist AEB Patient provided verbal consent for Dr. Richerd Ling to review patient's initial assessment for the purpose of assessing for appropriateness of referral  to Dr. Ling for therapy    Diagnosis:Adjustment disorder with mixed anxiety and depressed mood  Plan: Clinician will initiate a referral to a provider that can address patient's clinical needs.   Sandy Seats, LCSW

## 2023-12-20 ENCOUNTER — Encounter: Payer: Self-pay | Admitting: Internal Medicine

## 2023-12-20 ENCOUNTER — Other Ambulatory Visit: Payer: Self-pay | Admitting: Family

## 2023-12-20 ENCOUNTER — Telehealth: Payer: Self-pay | Admitting: Pharmacist

## 2023-12-20 ENCOUNTER — Ambulatory Visit: Payer: Managed Care, Other (non HMO) | Attending: Internal Medicine | Admitting: Internal Medicine

## 2023-12-20 DIAGNOSIS — M1990 Unspecified osteoarthritis, unspecified site: Secondary | ICD-10-CM | POA: Diagnosis not present

## 2023-12-20 DIAGNOSIS — G479 Sleep disorder, unspecified: Secondary | ICD-10-CM

## 2023-12-20 MED ORDER — CELECOXIB 100 MG PO CAPS: ORAL_CAPSULE | ORAL | 1 refills | Status: DC

## 2023-12-20 NOTE — Telephone Encounter (Signed)
 Pending OV note from today, please start Cosentyx Unoready SQ BIV  Dose: 300mg  SQ at Weeks 0, 1, 2, 3, 4, then every 4 weeks thereafter  If Taltz  is preferred, okay to move forward with Taltz . Patient will not need new start visit per Dr. Jeannetta Sherry Pennant, PharmD, MPH, BCPS, CPP Clinical Pharmacist (Rheumatology and Pulmonology)

## 2023-12-20 NOTE — Progress Notes (Signed)
 Pharmacy Note  Subjective:  Patient presents today to [facility] for follow up office visit.   Patient was seen by the pharmacist for counseling on Cosentyx for psoriatic arthritis and plaque psoriasis. Her last dose of Tremfya was on 10/10/2023  History of inflammatory bowel disease: No. Her cousin and second cousin have IBD  Objective:  CBC    Component Value Date/Time   WBC 10.2 01/18/2023 1553   WBC 7.6 04/09/2022 0836   WBC 6.7 02/06/2021 0901   RBC 5.09 01/18/2023 1553   RBC 4.78 04/09/2022 0836   HGB 14.6 01/18/2023 1553   HGB 13.2 10/03/2015 0920   HGB 12.7 11/19/2014 1255   HCT 43.2 01/18/2023 1553   HCT 38.5 11/19/2014 1255   PLT 338 01/18/2023 1553   MCV 85 01/18/2023 1553   MCV 83.7 11/19/2014 1255   MCH 28.7 01/18/2023 1553   MCH 28.9 04/09/2022 0836   MCHC 33.8 01/18/2023 1553   MCHC 33.3 04/09/2022 0836   RDW 11.8 01/18/2023 1553   RDW 13.0 11/19/2014 1255   LYMPHSABS 1.7 04/09/2022 0836   LYMPHSABS 1.7 11/19/2014 1255   MONOABS 0.7 04/09/2022 0836   MONOABS 0.6 11/19/2014 1255   EOSABS 0.2 04/09/2022 0836   EOSABS 0.1 11/19/2014 1255   BASOSABS 0.1 04/09/2022 0836   BASOSABS 0.0 11/19/2014 1255    CMP     Component Value Date/Time   NA 136 01/18/2023 1553   NA 140 11/19/2014 1255   K 4.3 01/18/2023 1553   K 3.8 11/19/2014 1255   CL 99 01/18/2023 1553   CO2 21 01/18/2023 1553   CO2 24 11/19/2014 1255   GLUCOSE 90 01/18/2023 1553   GLUCOSE 121 (H) 07/14/2020 0555   GLUCOSE 108 11/19/2014 1255   BUN 9 01/18/2023 1553   BUN 7.7 11/19/2014 1255   CREATININE 0.74 01/18/2023 1553   CREATININE 0.61 01/01/2017 1609   CREATININE 0.7 11/19/2014 1255   CALCIUM  10.1 02/03/2023 1607   CALCIUM  8.9 11/19/2014 1255   PROT 6.9 08/13/2023 0752   PROT 7.1 01/18/2023 1553   PROT 6.9 11/19/2014 1255   ALBUMIN 3.9 08/13/2023 0752   ALBUMIN 4.2 01/18/2023 1553   ALBUMIN 3.7 11/19/2014 1255   AST 14 08/13/2023 0752   AST 20 11/19/2014 1255   ALT  17 08/13/2023 0752   ALT 18 11/19/2014 1255   ALKPHOS 62 08/13/2023 0752   ALKPHOS 58 11/19/2014 1255   BILITOT 0.7 08/13/2023 0752   BILITOT 0.7 01/18/2023 1553   BILITOT 0.43 11/19/2014 1255   GFRNONAA >60 07/14/2020 0555   GFRAA >60 07/14/2020 0555    Baseline Immunosuppressant Therapy Labs TB GOLD   Hepatitis Panel   HIV No results found for: HIV Immunoglobulins   SPEP    Latest Ref Rng & Units 08/13/2023    7:52 AM  Serum Protein Electrophoresis  Total Protein 6.0 - 8.3 g/dL 6.9    H3EI No results found for: G6PDH TPMT No results found for: TPMT   Chest x-ray: 07/14/2020 - Normal chest radiographs.  Assessment/Plan:  Counseled patient that Cosentyx is a IL-17 inhibitor.  Counseled patient on purpose, proper use, and adverse effects of Cosentyx. Reviewed the most common adverse effects of infection (more commonly nasopharyngitis, URTI), inflammatory bowel disease, and allergic reaction.  Counseled patient that Cosentyx should be held prior to scheduled surgery.  Counseled patient to avoid live vaccines while on Cosentyx.  Recommend annual influenza, PCV 15 or PCV20 or Pneumovax 23, and Shingrix  as indicated.  Reviewed the importance of regular labs while on Cosentyx.  Will monitor CBC and CMP 1 month after starting and every 3 months routinely thereafter. Will monitor TB gold annually.  Standing orders placed.  Provided patient with medication education material and answered all questions.  Patient consented to Cosentyx.  Will upload consent into patient's chart.  Will apply for Cosentyx through patient's insurance.  Reviewed storage information for Cosentyx.  Advised initial injection must be administered in office.  Patient voiced understanding.    Patient dose will be for plaque psoriasis +/- psoriatic arthritis 300 mg every 7 days for 5 weeks then 300 mg every 28 days.  Prescription will be sent to pharmacy pending lab results and insurance approval.   Discussed  possibility of insurance preferring Taltz  over Cosentyx. Reviewed injection demo devices for both Cosentyx Unoready and Taltz .  Sherry Pennant, PharmD, MPH, BCPS, CPP Clinical Pharmacist (Rheumatology and Pulmonology)

## 2023-12-20 NOTE — Telephone Encounter (Signed)
 Submitted a Prior Authorization request to [employer] for COSENTYX SQ via CoverMyMeds. Will update once we receive a response.  Key: [id number]   Please visit [email or web address] to start a prior authorization or fax information to [phone]. Please include all supporting chart notes. You may contact [employer] at [phone].  Submitted PA via PromptPA portal EOC ID: [id number]  PromptPa portal glitched during submission. May not have processed. Will try again later
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
## 2023-12-24 ENCOUNTER — Telehealth: Payer: Self-pay | Admitting: Pharmacist

## 2023-12-24 DIAGNOSIS — L409 Psoriasis, unspecified: Secondary | ICD-10-CM

## 2023-12-24 NOTE — Telephone Encounter (Signed)
Cosentyx denied. Submitted an URGENT Prior Authorization request to [employer] for TALTZ via PromptPA portal. Will update once we receive a response.  EOC: [id number]

## 2023-12-24 NOTE — Telephone Encounter (Signed)
Received a fax regarding Prior Authorization from [employer] for COSENTYX SQ. Authorization has been DENIED because patient must meet all of the following: 1) failed, intolerance, or CI to two of the following: Cimzia, Enbrel, adalimumab, Simponi, Stelara, Skyrizi, [place], Rinvoq, Xeljanz 2) failed, intolerance, or CI to [employer]. 3) failed, intolerance, or CI to [place]  Will start BIV for Erlene Quan, PharmD, MPH, BCPS, CPP Clinical Pharmacist (Rheumatology and Pulmonology)

## 2023-12-28 ENCOUNTER — Other Ambulatory Visit (HOSPITAL_COMMUNITY): Payer: Self-pay

## 2023-12-28 ENCOUNTER — Encounter: Payer: Self-pay | Admitting: Oncology

## 2023-12-28 NOTE — Telephone Encounter (Signed)
Received notification from [facility] regarding a prior authorization for TALTZ. Authorization has been APPROVED from 12/24/2023 to 06/22/2024. Approval letter sent to scan center.  Unable to run test claim because patient must fill through  a [facility]    [facility] # [id number] Phone # [phone]  Email sent to above regarding escribing directions. Left VM for patient to help enroll into copay card and determine if she has details on what pharmacy must fill .  Chesley Mires, PharmD, MPH, BCPS, CPP Clinical Pharmacist (Rheumatology and Pulmonology)

## 2023-12-31 NOTE — Telephone Encounter (Signed)
Patient left message returning your call, patient stated she is not associated with [facility] and requesting a return call regarding filling her Land. Thank you.

## 2024-01-03 NOTE — Telephone Encounter (Signed)
Received email stating patient must fill with [facility]. Tresa Endo requested pt name and DOB.  Left VM for patient.  Chesley Mires, PharmD, MPH, BCPS, CPP Clinical Pharmacist (Rheumatology and Pulmonology)

## 2024-01-06 NOTE — Telephone Encounter (Signed)
Another email sent to clarify appropriate receiving pharmacy for patient's Altamease Oiler

## 2024-01-07 MED ORDER — TALTZ 80 MG/ML ~~LOC~~ SOAJ: SUBCUTANEOUS | 0 refills | Status: DC

## 2024-01-07 MED ORDER — TALTZ 80 MG/ML ~~LOC~~ SOAJ: SUBCUTANEOUS | 1 refills | Status: DC

## 2024-01-07 NOTE — Telephone Encounter (Signed)
Received email from [facility] at the same time that I was calling patient. Patient is NOT part of [facility]. Patient can fill with [facility] pharmacy  Spoke with patient - rx sent to [facility]: [phone]   Dose: 160mg  at Week 0, then 80mg  SQ at [facility] 2, 4, 6, 8, 10, 12 then every 4 weeks thereafter  Enrolled patient into Taltz copay card while on phone: RXBIN: [id number] PCN: [id number] GRP: [id number] ID: [id number] Expiration Date: 12/06/2026  MyChart message sent to patient w pertinent information  Chesley Mires, PharmD, MPH, BCPS, CPP Clinical Pharmacist (Rheumatology and Pulmonology)

## 2024-01-07 NOTE — Addendum Note (Signed)
Addended by: Murrell Redden on: 01/07/2024 12:25 PM   Modules accepted: Orders

## 2024-01-11 ENCOUNTER — Other Ambulatory Visit: Payer: Self-pay | Admitting: Family

## 2024-01-11 DIAGNOSIS — G479 Sleep disorder, unspecified: Secondary | ICD-10-CM

## 2024-01-11 NOTE — Telephone Encounter (Signed)
Please make sure pt has f/u in the next one month  Was supposed to be seen end of dec for medication follow up

## 2024-01-11 NOTE — Telephone Encounter (Signed)
 Lvmtcb. Sent mychart message

## 2024-01-13 ENCOUNTER — Telehealth: Payer: Self-pay | Admitting: *Deleted

## 2024-01-13 NOTE — Telephone Encounter (Signed)
 Patient called, [facility] states they have not received RX for Taltz , I called [facility] who will not fill patient's Taltz  because they said patient's insurance is not in network with [facility] and Taltz  has be filled at [facility] in [place]. Please advise.

## 2024-01-18 NOTE — Telephone Encounter (Signed)
I have emailed previous [employer] contacts to confirm

## 2024-01-19 ENCOUNTER — Ambulatory Visit: Payer: Managed Care, Other (non HMO) | Admitting: Family

## 2024-01-19 ENCOUNTER — Encounter: Payer: Self-pay | Admitting: Family

## 2024-01-19 ENCOUNTER — Telehealth: Payer: Self-pay | Admitting: Family

## 2024-01-19 VITALS — BP 124/88 | HR 82 | Temp 98.2°F | Ht 70.75 in | Wt 281.6 lb

## 2024-01-19 DIAGNOSIS — M542 Cervicalgia: Secondary | ICD-10-CM | POA: Diagnosis not present

## 2024-01-19 DIAGNOSIS — F4323 Adjustment disorder with mixed anxiety and depressed mood: Secondary | ICD-10-CM

## 2024-01-19 DIAGNOSIS — G8929 Other chronic pain: Secondary | ICD-10-CM

## 2024-01-19 MED ORDER — DULOXETINE HCL 30 MG PO CPEP: 30.0000 mg | ORAL_CAPSULE | Freq: Every day | ORAL | 0 refills | Status: DC

## 2024-01-19 MED ORDER — SERTRALINE HCL 100 MG PO TABS: 100.0000 mg | ORAL_TABLET | Freq: Every day | ORAL | 0 refills | Status: DC

## 2024-01-19 NOTE — Telephone Encounter (Signed)
Received a call from Amy with [employer] stating they have placed an override for patient to be able to fill with [facility]. Amy states patient will need new Rx sent to [facility] so they can fill the Taltz for her.

## 2024-01-19 NOTE — Patient Instructions (Signed)
Sandy [name]

## 2024-01-19 NOTE — Telephone Encounter (Signed)
Sounds good

## 2024-01-19 NOTE — Telephone Encounter (Signed)
Pt states that she established with you back in December, and you had recommended referral for another psychologist. She states she has not heard anything back in terms of setting up with anyone else. She said 'it was very hard to even make that appointment with karen and it is hard to motivate herself to see anyone else'.   Could I ask what kind of direction you were considering for her so I can get her set up with another?

## 2024-01-19 NOTE — Assessment & Plan Note (Signed)
Stop sertraline  Start duloxetine 30 mg  Will assess referral for therapy as well, sent message to karen sharpe asking for direction of her recommendations.

## 2024-01-19 NOTE — Telephone Encounter (Signed)
Called [facility] regarding Altamease Oiler. Spoke with pharmacist who will reactivate all prescriptions for Taltz. They did a test claim to process successfully.  Called patient and advised her to [place] pharmacy tomorrow. IF any further issues, she is to call clinic asap so we can troubleshoot  Chesley Mires, PharmD, MPH, BCPS, CPP Clinical Pharmacist (Rheumatology and Pulmonology)
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
## 2024-01-19 NOTE — Assessment & Plan Note (Signed)
Recommended physical therapy but pt declines.  Xray from 6/24 reviewed.  Continue flexeril prn and continue celebrex  Trial cymbalta 30 mg once daily  Stopping sertraline.

## 2024-01-23 NOTE — Telephone Encounter (Signed)
 erroneous

## 2024-01-28 NOTE — Progress Notes (Signed)
 Pharmacy Note  Subjective:   Patient presents to clinic today to receive first dose of Taltz for PsA/PsO. Her last dose of Tremfya was on 10/10/2023. Reports that her PsO is flaring at this time (notes patches on palms of hand and legs)  Patient running a fever or have signs/symptoms of infection? No  Patient currently on antibiotics for the treatment of infection? No  Patient have any upcoming invasive procedures/surgeries? No  Objective: CMP     Component Value Date/Time   NA 136 01/18/2023 1553   NA 140 11/19/2014 1255   K 4.3 01/18/2023 1553   K 3.8 11/19/2014 1255   CL 99 01/18/2023 1553   CO2 21 01/18/2023 1553   CO2 24 11/19/2014 1255   GLUCOSE 90 01/18/2023 1553   GLUCOSE 121 (H) 07/14/2020 0555   GLUCOSE 108 11/19/2014 1255   BUN 9 01/18/2023 1553   BUN 7.7 11/19/2014 1255   CREATININE 0.74 01/18/2023 1553   CREATININE 0.61 01/01/2017 1609   CREATININE 0.7 11/19/2014 1255   CALCIUM 10.1 02/03/2023 1607   CALCIUM 8.9 11/19/2014 1255   PROT 6.9 08/13/2023 0752   PROT 7.1 01/18/2023 1553   PROT 6.9 11/19/2014 1255   ALBUMIN 3.9 08/13/2023 0752   ALBUMIN 4.2 01/18/2023 1553   ALBUMIN 3.7 11/19/2014 1255   AST 14 08/13/2023 0752   AST 20 11/19/2014 1255   ALT 17 08/13/2023 0752   ALT 18 11/19/2014 1255   ALKPHOS 62 08/13/2023 0752   ALKPHOS 58 11/19/2014 1255   BILITOT 0.7 08/13/2023 0752   BILITOT 0.7 01/18/2023 1553   BILITOT 0.43 11/19/2014 1255   GFRNONAA >60 07/14/2020 0555   GFRAA >60 07/14/2020 0555    CBC    Component Value Date/Time   WBC 10.2 01/18/2023 1553   WBC 7.6 04/09/2022 0836   WBC 6.7 02/06/2021 0901   RBC 5.09 01/18/2023 1553   RBC 4.78 04/09/2022 0836   HGB 14.6 01/18/2023 1553   HGB 13.2 10/03/2015 0920   HGB 12.7 11/19/2014 1255   HCT 43.2 01/18/2023 1553   HCT 38.5 11/19/2014 1255   PLT 338 01/18/2023 1553   MCV 85 01/18/2023 1553   MCV 83.7 11/19/2014 1255   MCH 28.7 01/18/2023 1553   MCH 28.9 04/09/2022 0836   MCHC 33.8  01/18/2023 1553   MCHC 33.3 04/09/2022 0836   RDW 11.8 01/18/2023 1553   RDW 13.0 11/19/2014 1255   LYMPHSABS 1.7 04/09/2022 0836   LYMPHSABS 1.7 11/19/2014 1255   MONOABS 0.7 04/09/2022 0836   MONOABS 0.6 11/19/2014 1255   EOSABS 0.2 04/09/2022 0836   EOSABS 0.1 11/19/2014 1255   BASOSABS 0.1 04/09/2022 0836   BASOSABS 0.0 11/19/2014 1255    Baseline Immunosuppressant Therapy Labs TB GOLD   Hepatitis Panel   HIV No results found for: "HIV" Immunoglobulins   SPEP    Latest Ref Rng & Units 08/13/2023    7:52 AM  Serum Protein Electrophoresis  Total Protein 6.0 - 8.3 g/dL 6.9    Chest x-ray: 07/08/9561 - Normal chest radiographs.  Assessment/Plan:  Reviewed importance of holding Taltz with signs/symptoms of an infections, if antibiotics are prescribed to treat an active infection, and with invasive procedures  Demonstrated proper injection technique with Taltz demo device  Patient able to demonstrate proper injection technique using the teach back method.  Patient self injected in the right upper thigh and left upper thigh with:  Patient-supplied Medication: Taltz 80mg /mL pen injector x 2 pens NDC: [address]-69 Lot: [id number] CA Expiration:  06/06/2025  Patient tolerated well.  Observed for 30 mins in office for adverse reaction. Patient denies itchiness and irritation at injection.,and Reviewed injection site reaction management with patient verbally and printed information for review in AVS   Slight swelling and localized redness noted however patient denied any bothersome symptoms. Reviewed injection site reaction management in detail  Patient is to return in 1 month for labs and 6-8 weeks for follow-up appointment.  Standing orders for CBC/CMP placed.  TB gold will be monitored yearly.   Taltz approved through insurance .   Rx sent to: [facility]: [phone]  (already sent rx's on 01/07/2024).  Patient has received shipment at home already  Patient will  continue Taltz 160mg  subcut at Week 0 (administered in clinic today), then 80mg  subcut at [facility] 2, 4, 6, 8, 10, 12 then every 4 weeks thereafter.  All questions encouraged and answered.  Instructed patient to call with any further questions or concerns.  Chesley Mires, PharmD, MPH, BCPS, CPP Clinical Pharmacist (Rheumatology and Pulmonology)  01/28/2024 4:16 PM

## 2024-01-31 ENCOUNTER — Ambulatory Visit: Payer: Managed Care, Other (non HMO) | Attending: Internal Medicine | Admitting: Pharmacist

## 2024-01-31 DIAGNOSIS — L409 Psoriasis, unspecified: Secondary | ICD-10-CM

## 2024-01-31 DIAGNOSIS — M1990 Unspecified osteoarthritis, unspecified site: Secondary | ICD-10-CM

## 2024-01-31 DIAGNOSIS — Z7189 Other specified counseling: Secondary | ICD-10-CM

## 2024-01-31 NOTE — Patient Instructions (Addendum)
 Your next TALTZ dose is due on 3/10, 3/24, 4/7, 4/21, 5/5, 5/19 then every 4 weeks thereafter (startong in 05/22/24)  HOLD TALTZ if you have signs or symptoms of an infection. You can resume once you feel better or back to your baseline. HOLD TALTZ if you start antibiotics to treat an infection. HOLD TALTZ around the time of surgery/procedures. Your surgeon will be able to provide recommendations on when to hold BEFORE and when you are cleared to RESUME.  Pharmacy information: Your prescription will be shipped from [facility]. Their phone number is [phone] Please call to schedule shipment and confirm address. They will mail your medication to your home.  Labs are due in 1 month then every 3 months. Lab hours are from Monday to Thursday 8am-12:30pm and 1pm-5pm and Friday 8am-12pm. You do not need an appointment if you come for labs during these times. If you'd like to go to a [facility] or [facility] closer to home, please call our clinic 48 hours prior to lab date so we can release orders in a timely manner.  Stay up to date on all routine vaccines: influenza, pneumonia, COVID19, Shingles  How to manage an injection site reaction: Remember the 5 C's: COUNTER - leave on the counter at least 30 minutes but up to overnight to bring medication to room temperature. This may help prevent stinging COLD - place something cold (like an ice gel pack or cold water bottle) on the injection site just before cleansing with alcohol. This may help reduce pain CLARITIN - use Claritin (generic name is loratadine) for the first two weeks of treatment or the day of, the day before, and the day after injecting. This will help to minimize injection site reactions CORTISONE CREAM - apply if injection site is irritated and itching CALL ME - if injection site reaction is bigger than the size of your fist, looks infected, blisters, or if you develop hives
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
## 2024-02-09 ENCOUNTER — Ambulatory Visit: Payer: Managed Care, Other (non HMO) | Admitting: Family Medicine

## 2024-02-09 ENCOUNTER — Encounter: Payer: Self-pay | Admitting: Oncology

## 2024-02-09 ENCOUNTER — Encounter: Payer: Self-pay | Admitting: Family Medicine

## 2024-02-09 VITALS — BP 90/70 | HR 83 | Temp 97.6°F | Ht 70.75 in | Wt 281.1 lb

## 2024-02-09 DIAGNOSIS — M542 Cervicalgia: Secondary | ICD-10-CM | POA: Diagnosis not present

## 2024-02-09 DIAGNOSIS — M62838 Other muscle spasm: Secondary | ICD-10-CM

## 2024-02-09 MED ORDER — GABAPENTIN 300 MG PO CAPS
300.0000 mg | ORAL_CAPSULE | Freq: Three times a day (TID) | ORAL | 3 refills | Status: DC
Start: 2024-02-09 — End: 2024-02-16

## 2024-02-09 MED ORDER — PREDNISONE 20 MG PO TABS: ORAL_TABLET | ORAL | 0 refills | Status: DC

## 2024-02-09 NOTE — Patient Instructions (Addendum)
 Generic Gabapentin Titration Schedule  Generic Gabapentin (generic form of Neurontin) comes in 300 mg tablets or capsules.   You have to titrate your dose slowly to reduce side effects and reduce sedation / sleepiness.    Week               Breakfast  Lunch   Dinner One                 0   0   300 mg Two   300mg    0   300mg  Three   300mg    300mg    300mg  Four   300mg    300mg    600mg  (2 tabs) Five   600mg  (2 tabs) 300mg    600mg  (2 tabs) Six   600mg  (2 tabs) 600mg  (2 tabs) 600mg  (2 tabs) Seven   600mg  (2 tabs) 600mg  (2 tabs) 900mg  (3 tabs) Eight   900mg  (3 tabs) 600mg  (2 tabs) 900mg  (3 tabs)  If you have any problems at any time, drop back to the previous dosing schedule. Continue with this dose for 1 week, and then try to go up the next step again.     Voltaren 1% gel, over the counter You can apply up to 4 times a day  This can be applied to any joint: knee, wrist, fingers, elbows, shoulders, feet and ankles. Can apply to any tendon: tennis elbow, achilles, tendon, rotator cuff or any other tendon.  Minimal is absorbed in the bloodstream: ok with oral anti-inflammatory or a blood thinner.  Cost is about 9 dollars

## 2024-02-10 ENCOUNTER — Other Ambulatory Visit: Payer: Self-pay | Admitting: Family

## 2024-02-10 DIAGNOSIS — G479 Sleep disorder, unspecified: Secondary | ICD-10-CM

## 2024-02-14 ENCOUNTER — Other Ambulatory Visit: Payer: Self-pay | Admitting: Internal Medicine

## 2024-02-14 DIAGNOSIS — M1990 Unspecified osteoarthritis, unspecified site: Secondary | ICD-10-CM

## 2024-02-14 NOTE — Telephone Encounter (Signed)
 Last Fill: 12/20/2023  Labs: 01/18/2023 Calcium 10.4,   Next Visit: 03/27/2024  Last Visit: 12/20/2023  DX: Psoriatic Arthritis   Current Dose per office note 1/13/205: Celebrex 100 mg PRN  Take 1 capsule by mouth as needed up to every 12 hours as needed for joint pain   Okay to refill Celebrex?

## 2024-02-16 ENCOUNTER — Encounter: Payer: Self-pay | Admitting: Family

## 2024-02-16 ENCOUNTER — Ambulatory Visit (INDEPENDENT_AMBULATORY_CARE_PROVIDER_SITE_OTHER): Payer: Managed Care, Other (non HMO) | Admitting: Family

## 2024-02-16 VITALS — BP 122/100 | HR 87 | Temp 98.0°F | Ht 70.75 in | Wt 282.8 lb

## 2024-02-16 DIAGNOSIS — R7303 Prediabetes: Secondary | ICD-10-CM | POA: Diagnosis not present

## 2024-02-16 DIAGNOSIS — Z1211 Encounter for screening for malignant neoplasm of colon: Secondary | ICD-10-CM

## 2024-02-16 DIAGNOSIS — Z0001 Encounter for general adult medical examination with abnormal findings: Secondary | ICD-10-CM | POA: Diagnosis not present

## 2024-02-16 DIAGNOSIS — G4719 Other hypersomnia: Secondary | ICD-10-CM

## 2024-02-16 DIAGNOSIS — E782 Mixed hyperlipidemia: Secondary | ICD-10-CM | POA: Diagnosis not present

## 2024-02-16 DIAGNOSIS — D500 Iron deficiency anemia secondary to blood loss (chronic): Secondary | ICD-10-CM

## 2024-02-16 DIAGNOSIS — M542 Cervicalgia: Secondary | ICD-10-CM

## 2024-02-16 DIAGNOSIS — Z0000 Encounter for general adult medical examination without abnormal findings: Secondary | ICD-10-CM

## 2024-02-16 DIAGNOSIS — F4323 Adjustment disorder with mixed anxiety and depressed mood: Secondary | ICD-10-CM

## 2024-02-16 DIAGNOSIS — Z23 Encounter for immunization: Secondary | ICD-10-CM | POA: Diagnosis not present

## 2024-02-16 DIAGNOSIS — E559 Vitamin D deficiency, unspecified: Secondary | ICD-10-CM

## 2024-02-16 DIAGNOSIS — R0683 Snoring: Secondary | ICD-10-CM

## 2024-02-16 DIAGNOSIS — R030 Elevated blood-pressure reading, without diagnosis of hypertension: Secondary | ICD-10-CM | POA: Diagnosis not present

## 2024-02-16 DIAGNOSIS — G8929 Other chronic pain: Secondary | ICD-10-CM

## 2024-02-16 LAB — CBC
HCT: 45.2 % (ref 36.0–46.0)
Hemoglobin: 14.6 g/dL (ref 12.0–15.0)
MCHC: 32.3 g/dL (ref 30.0–36.0)
MCV: 86.8 fl (ref 78.0–100.0)
Platelets: 368 10*3/uL (ref 150.0–400.0)
RBC: 5.21 Mil/uL — ABNORMAL HIGH (ref 3.87–5.11)
RDW: 13.1 % (ref 11.5–15.5)
WBC: 13.2 10*3/uL — ABNORMAL HIGH (ref 4.0–10.5)

## 2024-02-16 LAB — LIPID PANEL
Cholesterol: 182 mg/dL (ref 0–200)
HDL: 54.5 mg/dL (ref >39.00)
LDL Cholesterol: 115 mg/dL — ABNORMAL HIGH (ref 0–99)
NonHDL: 127.82
Total CHOL/HDL Ratio: 3
Triglycerides: 66 mg/dL (ref 0.0–149.0)
VLDL: 13.2 mg/dL (ref 0.0–40.0)

## 2024-02-16 LAB — HEMOGLOBIN A1C: Hgb A1c MFr Bld: 6.4 % (ref 4.6–6.5)

## 2024-02-16 LAB — IBC + FERRITIN
Ferritin: 35.2 ng/mL (ref 10.0–291.0)
Iron: 136 ug/dL (ref 42–145)
Saturation Ratios: 36.7 % (ref 20.0–50.0)
TIBC: 371 ug/dL (ref 250.0–450.0)
Transferrin: 265 mg/dL (ref 212.0–360.0)

## 2024-02-16 LAB — VITAMIN D 25 HYDROXY (VIT D DEFICIENCY, FRACTURES): VITD: 18.74 ng/mL — ABNORMAL LOW (ref 30.00–100.00)

## 2024-02-16 MED ORDER — GABAPENTIN 100 MG PO CAPS: 100.0000 mg | ORAL_CAPSULE | Freq: Three times a day (TID) | ORAL | 0 refills | Status: DC

## 2024-02-16 NOTE — Patient Instructions (Addendum)
  A referral was placed today for a sleep study  Please let us know if you have not heard back within 2 weeks about the referral.  A referral was placed today for Gi to set up your colonoscopy.  Please let us know if you have not heard back within 1 week about your referral.

## 2024-02-16 NOTE — Assessment & Plan Note (Signed)
 Pt advised of the following:  Continue medication as prescribed. Monitor blood pressure periodically and/or when you feel symptomatic. Goal is <130/90 on average. Ensure that you have rested for 30 minutes prior to checking your blood pressure. Record your readings and bring them to your next visit if necessary.work on a low sodium diet.

## 2024-02-16 NOTE — Assessment & Plan Note (Signed)
 Some improvement with cymbalta.  Pt still working on getting set up with therapy

## 2024-02-16 NOTE — Progress Notes (Signed)
 Subjective:  Patient ID: Sandy [name], female    DOB: [birth year]  Age: 53 y.o. MRN: [id number]  Patient Care Team: Mort Sawyers, FNP as PCP - General (Family Medicine) Jerene Bears, MD as Consulting Physician (Obstetrics and Gynecology)   CC: No chief complaint on file.   HPI Sandy [name] is a 53 y.o. female who presents today for an annual physical exam. She reports consuming a general diet. The patient does not participate in regular exercise at present. She generally feels well. She reports sleeping fairly well. She does have additional problems to discuss today.   Vision:Within last year Dental:Receives regular dental care  Mammogram: 11/26/23  Last pap: Dec 24, 2021 Colonoscopy: age 5  Pt is without acute concerns.   Elevated blood pressure in office today, has been running more on the lower end than usually higher. Today in office a little elevated.   Anxiety/depression: has been doing better on the cymbalta.   Fatigue over the last week, feels like she needs to nap or so. She isn't feeling sick just tired but she also did start gabapentin 300 mg at night time one week ago. There have been a lot of viruses going around at work. She does have allergic rhinitis symptoms, pnd with coughing in am but this is typical for her. She does take otc zyrtec.   Advanced Directives Patient does not have advanced directives    DEPRESSION SCREENING    02/16/2024    9:40 AM 01/19/2024    7:49 AM 11/01/2023   11:53 AM 05/12/2023   11:13 AM 01/18/2023    3:09 PM 01/18/2023    3:07 PM 02/27/2022   10:34 AM  PHQ 2/9 Scores  PHQ - 2 Score 4 3 6 1  0 0 0  PHQ- 9 Score 13 9 19          ROS: Negative unless specifically indicated above in HPI.    Current Outpatient Medications:    albuterol (VENTOLIN HFA) 108 (90 Base) MCG/ACT inhaler, TAKE 2 PUFFS BY MOUTH EVERY 4 TO 6 HOURS AS NEEDED, Disp: , Rfl:    atorvastatin (LIPITOR) 10 MG tablet, Take 1 tablet (10 mg total) by mouth daily.,  Disp: 90 tablet, Rfl: 3   celecoxib (CELEBREX) 100 MG capsule, TAKE 1 CAPSULE BY MOUTH AS NEEDED UP TO EVERY 12 HOURS AS NEEDED FOR JOINT PAIN, Disp: 60 capsule, Rfl: 2   Cholecalciferol (VITAMIN D) 50 MCG (2000 UT) CAPS, Take by mouth., Disp: , Rfl:    cyclobenzaprine (FLEXERIL) 10 MG tablet, Take 1/2 to one tablet po qhs prn muscle spasm, Disp: 30 tablet, Rfl: 0   DULoxetine (CYMBALTA) 30 MG capsule, Take 1 capsule (30 mg total) by mouth daily., Disp: 90 capsule, Rfl: 0   gabapentin (NEURONTIN) 100 MG capsule, Take 1 capsule (100 mg total) by mouth 3 (three) times daily., Disp: 30 capsule, Rfl: 0   hydrOXYzine (VISTARIL) 25 MG capsule, Take 1 capsule (25 mg total) by mouth every 8 (eight) hours as needed., Disp: 30 capsule, Rfl: 0   ixekizumab (TALTZ) 80 MG/ML pen, Inject 160mg  into the skin at Week 0, then 80mg  at Week 2, Disp: 3 mL, Rfl: 0   ixekizumab (TALTZ) 80 MG/ML pen, Inject 80mg  into the skin at Week 4, 6, 8, 10, Disp: 2 mL, Rfl: 1   ixekizumab (TALTZ) 80 MG/ML pen, Inject 80mg  into the skin at Week 12 and every 4 weeks thereafter, Disp: 1 mL, Rfl: 0   metroNIDAZOLE (METROCREAM)  0.75 % cream, Apply topically 2 (two) times daily., Disp: , Rfl:    norethindrone (AYGESTIN) 5 MG tablet, Take 0.5 tablets (2.5 mg total) by mouth daily., Disp: 90 tablet, Rfl: 3   predniSONE (DELTASONE) 20 MG tablet, 2 tabs po daily for 5 days, then 1 tab po daily for 5 days, Disp: 15 tablet, Rfl: 0   rizatriptan (MAXALT-MLT) 5 MG disintegrating tablet, Take 1 tablet (5 mg total) by mouth as needed for migraine. May repeat in 2 hours if needed, Disp: 10 tablet, Rfl: 11   SUMAtriptan (IMITREX) 100 MG tablet, Take 1 tablet (100 mg total) by mouth every 2 (two) hours as needed for migraine. May repeat in 2 hours if headache persists or recurs., Disp: 9 tablet, Rfl: 1   traZODone (DESYREL) 50 MG tablet, TAKE 1 TABLET (50 MG TOTAL) BY MOUTH AT BEDTIME AS NEEDED. FOR SLEEP, Disp: 90 tablet, Rfl: 3   valACYclovir  (VALTREX) 500 MG tablet, Take 1 tablet (500 mg total) by mouth daily. Increase to bid x 3 days with any symptoms., Disp: 30 tablet, Rfl: 12   ZORYVE 0.3 % CREA, Apply topically., Disp: , Rfl:     Objective:    BP (!) 122/100   Pulse 87   Temp 98 F (36.7 C) (Temporal)   Ht 5' 10.75" (1.797 m)   Wt 282 lb 12.8 oz (128.3 kg)   SpO2 98%   BMI 39.72 kg/m   BP Readings from Last 3 Encounters:  02/16/24 (!) 122/100  02/09/24 90/70  01/19/24 124/88      Physical Exam Constitutional:      General: She is not in acute distress.    Appearance: Normal appearance. She is obese. She is not ill-appearing.  HENT:     Head: Normocephalic.     Right Ear: Tympanic membrane normal.     Left Ear: Tympanic membrane normal.     Nose: Nose normal.     Mouth/Throat:     Mouth: Mucous membranes are moist.  Eyes:     Extraocular Movements: Extraocular movements intact.     Pupils: Pupils are equal, round, and reactive to light.  Cardiovascular:     Rate and Rhythm: Normal rate and regular rhythm.  Pulmonary:     Effort: Pulmonary effort is normal.     Breath sounds: Normal breath sounds.  Abdominal:     General: Abdomen is flat. Bowel sounds are normal.     Palpations: Abdomen is soft.     Tenderness: There is no guarding or rebound.  Musculoskeletal:        General: Normal range of motion.     Cervical back: Normal range of motion.  Skin:    General: Skin is warm.     Capillary Refill: Capillary refill takes less than 2 seconds.  Neurological:     General: No focal deficit present.     Mental Status: She is alert.  Psychiatric:        Mood and Affect: Mood normal.        Behavior: Behavior normal.        Thought Content: Thought content normal.        Judgment: Judgment normal.          Assessment & Plan:  Chronic neck pain Assessment & Plan: Doing better with gabapentin however causing fatigue  Will decrease to 100 mg nightly and titrate up from there as tolerating    Orders: -     Gabapentin; Take 1  capsule (100 mg total) by mouth 3 (three) times daily.  Dispense: 30 capsule; Refill: 0  Screening for colon cancer -     Ambulatory referral to Gastroenterology  Mixed hyperlipidemia Assessment & Plan: Continue atorvastatin 10 mg once nightly  Ordered lipid panel, pending results. Work on low cholesterol diet and exercise as tolerated    Orders: -     Lipid panel  Vitamin D deficiency -     VITAMIN D 25 Hydroxy (Vit-D Deficiency, Fractures); Future  Prediabetes -     Hemoglobin A1c  Iron deficiency anemia due to chronic blood loss Assessment & Plan: Ordering cbc ibc ferritin    Adjustment reaction with anxiety and depression Assessment & Plan: Some improvement with cymbalta.  Pt still working on getting set up with therapy   Orders: -     IBC + Ferritin; Future -     CBC  Excessive daytime sleepiness -     Ambulatory referral to Sleep Studies  Snoring -     Ambulatory referral to Sleep Studies  Need for shingles vaccine -     Varicella-zoster vaccine IM  Encounter for general adult medical examination without abnormal findings Assessment & Plan: Patient Counseling(The following topics were reviewed):  Preventative care handout given to pt  Health maintenance and immunizations reviewed. Please refer to Health maintenance section. Pt advised on safe sex, wearing seatbelts in car, and proper nutrition labwork ordered today for annual Dental health: Discussed importance of regular tooth brushing, flossing, and dental visits.    Elevated blood pressure reading in office without diagnosis of hypertension Assessment & Plan: Pt advised of the following:  Continue medication as prescribed. Monitor blood pressure periodically and/or when you feel symptomatic. Goal is <130/90 on average. Ensure that you have rested for 30 minutes prior to checking your blood pressure. Record your readings and bring them to your next visit if  necessary.work on a low sodium diet.        Follow-up: Return in about 6 months (around 08/18/2024) for f/u cholesterol.   Mort Sawyers, FNP

## 2024-02-16 NOTE — Assessment & Plan Note (Signed)
Ordering cbc ibc ferritin

## 2024-02-16 NOTE — Assessment & Plan Note (Signed)

## 2024-02-16 NOTE — Assessment & Plan Note (Addendum)
Continue atorvastatin 10 mg once nightly Ordered lipid panel, pending results. Work on low cholesterol diet and exercise as tolerated

## 2024-02-16 NOTE — Assessment & Plan Note (Addendum)
 Doing better with gabapentin however causing fatigue  Will decrease to 100 mg nightly and titrate up from there as tolerating

## 2024-02-17 ENCOUNTER — Encounter: Payer: Self-pay | Admitting: Family

## 2024-02-17 DIAGNOSIS — D72829 Elevated white blood cell count, unspecified: Secondary | ICD-10-CM

## 2024-02-18 ENCOUNTER — Ambulatory Visit: Payer: Managed Care, Other (non HMO) | Admitting: Internal Medicine

## 2024-02-21 ENCOUNTER — Encounter: Payer: Self-pay | Admitting: Family

## 2024-02-21 DIAGNOSIS — E559 Vitamin D deficiency, unspecified: Secondary | ICD-10-CM

## 2024-02-21 MED ORDER — CHOLECALCIFEROL 1.25 MG (50000 UT) PO TABS: 1.0000 | ORAL_TABLET | ORAL | 0 refills | Status: DC

## 2024-02-28 ENCOUNTER — Other Ambulatory Visit: Payer: Self-pay | Admitting: *Deleted

## 2024-02-28 DIAGNOSIS — Z79899 Other long term (current) drug therapy: Secondary | ICD-10-CM

## 2024-02-29 ENCOUNTER — Telehealth: Payer: Self-pay | Admitting: *Deleted

## 2024-02-29 NOTE — Telephone Encounter (Signed)
 Patient contacted the office stating she is having trouble getting her Taltz prescription. Patient states she was supposed to have it delivered today but [facility] has not gotten it out to her and now they are advising her they need an authorization. Patient states she wants to know what she needs to do. Patient will come pick up a sample to continue on loading dose.

## 2024-03-01 ENCOUNTER — Other Ambulatory Visit: Payer: Self-pay | Admitting: Family

## 2024-03-01 DIAGNOSIS — Z1211 Encounter for screening for malignant neoplasm of colon: Secondary | ICD-10-CM

## 2024-03-01 NOTE — Telephone Encounter (Signed)
 Spoke with [employer]. Their authorization is correct 3 pens for first 28 days then 2 pens per 28 days x 3 fills then 1 pen per 28 days for maintenance  Per rep, pharmacy is seeing rejection again that patient must fill with [place] [facility]. Email sent to reps at [facility] for override to be placed correctly  Phone: [phone]  Chesley Mires, PharmD, MPH, BCPS, CPP Clinical Pharmacist (Rheumatology and Pulmonology)

## 2024-03-02 LAB — CMP14+EGFR
ALT: 25 IU/L (ref 0–32)
AST: 23 IU/L (ref 0–40)
Albumin: 4.2 g/dL (ref 3.8–4.9)
Alkaline Phosphatase: 75 IU/L (ref 44–121)
BUN/Creatinine Ratio: 20 (ref 9–23)
BUN: 13 mg/dL (ref 6–24)
Bilirubin Total: 0.3 mg/dL (ref 0.0–1.2)
CO2: 19 mmol/L — ABNORMAL LOW (ref 20–29)
Calcium: 9.6 mg/dL (ref 8.7–10.2)
Chloride: 104 mmol/L (ref 96–106)
Creatinine, Ser: 0.64 mg/dL (ref 0.57–1.00)
Globulin, Total: 2.5 g/dL (ref 1.5–4.5)
Glucose: 106 mg/dL — ABNORMAL HIGH (ref 70–99)
Potassium: 4.9 mmol/L (ref 3.5–5.2)
Sodium: 140 mmol/L (ref 134–144)
Total Protein: 6.7 g/dL (ref 6.0–8.5)
eGFR: 107 mL/min/{1.73_m2} (ref >59)

## 2024-03-02 LAB — CBC WITH DIFFERENTIAL/PLATELET
Basophils Absolute: 0.1 10*3/uL (ref 0.0–0.2)
Basos: 1 %
EOS (ABSOLUTE): 0.2 10*3/uL (ref 0.0–0.4)
Eos: 3 %
Hematocrit: 44.2 % (ref 34.0–46.6)
Hemoglobin: 14.2 g/dL (ref 11.1–15.9)
Immature Grans (Abs): 0.1 10*3/uL (ref 0.0–0.1)
Immature Granulocytes: 1 %
Lymphocytes Absolute: 1.6 10*3/uL (ref 0.7–3.1)
Lymphs: 23 %
MCH: 27.4 pg (ref 26.6–33.0)
MCHC: 32.1 g/dL (ref 31.5–35.7)
MCV: 85 fL (ref 79–97)
Monocytes Absolute: 0.9 10*3/uL (ref 0.1–0.9)
Monocytes: 13 %
Neutrophils Absolute: 4.3 10*3/uL (ref 1.4–7.0)
Neutrophils: 59 %
Platelets: 308 10*3/uL (ref 150–450)
RBC: 5.18 x10E6/uL (ref 3.77–5.28)
RDW: 12.2 % (ref 11.7–15.4)
WBC: 7.1 10*3/uL (ref 3.4–10.8)

## 2024-03-03 ENCOUNTER — Encounter: Payer: Self-pay | Admitting: *Deleted

## 2024-03-05 ENCOUNTER — Encounter (HOSPITAL_BASED_OUTPATIENT_CLINIC_OR_DEPARTMENT_OTHER): Payer: Self-pay | Admitting: Obstetrics & Gynecology

## 2024-03-06 ENCOUNTER — Other Ambulatory Visit (HOSPITAL_BASED_OUTPATIENT_CLINIC_OR_DEPARTMENT_OTHER): Payer: Self-pay | Admitting: Obstetrics & Gynecology

## 2024-03-06 DIAGNOSIS — N920 Excessive and frequent menstruation with regular cycle: Secondary | ICD-10-CM

## 2024-03-06 DIAGNOSIS — G43009 Migraine without aura, not intractable, without status migrainosus: Secondary | ICD-10-CM

## 2024-03-06 MED ORDER — NORETHINDRONE ACETATE 5 MG PO TABS: 2.5000 mg | ORAL_TABLET | Freq: Every day | ORAL | 0 refills | Status: DC

## 2024-03-06 NOTE — Telephone Encounter (Signed)
 Another email sent to [facility] reps regarding [employer] override

## 2024-03-17 ENCOUNTER — Encounter: Payer: Self-pay | Admitting: Physician Assistant

## 2024-03-19 NOTE — Progress Notes (Unsigned)
 Brigitte Canard, PA-C [address] [place], [place]  [address] Phone: [phone]   Gastroenterology Consultation  Referring Provider:     Felicita Horns, FNP Primary Care Physician:  Felicita Horns, FNP Primary Gastroenterologist:  Brigitte Canard, PA-C / Dr. Laurell Pond  Reason for Consultation:     Discuss colonoscopy        HPI:   Sandy [name] is a 53 y.o. y/o female referred for consultation & management  by Felicita Horns, FNP.  She is referred to schedule screening colonoscopy due to age.  She has no GI concerns today.  Has occasional mild irregular bowel habit.  Denies rectal bleeding, abdominal pain, or weight loss.  Has family history of paternal aunt who had colon cancer.  No first-degree relatives with colon cancer.  Patient states she had 1 colonoscopy at age 32 (around 2009) and had benign polyps removed.  Results not available.  No other colonoscopies.  03/01/2024 labs: Normal CBC and CMP.  Hemoglobin 14.2.  Medical history significant for psoriasis and psoriatic arthritis.  Recently changed from [place] to [place].    Past Medical History:  Diagnosis Date   Abnormal Pap smear    H/O CIN II   Anemia    with iron infusions   Back pain    Costochondritis    Migraines    Psoriasis    STD (sexually transmitted disease)    HSV II    Past Surgical History:  Procedure Laterality Date   ADENOIDECTOMY     APPENDECTOMY  12/07/1990   53 y/o   CRYOTHERAPY  12/08/2007   of cx   DERMOID CYST  EXCISION  12/07/1990   left ? ovary/appendectomy   OVARIAN CYST REMOVAL  12/07/1998   urethral stretching  12/08/1991   WISDOM TOOTH EXTRACTION      Prior to Admission medications   Medication Sig Start Date End Date Taking? Authorizing Provider  albuterol (VENTOLIN HFA) 108 (90 Base) MCG/ACT inhaler TAKE 2 PUFFS BY MOUTH EVERY 4 TO 6 HOURS AS NEEDED    [provider]  atorvastatin (LIPITOR) 10 MG tablet Take 1 tablet (10 mg total) by mouth daily. 05/21/23    Dugal, Tabitha, FNP  celecoxib (CELEBREX) 100 MG capsule TAKE 1 CAPSULE BY MOUTH AS NEEDED UP TO EVERY 12 HOURS AS NEEDED FOR JOINT PAIN 02/14/24   Matt Song, MD  Cholecalciferol (VITAMIN D) 50 MCG (2000 UT) CAPS Take by mouth.    [provider]  Cholecalciferol 1.25 MG (50000 UT) TABS Take 1 tablet by mouth once a week. 02/21/24   Dugal, Tabitha, FNP  cyclobenzaprine (FLEXERIL) 10 MG tablet Take 1/2 to one tablet po qhs prn muscle spasm 05/12/23   Dugal, Tabitha, FNP  DULoxetine (CYMBALTA) 30 MG capsule Take 1 capsule (30 mg total) by mouth daily. 01/19/24   Dugal, Tabitha, FNP  gabapentin (NEURONTIN) 100 MG capsule Take 1 capsule (100 mg total) by mouth 3 (three) times daily. 02/16/24   Dugal, Tabitha, FNP  hydrOXYzine (VISTARIL) 25 MG capsule Take 1 capsule (25 mg total) by mouth every 8 (eight) hours as needed. 11/01/23   Dugal, Tabitha, FNP  ixekizumab (TALTZ) 80 MG/ML pen Inject 160mg  into the skin at Week 0, then 80mg  at Week 2 01/07/24   Rice, Haig Levan, MD  ixekizumab (TALTZ) 80 MG/ML pen Inject 80mg  into the skin at Week 4, 6, 8, 10 01/07/24   Rice, Haig Levan, MD  ixekizumab (TALTZ) 80 MG/ML pen Inject  80mg  into the skin at Week 12 and every 4 weeks thereafter 01/07/24   Matt Song, MD  metroNIDAZOLE (METROCREAM) 0.75 % cream Apply topically 2 (two) times daily. 04/21/23   [provider]  norethindrone (AYGESTIN) 5 MG tablet Take 0.5 tablets (2.5 mg total) by mouth daily. 03/06/24   Lillian Rein, MD  predniSONE (DELTASONE) 20 MG tablet 2 tabs po daily for 5 days, then 1 tab po daily for 5 days 02/09/24   Copland, Jolena Nay, MD  rizatriptan (MAXALT-MLT) 5 MG disintegrating tablet Take 1 tablet (5 mg total) by mouth as needed for migraine. May repeat in 2 hours if needed 01/18/23   Lillian Rein, MD  SUMAtriptan (IMITREX) 100 MG tablet Take 1 tablet (100 mg total) by mouth every 2 (two) hours as needed for migraine. May repeat in 2 hours if headache persists  or recurs. 12/24/21   Lillian Rein, MD  traZODone (DESYREL) 50 MG tablet TAKE 1 TABLET (50 MG TOTAL) BY MOUTH AT BEDTIME AS NEEDED. FOR SLEEP 02/10/24   Felicita Horns, FNP  valACYclovir (VALTREX) 500 MG tablet Take 1 tablet (500 mg total) by mouth daily. Increase to bid x 3 days with any symptoms. 12/24/21   Lillian Rein, MD  ZORYVE 0.3 % CREA Apply topically. 05/11/23   [provider]    Family History  Problem Relation Age of Onset   Thyroid disease Mother        and maternal side   Hypertension Mother    Heart Problems Mother        heart arythmia   Breast cancer Mother 78       had negative genetic testing   Diabetes Father    Cirrhosis Father    Thrombocytopenia Sister        ITP   Throat cancer Maternal Grandfather    Cervical cancer Paternal Grandmother    Heart attack Maternal Uncle    Colon cancer Paternal Aunt      Social History   Tobacco Use   Smoking status: Never    Passive exposure: Past   Smokeless tobacco: Never  Vaping Use   Vaping status: Never Used  Substance Use Topics   Alcohol use: Yes    Alcohol/week: 1.0 standard drink of alcohol    Types: 1 Standard drinks or equivalent per week    Comment: rarely   Drug use: No    Allergies as of 03/20/2024 - Review Complete 03/20/2024  Allergen Reaction Noted   Codeine Nausea And Vomiting 12/12/2011   Tramadol Nausea And Vomiting 03/16/2018   Sulfa antibiotics Hives 12/12/2011    Review of Systems:    All systems reviewed and negative except where noted in HPI.   Physical Exam:  BP 110/70   Pulse (!) 124   Ht 5' 10.75" (1.797 m)   Wt 285 lb 6.4 oz (129.5 kg)   BMI 40.09 kg/m  No LMP recorded. Patient is premenopausal.  General:   Alert,  Well-developed, well-nourished, obese, pleasant and cooperative in NAD Lungs:  Respirations even and unlabored.  Clear throughout to auscultation.   No wheezes, crackles, or rhonchi. No acute distress. Heart:  Regular rate and rhythm; no murmurs,  clicks, rubs, or gallops. Abdomen:  Normal bowel sounds.  No bruits.  Soft, and non-distended without masses, hepatosplenomegaly or hernias noted.  No Tenderness.  No guarding or rebound tenderness.    Neurologic:  Alert and oriented x3;  grossly normal neurologically. Psych:  Alert and  cooperative. Normal mood and affect.  Imaging Studies: No results found.  Assessment and Plan:   Sandy [name] is a 53 y.o. y/o female has been referred for:   Colon Cancer Screening Scheduling Colonoscopy I discussed risks of colonoscopy with patient to include risk of bleeding, colon perforation, and risk of sedation.  Patient expressed understanding and agrees to proceed with colonoscopy.   Follow up As needed based on Colonoscopy results.  Brigitte Canard, PA-C

## 2024-03-20 ENCOUNTER — Encounter: Payer: Self-pay | Admitting: Physician Assistant

## 2024-03-20 ENCOUNTER — Ambulatory Visit: Admitting: Physician Assistant

## 2024-03-20 VITALS — BP 110/70 | HR 124 | Ht 70.75 in | Wt 285.4 lb

## 2024-03-20 DIAGNOSIS — Z1211 Encounter for screening for malignant neoplasm of colon: Secondary | ICD-10-CM

## 2024-03-20 DIAGNOSIS — Z01818 Encounter for other preprocedural examination: Secondary | ICD-10-CM

## 2024-03-20 MED ORDER — NA SULFATE-K SULFATE-MG SULF 17.5-3.13-1.6 GM/177ML PO SOLN: 1.0000 | Freq: Once | ORAL | 0 refills | Status: AC

## 2024-03-20 NOTE — Patient Instructions (Signed)
 We have sent the following medications to your pharmacy for you to pick up at your convenience: suprep  You have been scheduled for a colonoscopy. Please follow written instructions given to you at your visit today.   If you use inhalers (even only as needed), please bring them with you on the day of your procedure.  DO NOT TAKE 7 DAYS PRIOR TO TEST- Trulicity (dulaglutide) Ozempic, Wegovy (semaglutide) Mounjaro (tirzepatide) Bydureon Bcise (exanatide extended release)  DO NOT TAKE 1 DAY PRIOR TO YOUR TEST Rybelsus (semaglutide) Adlyxin (lixisenatide) Victoza (liraglutide) Byetta (exanatide) ___________________________________________________________________________  _______________________________________________________  If your blood pressure at your visit was 140/90 or greater, please contact your primary care physician to follow up on this.  _______________________________________________________  If you are age 26 or older, your body mass index should be between 23-30. Your Body mass index is 40.09 kg/m. If this is out of the aforementioned range listed, please consider follow up with your Primary Care Provider.  If you are age 38 or younger, your body mass index should be between 19-25. Your Body mass index is 40.09 kg/m. If this is out of the aformentioned range listed, please consider follow up with your Primary Care Provider.   ________________________________________________________  The [facility] providers would like to encourage you to use MYCHART to communicate with providers for non-urgent requests or questions.  Due to long hold times on the telephone, sending your provider a message by [facility] may be a faster and more efficient way to get a response.  Please allow 48 business hours for a response.  Please remember that this is for non-urgent requests.  _______________________________________________________

## 2024-03-21 NOTE — Telephone Encounter (Signed)
 ATC patient to see if Sandy [name] was filled. Unable to reach. VM box is full

## 2024-03-22 ENCOUNTER — Ambulatory Visit: Admitting: Internal Medicine

## 2024-03-22 ENCOUNTER — Ambulatory Visit: Admitting: Family
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
## 2024-03-27 ENCOUNTER — Encounter: Payer: Self-pay | Admitting: Internal Medicine

## 2024-03-27 ENCOUNTER — Ambulatory Visit: Admitting: Family Medicine

## 2024-03-27 ENCOUNTER — Ambulatory Visit: Payer: Managed Care, Other (non HMO) | Attending: Internal Medicine | Admitting: Internal Medicine

## 2024-03-27 VITALS — BP 113/80 | HR 101 | Resp 14 | Ht 71.0 in | Wt 287.0 lb

## 2024-03-27 DIAGNOSIS — M1990 Unspecified osteoarthritis, unspecified site: Secondary | ICD-10-CM | POA: Diagnosis not present

## 2024-03-27 DIAGNOSIS — Z79899 Other long term (current) drug therapy: Secondary | ICD-10-CM | POA: Diagnosis not present

## 2024-03-27 DIAGNOSIS — L409 Psoriasis, unspecified: Secondary | ICD-10-CM | POA: Diagnosis not present

## 2024-03-27 MED ORDER — TRIAMCINOLONE ACETONIDE 0.1 % EX CREA
1.0000 | TOPICAL_CREAM | Freq: Two times a day (BID) | CUTANEOUS | 0 refills | Status: DC | PRN
Start: 2024-03-27 — End: 2024-06-22

## 2024-03-27 MED ORDER — BETAMETHASONE DIPROPIONATE AUG 0.05 % EX OINT: TOPICAL_OINTMENT | Freq: Two times a day (BID) | CUTANEOUS | 0 refills | Status: DC | PRN

## 2024-03-28 DIAGNOSIS — L4050 Arthropathic psoriasis, unspecified: Secondary | ICD-10-CM | POA: Insufficient documentation

## 2024-03-28 NOTE — Progress Notes (Unsigned)
 Sandy Kleman T. Saige Busby, MD, CAQ Sports Medicine [facility] at [facility] [address] [place], [address]  Phone: [phone]  FAX: [phone]  Sandy [name] - 53 y.o. female  MRN [id number]  Date of Birth: 27-May-1971  Date: 03/29/2024  PCP: Felicita Horns, FNP  Referral: No ref. provider found  Chief Complaint  Patient presents with   Cervicalgia    6 week follow up   Subjective:   Sandy [name] is a 53 y.o. very pleasant female patient with Body mass index is 40.5 kg/m. who presents with the following:  Patient is here to follow-up with me about chronic neck pain.  I saw her on February 09, 2024, and at that point she had been having some intermittent neck pain and a quite severe for well over 9 months.  I started her on some gabapentin , gave her a round of oral steroids, and also had her begin a McKenzie style rehab program.  At that point she wanted to hold off on physical therapy.  She really has done a good job with her rehab and she is focused and on some McKenzie protocol as well as shoulder blade movement and scapular stability.  She does have a history of psoriatic arthritis and she is following with Dr. Rodell Citrin.  Rehab seems to be helping Some tightness in the shoulder blade area Notices some in the morning  She really does think she is quite a bit better. Motion is significantly improved  Review of Systems is noted in the HPI, as appropriate  Objective:   BP 100/60 (BP Location: Right Arm, Patient Position: Sitting, Cuff Size: Large)   Pulse 88   Temp 98.3 F (36.8 C) (Temporal)   Ht 5' 10.75" (1.797 m)   Wt 288 lb 6 oz (130.8 kg)   SpO2 98%   BMI 40.50 kg/m   GEN: No acute distress; alert,appropriate. PULM: Breathing comfortably in no respiratory distress PSYCH: Normally interactive.    CERVICAL SPINE EXAM Range of motion: Flexion, extension, lateral bending, and rotation: At this point, range of motion is essentially  normal Spurling's: Negative Pain with terminal motion: Full Spinous Processes: NT SCM: NT Upper paracervical muscles: Some tenderness to palpation on the left side Upper traps: Mild tenderness to palpation on the left C5-T1 intact, sensation and motor   Shoulder: L Inspection: No muscle wasting or winging Ecchymosis/edema: neg  AC joint, scapula, clavicle: NT Abduction: full, 5/5 Flexion: full, 5/5 IR, full, lift-off: 5/5 ER at neutral: full, 5/5 AC crossover and compression: neg Neer: neg Hawkins: neg Drop Test: neg Jobe: neg Supraspinatus insertion: NT Bicipital groove: NT Speed's: neg Yergason's: neg Sulcus sign: neg Scapular dyskinesis: none C5-T1 intact Sensation intact Grip 5/5   Laboratory and Imaging Data:  Assessment and Plan:     ICD-10-CM   1. Chronic neck pain  M54.2    G89.29     2. Inflammatory arthritis  M19.90      She is really doing quite a bit better.  She has done a great job doing her home rehab, working on neck range of motion, McKenzie protocol, and scapular stability and trap work.  I think she can follow-up with me on a as needed basis only.  [employer] speech-to-text software was used for transcription in this dictation.  Possible transcriptional errors can occur using Animal nutritionist.   Signed,  Ranny Bye. Jonica Bickhart, MD   Outpatient Encounter Medications as of 03/29/2024  Medication Sig  albuterol (VENTOLIN HFA) 108 (90 Base) MCG/ACT inhaler TAKE 2 PUFFS BY MOUTH EVERY 4 TO 6 HOURS AS NEEDED   atorvastatin  (LIPITOR) 10 MG tablet Take 1 tablet (10 mg total) by mouth daily.   augmented betamethasone  dipropionate (DIPROLENE -AF) 0.05 % ointment Apply topically 2 (two) times daily as needed. For palms and soles   celecoxib  (CELEBREX ) 100 MG capsule TAKE 1 CAPSULE BY MOUTH AS NEEDED UP TO EVERY 12 HOURS AS NEEDED FOR JOINT PAIN   Cholecalciferol  1.25 MG (50000 UT) TABS Take 1 tablet by mouth once a week.   cyclobenzaprine   (FLEXERIL ) 10 MG tablet Take 1/2 to one tablet po qhs prn muscle spasm   DULoxetine  (CYMBALTA ) 30 MG capsule Take 1 capsule (30 mg total) by mouth daily.   gabapentin  (NEURONTIN ) 100 MG capsule Take 1 capsule (100 mg total) by mouth 3 (three) times daily.   hydrOXYzine  (VISTARIL ) 25 MG capsule Take 1 capsule (25 mg total) by mouth every 8 (eight) hours as needed.   ixekizumab  (TALTZ ) 80 MG/ML pen Inject 160mg  into the skin at Week 0, then 80mg  at Week 2   ixekizumab  (TALTZ ) 80 MG/ML pen Inject 80mg  into the skin at Week 4, 6, 8, 10   ixekizumab  (TALTZ ) 80 MG/ML pen Inject 80mg  into the skin at Week 12 and every 4 weeks thereafter   metroNIDAZOLE (METROCREAM) 0.75 % cream Apply topically 2 (two) times daily.   Multiple Vitamin (MULTIVITAMIN PO) Take by mouth.   Na Sulfate-K Sulfate-Mg Sulfate concentrate (SUPREP) 17.5-3.13-1.6 GM/177ML SOLN    norethindrone  (AYGESTIN ) 5 MG tablet Take 0.5 tablets (2.5 mg total) by mouth daily.   rizatriptan  (MAXALT -MLT) 5 MG disintegrating tablet Take 1 tablet (5 mg total) by mouth as needed for migraine. May repeat in 2 hours if needed   SUMAtriptan  (IMITREX ) 100 MG tablet Take 1 tablet (100 mg total) by mouth every 2 (two) hours as needed for migraine. May repeat in 2 hours if headache persists or recurs.   traZODone  (DESYREL ) 50 MG tablet TAKE 1 TABLET (50 MG TOTAL) BY MOUTH AT BEDTIME AS NEEDED. FOR SLEEP   triamcinolone  cream (KENALOG ) 0.1 % Apply 1 Application topically 2 (two) times daily as needed. For body   valACYclovir  (VALTREX ) 500 MG tablet Take 1 tablet (500 mg total) by mouth daily. Increase to bid x 3 days with any symptoms.   ZORYVE 0.3 % CREA Apply topically.   [DISCONTINUED] Cholecalciferol  (VITAMIN D ) 50 MCG (2000 UT) CAPS Take by mouth once a week.   No facility-administered encounter medications on file as of 03/29/2024.

## 2024-03-29 ENCOUNTER — Ambulatory Visit: Admitting: Family Medicine

## 2024-03-29 ENCOUNTER — Encounter: Payer: Self-pay | Admitting: Family Medicine

## 2024-03-29 VITALS — BP 100/60 | HR 88 | Temp 98.3°F | Ht 70.75 in | Wt 288.4 lb

## 2024-03-29 DIAGNOSIS — M542 Cervicalgia: Secondary | ICD-10-CM | POA: Diagnosis not present

## 2024-03-29 DIAGNOSIS — M1990 Unspecified osteoarthritis, unspecified site: Secondary | ICD-10-CM

## 2024-03-29 DIAGNOSIS — G8929 Other chronic pain: Secondary | ICD-10-CM | POA: Diagnosis not present

## 2024-03-29 DIAGNOSIS — L4050 Arthropathic psoriasis, unspecified: Secondary | ICD-10-CM

## 2024-03-29 LAB — QUANTIFERON-TB GOLD PLUS
Mitogen-NIL: 4.54 [IU]/mL
NIL: 0.01 [IU]/mL
QuantiFERON-TB Gold Plus: NEGATIVE
TB1-NIL: 0.01 [IU]/mL
TB2-NIL: 0.01 [IU]/mL

## 2024-03-29 LAB — COMPREHENSIVE METABOLIC PANEL WITH GFR
AG Ratio: 1.5 (calc) (ref 1.0–2.5)
ALT: 17 U/L (ref 6–29)
AST: 16 U/L (ref 10–35)
Albumin: 4.2 g/dL (ref 3.6–5.1)
Alkaline phosphatase (APISO): 72 U/L (ref 37–153)
BUN: 10 mg/dL (ref 7–25)
CO2: 25 mmol/L (ref 20–32)
Calcium: 9.9 mg/dL (ref 8.6–10.4)
Chloride: 104 mmol/L (ref 98–110)
Creat: 0.59 mg/dL (ref 0.50–1.03)
Globulin: 2.8 g/dL (ref 1.9–3.7)
Glucose, Bld: 109 mg/dL — ABNORMAL HIGH (ref 65–99)
Potassium: 4.6 mmol/L (ref 3.5–5.3)
Sodium: 137 mmol/L (ref 135–146)
Total Bilirubin: 0.5 mg/dL (ref 0.2–1.2)
Total Protein: 7 g/dL (ref 6.1–8.1)
eGFR: 109 mL/min/{1.73_m2} (ref >=60)

## 2024-03-29 LAB — CBC WITH DIFFERENTIAL/PLATELET
Absolute Lymphocytes: 2035 {cells}/uL (ref 850–3900)
Absolute Monocytes: 1071 {cells}/uL — ABNORMAL HIGH (ref 200–950)
Basophils Absolute: 85 {cells}/uL (ref 0–200)
Basophils Relative: 0.8 %
Eosinophils Absolute: 233 {cells}/uL (ref 15–500)
Eosinophils Relative: 2.2 %
HCT: 44.6 % (ref 35.0–45.0)
Hemoglobin: 14.5 g/dL (ref 11.7–15.5)
MCH: 27.8 pg (ref 27.0–33.0)
MCHC: 32.5 g/dL (ref 32.0–36.0)
MCV: 85.6 fL (ref 80.0–100.0)
MPV: 10.1 fL (ref 7.5–12.5)
Monocytes Relative: 10.1 %
Neutro Abs: 7176 {cells}/uL (ref 1500–7800)
Neutrophils Relative %: 67.7 %
Platelets: 358 10*3/uL (ref 140–400)
RBC: 5.21 10*6/uL — ABNORMAL HIGH (ref 3.80–5.10)
RDW: 12.4 % (ref 11.0–15.0)
Total Lymphocyte: 19.2 %
WBC: 10.6 10*3/uL (ref 3.8–10.8)

## 2024-03-29 LAB — C-REACTIVE PROTEIN: CRP: 3.5 mg/L (ref <8.0)

## 2024-03-31 ENCOUNTER — Telehealth: Payer: Self-pay | Admitting: Family

## 2024-03-31 NOTE — Telephone Encounter (Signed)
-----   Message from Felicita Horns sent at 03/31/2024  8:36 AM EDT ----- Can we please schedule appt for pt to go over sleep study results ----- Message ----- From: Estelita Helena Sent: 03/31/2024   8:28 AM EDT To: Felicita Horns, FNP

## 2024-03-31 NOTE — Telephone Encounter (Signed)
 Pt has been scheduled with Tabitha on 04/03/24.

## 2024-03-31 NOTE — Telephone Encounter (Signed)
 Called patient not able to lvm as it was full. Sent MyChart message.

## 2024-04-03 ENCOUNTER — Ambulatory Visit: Admitting: Family

## 2024-04-03 ENCOUNTER — Encounter: Payer: Self-pay | Admitting: Family

## 2024-04-03 VITALS — BP 124/88 | HR 86 | Temp 97.3°F | Ht 70.75 in | Wt 289.4 lb

## 2024-04-03 DIAGNOSIS — J309 Allergic rhinitis, unspecified: Secondary | ICD-10-CM | POA: Insufficient documentation

## 2024-04-03 DIAGNOSIS — G4733 Obstructive sleep apnea (adult) (pediatric): Secondary | ICD-10-CM

## 2024-04-03 NOTE — Assessment & Plan Note (Signed)
 New diagnosis Suspect a mix of desats plus sinus issues Weight management discussed as weight loss may improve apnea severity. Recommendation for CPAP therapy

## 2024-04-03 NOTE — Assessment & Plan Note (Signed)
 Discussed daily use of flonase and also zyrtec Could consider singulair as well.  May help with some mild reduction in sleep apnea severity.

## 2024-04-03 NOTE — Progress Notes (Signed)
 Established Patient Office Visit  Subjective:      CC:  Chief Complaint  Patient presents with   Follow-up    Review sleep study results    HPI: Sandy [name] is a 53 y.o. female presenting on 04/03/2024 for Follow-up (Review sleep study results) . OSA: recent sleep study 03/30/24 revealed moderate obstructive sleep apnea based on the 4% criterion however based on the 3% criterion severity is elevated to severe obstructive sleep apnea.PAP therapy is recommended. Total f 45 apneas and 133 hypopneas identified. Obstructive events totaled 172. 120 desaturations baseline oxygen 98% lowest oxygen with 4% desat 84%.   Sinus congestion: changed to zyrtec and also started with flonase (however not consistent)    Social history:  Relevant past medical, surgical, family and social history reviewed and updated as indicated. Interim medical history since our last visit reviewed.  Allergies and medications reviewed and updated.  DATA REVIEWED: CHART IN EPIC     ROS: Negative unless specifically indicated above in HPI.    Current Outpatient Medications:    albuterol (VENTOLIN HFA) 108 (90 Base) MCG/ACT inhaler, TAKE 2 PUFFS BY MOUTH EVERY 4 TO 6 HOURS AS NEEDED, Disp: , Rfl:    atorvastatin  (LIPITOR) 10 MG tablet, Take 1 tablet (10 mg total) by mouth daily., Disp: 90 tablet, Rfl: 3   augmented betamethasone  dipropionate (DIPROLENE -AF) 0.05 % ointment, Apply topically 2 (two) times daily as needed. For palms and soles, Disp: 15 g, Rfl: 0   celecoxib  (CELEBREX ) 100 MG capsule, TAKE 1 CAPSULE BY MOUTH AS NEEDED UP TO EVERY 12 HOURS AS NEEDED FOR JOINT PAIN, Disp: 60 capsule, Rfl: 2   Cholecalciferol  1.25 MG (50000 UT) TABS, Take 1 tablet by mouth once a week., Disp: 12 tablet, Rfl: 0   cyclobenzaprine  (FLEXERIL ) 10 MG tablet, Take 1/2 to one tablet po qhs prn muscle spasm, Disp: 30 tablet, Rfl: 0   DULoxetine  (CYMBALTA ) 30 MG capsule, Take 1 capsule (30 mg total) by mouth daily., Disp: 90  capsule, Rfl: 0   gabapentin  (NEURONTIN ) 100 MG capsule, Take 1 capsule (100 mg total) by mouth 3 (three) times daily., Disp: 30 capsule, Rfl: 0   hydrOXYzine  (VISTARIL ) 25 MG capsule, Take 1 capsule (25 mg total) by mouth every 8 (eight) hours as needed., Disp: 30 capsule, Rfl: 0   ixekizumab  (TALTZ ) 80 MG/ML pen, Inject 160mg  into the skin at Week 0, then 80mg  at Week 2, Disp: 3 mL, Rfl: 0   ixekizumab  (TALTZ ) 80 MG/ML pen, Inject 80mg  into the skin at Week 4, 6, 8, 10, Disp: 2 mL, Rfl: 1   ixekizumab  (TALTZ ) 80 MG/ML pen, Inject 80mg  into the skin at Week 12 and every 4 weeks thereafter, Disp: 1 mL, Rfl: 0   metroNIDAZOLE (METROCREAM) 0.75 % cream, Apply topically 2 (two) times daily., Disp: , Rfl:    Multiple Vitamin (MULTIVITAMIN PO), Take by mouth., Disp: , Rfl:    Na Sulfate-K Sulfate-Mg Sulfate concentrate (SUPREP) 17.5-3.13-1.6 GM/177ML SOLN, , Disp: , Rfl:    norethindrone  (AYGESTIN ) 5 MG tablet, Take 0.5 tablets (2.5 mg total) by mouth daily., Disp: 90 tablet, Rfl: 0   rizatriptan  (MAXALT -MLT) 5 MG disintegrating tablet, Take 1 tablet (5 mg total) by mouth as needed for migraine. May repeat in 2 hours if needed, Disp: 10 tablet, Rfl: 11   SUMAtriptan  (IMITREX ) 100 MG tablet, Take 1 tablet (100 mg total) by mouth every 2 (two) hours as needed for migraine. May repeat in 2 hours if headache  persists or recurs., Disp: 9 tablet, Rfl: 1   traZODone  (DESYREL ) 50 MG tablet, TAKE 1 TABLET (50 MG TOTAL) BY MOUTH AT BEDTIME AS NEEDED. FOR SLEEP, Disp: 90 tablet, Rfl: 3   triamcinolone  cream (KENALOG ) 0.1 %, Apply 1 Application topically 2 (two) times daily as needed. For body, Disp: 454 g, Rfl: 0   valACYclovir  (VALTREX ) 500 MG tablet, Take 1 tablet (500 mg total) by mouth daily. Increase to bid x 3 days with any symptoms., Disp: 30 tablet, Rfl: 12   ZORYVE 0.3 % CREA, Apply topically., Disp: , Rfl:       Objective:    BP 124/88 (BP Location: Left Arm, Patient Position: Sitting, Cuff Size:  Large)   Pulse 86   Temp (!) 97.3 F (36.3 C) (Temporal)   Ht 5' 10.75" (1.797 m)   Wt 289 lb 6.4 oz (131.3 kg)   SpO2 100%   BMI 40.65 kg/m   Wt Readings from Last 3 Encounters:  04/03/24 289 lb 6.4 oz (131.3 kg)  03/29/24 288 lb 6 oz (130.8 kg)  03/27/24 287 lb (130.2 kg)    Physical Exam Vitals reviewed.  Constitutional:      General: She is not in acute distress.    Appearance: Normal appearance. She is obese. She is not ill-appearing, toxic-appearing or diaphoretic.  HENT:     Head: Normocephalic.     Right Ear: Tympanic membrane normal.     Left Ear: Tympanic membrane normal.     Nose: Nose normal.     Mouth/Throat:     Mouth: Mucous membranes are dry.     Pharynx: No oropharyngeal exudate or posterior oropharyngeal erythema.  Eyes:     Extraocular Movements: Extraocular movements intact.     Pupils: Pupils are equal, round, and reactive to light.  Cardiovascular:     Rate and Rhythm: Normal rate and regular rhythm.     Pulses: Normal pulses.     Heart sounds: Normal heart sounds.  Pulmonary:     Effort: Pulmonary effort is normal.     Breath sounds: Normal breath sounds.  Musculoskeletal:     Cervical back: Normal range of motion.  Neurological:     General: No focal deficit present.     Mental Status: She is alert and oriented to person, place, and time. Mental status is at baseline.  Psychiatric:        Mood and Affect: Mood normal.        Behavior: Behavior normal.        Thought Content: Thought content normal.        Judgment: Judgment normal.           Assessment & Plan:  Severe obstructive sleep apnea in adult Assessment & Plan: New diagnosis Suspect a mix of desats plus sinus issues Weight management discussed as weight loss may improve apnea severity. Recommendation for CPAP therapy    Allergic sinusitis Assessment & Plan: Discussed daily use of flonase and also zyrtec Could consider singulair as well.  May help with some mild  reduction in sleep apnea severity.       Return in about 2 months (around 06/03/2024) for follow up sleep apnea.  Felicita Horns, MSN, APRN, FNP-C  [facility]

## 2024-04-11 ENCOUNTER — Encounter: Payer: Self-pay | Admitting: Internal Medicine

## 2024-04-12 ENCOUNTER — Encounter (HOSPITAL_COMMUNITY): Payer: Self-pay

## 2024-04-15 ENCOUNTER — Other Ambulatory Visit: Payer: Self-pay | Admitting: Family

## 2024-04-15 DIAGNOSIS — F4323 Adjustment disorder with mixed anxiety and depressed mood: Secondary | ICD-10-CM

## 2024-04-15 DIAGNOSIS — E782 Mixed hyperlipidemia: Secondary | ICD-10-CM

## 2024-04-18 ENCOUNTER — Telehealth: Payer: Self-pay | Admitting: Physician Assistant

## 2024-04-18 NOTE — Telephone Encounter (Signed)
 Patient called and stated that she has a procedure scheduled for tomorrow, but she has been eating seed and popcorn. Patient is not sure what to do now. Patient is requesting a call back.Please advise.

## 2024-04-18 NOTE — Telephone Encounter (Signed)
 Spoke with pt. She stated that she had salad, tacos with beans on Saturday and Sunday. Also had a bag popcorn yesterday. Just realized she was not supposed to eat those things. Wondering if she can still proceed with procedure or does she need to reschedule?   Please advise.

## 2024-04-18 NOTE — Telephone Encounter (Signed)
 Spoke with MD, and advised pt that she could proceed with procedure tomorrow morning. Advised pt to drink an additional 16oz of water after doing 1st half of prep tonight. Also, advised pt that bowel movements just be liquid and fairly clear to where she could see through to the bottom of the toilet. Pt verbalized understanding and had no further concerns at the end of the call.

## 2024-04-19 ENCOUNTER — Encounter: Payer: Self-pay | Admitting: Internal Medicine

## 2024-04-19 ENCOUNTER — Ambulatory Visit: Admitting: Internal Medicine

## 2024-04-19 VITALS — BP 137/79 | HR 68 | Temp 98.1°F | Resp 16 | Ht 70.0 in | Wt 285.0 lb

## 2024-04-19 DIAGNOSIS — D124 Benign neoplasm of descending colon: Secondary | ICD-10-CM | POA: Diagnosis not present

## 2024-04-19 DIAGNOSIS — Z1211 Encounter for screening for malignant neoplasm of colon: Secondary | ICD-10-CM | POA: Diagnosis present

## 2024-04-19 DIAGNOSIS — K5730 Diverticulosis of large intestine without perforation or abscess without bleeding: Secondary | ICD-10-CM | POA: Diagnosis not present

## 2024-04-19 DIAGNOSIS — D123 Benign neoplasm of transverse colon: Secondary | ICD-10-CM | POA: Diagnosis not present

## 2024-04-19 MED ORDER — SODIUM CHLORIDE 0.9 % IV SOLN: 500.0000 mL | Freq: Once | INTRAVENOUS | Status: DC

## 2024-04-19 NOTE — Progress Notes (Signed)
 GASTROENTEROLOGY PROCEDURE H&P NOTE   Primary Care Physician: Felicita Horns, FNP    Reason for Procedure:   Colon cancer screening  Plan:    colonoscopy  Patient is appropriate for endoscopic procedure(s) in the ambulatory ([facility]) setting.  The nature of the procedure, as well as the risks, benefits, and alternatives were carefully and thoroughly reviewed with the patient. Ample time for discussion and questions allowed. The patient understood, was satisfied, and agreed to proceed.     HPI: Sandy [name] is a 53 y.o. female who presents for screening colonoscopy.  Medical history as below.  Tolerated the prep.  No recent chest pain or shortness of breath.  No abdominal pain today.  Past Medical History:  Diagnosis Date   Abnormal Pap smear    H/O CIN II   Anemia    with iron infusions   Back pain    Costochondritis    Migraines    Psoriasis    STD (sexually transmitted disease)    HSV II    Past Surgical History:  Procedure Laterality Date   ADENOIDECTOMY     APPENDECTOMY  12/07/1990   53 y/o   CRYOTHERAPY  12/08/2007   of cx   DERMOID CYST  EXCISION  12/07/1990   left ? ovary/appendectomy   OVARIAN CYST REMOVAL  12/07/1998   urethral stretching  12/08/1991   WISDOM TOOTH EXTRACTION      Prior to Admission medications   Medication Sig Start Date End Date Taking? Authorizing Provider  atorvastatin  (LIPITOR) 10 MG tablet TAKE 1 TABLET BY MOUTH EVERY DAY 04/17/24  Yes Dugal, Tabitha, FNP  celecoxib  (CELEBREX ) 100 MG capsule TAKE 1 CAPSULE BY MOUTH AS NEEDED UP TO EVERY 12 HOURS AS NEEDED FOR JOINT PAIN 02/14/24  Yes Rice, Haig Levan, MD  DULoxetine  (CYMBALTA ) 30 MG capsule TAKE 1 CAPSULE BY MOUTH EVERY DAY 04/17/24  Yes Dugal, Melba Spittle, FNP  ixekizumab  (TALTZ ) 80 MG/ML pen Inject 160mg  into the skin at Week 0, then 80mg  at Week 2 01/07/24  Yes Rice, Haig Levan, MD  ixekizumab  (TALTZ ) 80 MG/ML pen Inject 80mg  into the skin at Week 4, 6, 8, 10 01/07/24  Yes Rice,  Haig Levan, MD  ixekizumab  (TALTZ ) 80 MG/ML pen Inject 80mg  into the skin at Week 12 and every 4 weeks thereafter 01/07/24  Yes Rice, Haig Levan, MD  norethindrone  (AYGESTIN ) 5 MG tablet Take 0.5 tablets (2.5 mg total) by mouth daily. 03/06/24  Yes Lillian Rein, MD  triamcinolone  cream (KENALOG ) 0.1 % Apply 1 Application topically 2 (two) times daily as needed. For body 03/27/24  Yes Rice, Haig Levan, MD  albuterol (VENTOLIN HFA) 108 (90 Base) MCG/ACT inhaler TAKE 2 PUFFS BY MOUTH EVERY 4 TO 6 HOURS AS NEEDED    [provider]  augmented betamethasone  dipropionate (DIPROLENE -AF) 0.05 % ointment Apply topically 2 (two) times daily as needed. For palms and soles 03/27/24   Rice, Haig Levan, MD  Cholecalciferol  1.25 MG (50000 UT) TABS Take 1 tablet by mouth once a week. 02/21/24   Dugal, Tabitha, FNP  cyclobenzaprine  (FLEXERIL ) 10 MG tablet Take 1/2 to one tablet po qhs prn muscle spasm 05/12/23   Dugal, Tabitha, FNP  gabapentin  (NEURONTIN ) 100 MG capsule Take 1 capsule (100 mg total) by mouth 3 (three) times daily. 02/16/24   Dugal, Tabitha, FNP  hydrOXYzine  (VISTARIL ) 25 MG capsule Take 1 capsule (25 mg total) by mouth every 8 (eight) hours as needed. 11/01/23   Felicita Horns, FNP  metroNIDAZOLE (METROCREAM) 0.75 % cream Apply topically 2 (two) times daily. 04/21/23   [provider]  Multiple Vitamin (MULTIVITAMIN PO) Take by mouth.    [provider]  rizatriptan  (MAXALT -MLT) 5 MG disintegrating tablet Take 1 tablet (5 mg total) by mouth as needed for migraine. May repeat in 2 hours if needed 01/18/23   Lillian Rein, MD  SUMAtriptan  (IMITREX ) 100 MG tablet Take 1 tablet (100 mg total) by mouth every 2 (two) hours as needed for migraine. May repeat in 2 hours if headache persists or recurs. 12/24/21   Lillian Rein, MD  traZODone  (DESYREL ) 50 MG tablet TAKE 1 TABLET (50 MG TOTAL) BY MOUTH AT BEDTIME AS NEEDED. FOR SLEEP 02/10/24   Felicita Horns, FNP  valACYclovir   (VALTREX ) 500 MG tablet Take 1 tablet (500 mg total) by mouth daily. Increase to bid x 3 days with any symptoms. 12/24/21   Lillian Rein, MD  ZORYVE 0.3 % CREA Apply topically. 05/11/23   [provider]    Current Outpatient Medications  Medication Sig Dispense Refill   atorvastatin  (LIPITOR) 10 MG tablet TAKE 1 TABLET BY MOUTH EVERY DAY 30 tablet 5   celecoxib  (CELEBREX ) 100 MG capsule TAKE 1 CAPSULE BY MOUTH AS NEEDED UP TO EVERY 12 HOURS AS NEEDED FOR JOINT PAIN 60 capsule 2   DULoxetine  (CYMBALTA ) 30 MG capsule TAKE 1 CAPSULE BY MOUTH EVERY DAY 30 capsule 5   ixekizumab  (TALTZ ) 80 MG/ML pen Inject 160mg  into the skin at Week 0, then 80mg  at Week 2 3 mL 0   ixekizumab  (TALTZ ) 80 MG/ML pen Inject 80mg  into the skin at Week 4, 6, 8, 10 2 mL 1   ixekizumab  (TALTZ ) 80 MG/ML pen Inject 80mg  into the skin at Week 12 and every 4 weeks thereafter 1 mL 0   norethindrone  (AYGESTIN ) 5 MG tablet Take 0.5 tablets (2.5 mg total) by mouth daily. 90 tablet 0   triamcinolone  cream (KENALOG ) 0.1 % Apply 1 Application topically 2 (two) times daily as needed. For body 454 g 0   albuterol (VENTOLIN HFA) 108 (90 Base) MCG/ACT inhaler TAKE 2 PUFFS BY MOUTH EVERY 4 TO 6 HOURS AS NEEDED     augmented betamethasone  dipropionate (DIPROLENE -AF) 0.05 % ointment Apply topically 2 (two) times daily as needed. For palms and soles 15 g 0   Cholecalciferol  1.25 MG (50000 UT) TABS Take 1 tablet by mouth once a week. 12 tablet 0   cyclobenzaprine  (FLEXERIL ) 10 MG tablet Take 1/2 to one tablet po qhs prn muscle spasm 30 tablet 0   gabapentin  (NEURONTIN ) 100 MG capsule Take 1 capsule (100 mg total) by mouth 3 (three) times daily. 30 capsule 0   hydrOXYzine  (VISTARIL ) 25 MG capsule Take 1 capsule (25 mg total) by mouth every 8 (eight) hours as needed. 30 capsule 0   metroNIDAZOLE (METROCREAM) 0.75 % cream Apply topically 2 (two) times daily.     Multiple Vitamin (MULTIVITAMIN PO) Take by mouth.     rizatriptan   (MAXALT -MLT) 5 MG disintegrating tablet Take 1 tablet (5 mg total) by mouth as needed for migraine. May repeat in 2 hours if needed 10 tablet 11   SUMAtriptan  (IMITREX ) 100 MG tablet Take 1 tablet (100 mg total) by mouth every 2 (two) hours as needed for migraine. May repeat in 2 hours if headache persists or recurs. 9 tablet 1   traZODone  (DESYREL ) 50 MG tablet TAKE 1 TABLET (50 MG TOTAL) BY MOUTH AT BEDTIME AS NEEDED. FOR  SLEEP 90 tablet 3   valACYclovir  (VALTREX ) 500 MG tablet Take 1 tablet (500 mg total) by mouth daily. Increase to bid x 3 days with any symptoms. 30 tablet 12   ZORYVE 0.3 % CREA Apply topically.     Current Facility-Administered Medications  Medication Dose Route Frequency Provider Last Rate Last Admin   0.9 %  sodium chloride  infusion  500 mL Intravenous Once Darric Plante, Amber Bail, MD        Allergies as of 04/19/2024 - Review Complete 04/19/2024  Allergen Reaction Noted   Codeine Nausea And Vomiting 12/12/2011   Sulfa antibiotics Hives 12/12/2011   Tramadol Nausea And Vomiting 03/16/2018    Family History  Problem Relation Age of Onset   Thyroid disease Mother        and maternal side   Hypertension Mother    Heart Problems Mother        heart arythmia   Breast cancer Mother 42       had negative genetic testing   Diabetes Father    Cirrhosis Father    Thrombocytopenia Sister        ITP   Heart attack Maternal Uncle    Colon cancer Paternal Aunt    Esophageal cancer Maternal Grandmother    Throat cancer Maternal Grandfather    Cervical cancer Paternal Grandmother    Colon polyps Neg Hx    Rectal cancer Neg Hx    Stomach cancer Neg Hx     Social History   Socioeconomic History   Marital status: Single    Spouse name: Not on file   Number of children: Not on file   Years of education: Not on file   Highest education level: Bachelor's degree (e.g., BA, AB, BS)  Occupational History    Employer: [employer]    Comment: recreational therapist   Tobacco Use   Smoking status: Never    Passive exposure: Past   Smokeless tobacco: Never  Vaping Use   Vaping status: Never Used  Substance and Sexual Activity   Alcohol use: Yes    Alcohol/week: 1.0 standard drink of alcohol    Types: 1 Standard drinks or equivalent per week    Comment: rarely   Drug use: No   Sexual activity: Not Currently    Birth control/protection: Pill  Other Topics Concern   Not on file  Social History Narrative   Not on file   Social Drivers of Health   Financial Resource Strain: Low Risk  (10/31/2023)   Overall Financial Resource Strain (CARDIA)    Difficulty of Paying Living Expenses: Not very hard  Food Insecurity: No Food Insecurity (10/31/2023)   Hunger Vital Sign    Worried About Running Out of Food in the Last Year: Never true    Ran Out of Food in the Last Year: Never true  Transportation Needs: Unmet Transportation Needs (10/31/2023)   PRAPARE - Transportation    Lack of Transportation (Medical): No    Lack of Transportation (Non-Medical): Yes  Physical Activity: Insufficiently Active (10/31/2023)   Exercise Vital Sign    Days of Exercise per Week: 2 days    Minutes of Exercise per Session: 30 min  Stress: Stress Concern Present (10/31/2023)   [employer] of Occupational Health - Occupational Stress Questionnaire    Feeling of Stress : Very much  Social Connections: Socially Isolated (10/31/2023)   Social Connection and Isolation Panel [NHANES]    Frequency of Communication with Friends and Family: Twice a  week    Frequency of Social Gatherings with Friends and Family: Three times a week    Attends Religious Services: Never    Active Member of Clubs or Organizations: No    Attends Engineer, structural: Not on file    Marital Status: Never married  Intimate Partner Violence: Not on file    Physical Exam: Vital signs in last 24 hours: @BP  139/84   Pulse 94   Temp 98.1 F (36.7 C)   Ht 5\' 10"  (1.778 m)   Wt 285  lb (129.3 kg)   SpO2 94%   BMI 40.89 kg/m  GEN: NAD EYE: Sclerae anicteric ENT: MMM CV: Non-tachycardic Pulm: CTA b/l GI: Soft, NT/ND NEURO:  Alert & Oriented x 3   Laurell Pond, MD [facility]  04/19/2024 9:50 AM

## 2024-04-19 NOTE — Progress Notes (Signed)
 Called to room to assist during endoscopic procedure.  Patient ID and intended procedure confirmed with present staff. Received instructions for my participation in the procedure from the performing physician.

## 2024-04-19 NOTE — Progress Notes (Signed)
 Report to PACU, RN, vss, BBS= Clear.

## 2024-04-19 NOTE — Op Note (Signed)
 [facility] Patient Name: Sandy [name] Procedure Date: 04/19/2024 9:48 AM MRN: [id number] Endoscopist: Nannette Babe , MD, [id number] Age: 53 Referring MD:  Date of Birth: [birth year] Gender: Female Account #: [id number] Procedure:                Colonoscopy Indications:              Screening for colorectal malignant neoplasm, This                            is the patient's first colonoscopy Medicines:                Monitored Anesthesia Care Procedure:                Pre-Anesthesia Assessment:                           - Prior to the procedure, a History and Physical                            was performed, and patient medications and                            allergies were reviewed. The patient's tolerance of                            previous anesthesia was also reviewed. The risks                            and benefits of the procedure and the sedation                            options and risks were discussed with the patient.                            All questions were answered, and informed consent                            was obtained. Prior Anticoagulants: The patient has                            taken no anticoagulant or antiplatelet agents. ASA                            Grade Assessment: III - A patient with severe                            systemic disease. After reviewing the risks and                            benefits, the patient was deemed in satisfactory                            condition to undergo the procedure.  After obtaining informed consent, the colonoscope                            was passed under direct vision. Throughout the                            procedure, the patient's blood pressure, pulse, and                            oxygen saturations were monitored continuously. The                            Colonoscope was introduced through the anus and                            advanced to the cecum,  identified by appendiceal                            orifice and ileocecal valve. The colonoscopy was                            performed without difficulty. The patient tolerated                            the procedure well. The quality of the bowel                            preparation was good. The ileocecal valve,                            appendiceal orifice, and rectum were photographed. Scope In: 10:06:34 AM Scope Out: 10:21:18 AM Scope Withdrawal Time: 0 hours 10 minutes 46 seconds  Total Procedure Duration: 0 hours 14 minutes 44 seconds  Findings:                 The digital rectal exam was normal.                           A 6 mm polyp was found in the transverse colon. The                            polyp was sessile. The polyp was removed with a                            cold snare. Resection and retrieval were complete.                           A 3 mm polyp was found in the descending colon. The                            polyp was sessile. The polyp was removed with a  cold snare. Resection and retrieval were complete.                           Multiple medium-mouthed and small-mouthed                            diverticula were found in the sigmoid colon.                           The retroflexed view of the distal rectum and anal                            verge was normal and showed no anal or rectal                            abnormalities. Complications:            No immediate complications. Estimated Blood Loss:     Estimated blood loss: none. Impression:               - One 6 mm polyp in the transverse colon, removed                            with a cold snare. Resected and retrieved.                           - One 3 mm polyp in the descending colon, removed                            with a cold snare. Resected and retrieved.                           - Mild diverticulosis in the sigmoid colon.                           - The  distal rectum and anal verge are normal on                            retroflexion view. Recommendation:           - Patient has a contact number available for                            emergencies. The signs and symptoms of potential                            delayed complications were discussed with the                            patient. Return to normal activities tomorrow.                            Written discharge instructions were provided to the  patient.                           - Resume previous diet.                           - Continue present medications.                           - Await pathology results.                           - Repeat colonoscopy is recommended. The                            colonoscopy date will be determined after pathology                            results from today's exam become available for                            review. Nannette Babe, MD 04/19/2024 10:33:10 AM This report has been signed electronically.

## 2024-04-19 NOTE — Progress Notes (Signed)
 Pt's states no medical or surgical changes since previsit or office visit.

## 2024-04-19 NOTE — Patient Instructions (Addendum)
 Resume previous diet.                           - Continue present medications.                           - Await pathology results.                           - Repeat colonoscopy is recommended. The                            colonoscopy date will be determined after pathology                            results from today's exam become available for                            review.  Handout on polyps and diverticulosis given.    YOU HAD AN ENDOSCOPIC PROCEDURE TODAY AT THE [facility]:   Refer to the procedure report that was given to you for any specific questions about what was found during the examination.  If the procedure report does not answer your questions, please call your gastroenterologist to clarify.  If you requested that your care partner not be given the details of your procedure findings, then the procedure report has been included in a sealed envelope for you to review at your convenience later.  YOU SHOULD EXPECT: Some feelings of bloating in the abdomen. Passage of more gas than usual.  Walking can help get rid of the air that was put into your GI tract during the procedure and reduce the bloating. If you had a lower endoscopy (such as a colonoscopy or flexible sigmoidoscopy) you may notice spotting of blood in your stool or on the toilet paper. If you underwent a bowel prep for your procedure, you may not have a normal bowel movement for a few days.  Please Note:  You might notice some irritation and congestion in your nose or some drainage.  This is from the oxygen used during your procedure.  There is no need for concern and it should clear up in a day or so.  SYMPTOMS TO REPORT IMMEDIATELY:  Following lower endoscopy (colonoscopy or flexible sigmoidoscopy):  Excessive amounts of blood in the stool  Significant tenderness or worsening of abdominal pains  Swelling of the abdomen that is new, acute  Fever of 100F or higher   For urgent or emergent  issues, a gastroenterologist can be reached at any hour by calling [phone]. Do not use MyChart messaging for urgent concerns.    DIET:  We do recommend a small meal at first, but then you may proceed to your regular diet.  Drink plenty of fluids but you should avoid alcoholic beverages for 24 hours.  ACTIVITY:  You should plan to take it easy for the rest of today and you should NOT DRIVE or use heavy machinery until tomorrow (because of the sedation medicines used during the test).    FOLLOW UP: Our staff will call the number listed on your records the next business day following your procedure.  We will call around 7:15- 8:00 am to check on you and address any questions  or concerns that you may have regarding the information given to you following your procedure. If we do not reach you, we will leave a message.     If any biopsies were taken you will be contacted by phone or by letter within the next 1-3 weeks.  Please call us  at [phone] if you have not heard about the biopsies in 3 weeks.    SIGNATURES/CONFIDENTIALITY: You and/or your care partner have signed paperwork which will be entered into your electronic medical record.  These signatures attest to the fact that that the information above on your After Visit Summary has been reviewed and is understood.  Full responsibility of the confidentiality of this discharge information lies with you and/or your care-partner.

## 2024-04-20 ENCOUNTER — Telehealth: Payer: Self-pay

## 2024-04-20 NOTE — Telephone Encounter (Signed)
  Follow up Call-     04/19/2024    9:13 AM  Call back number  Post procedure Call Back phone  # [phone]  Permission to leave phone message Yes     Patient questions:  Do you have a fever, pain , or abdominal swelling? No. Pain Score  0 *  Have you tolerated food without any problems? Yes.    Have you been able to return to your normal activities? Yes.    Do you have any questions about your discharge instructions: Diet   No. Medications  No. Follow up visit  No.  Do you have questions or concerns about your Care? No.  Actions: * If pain score is 4 or above: No action needed, pain <4.

## 2024-04-24 ENCOUNTER — Ambulatory Visit: Payer: Self-pay | Admitting: Internal Medicine

## 2024-04-24 LAB — SURGICAL PATHOLOGY

## 2024-05-07 ENCOUNTER — Other Ambulatory Visit: Payer: Self-pay | Admitting: Family

## 2024-05-07 DIAGNOSIS — E559 Vitamin D deficiency, unspecified: Secondary | ICD-10-CM

## 2024-05-29 ENCOUNTER — Telehealth: Payer: Self-pay | Admitting: *Deleted

## 2024-05-29 NOTE — Telephone Encounter (Signed)
 Patient contacted the office and requested a sample of Taltz . Patient also asked when she would be due for labs. Patient advised she may come pick up a sample. Patient advised she is due for her next set of lab in July 2025. Sample reserved in the sample fridge for patient. Patient states she will try to come by the office today to pick it up.

## 2024-05-29 NOTE — Telephone Encounter (Signed)
 Medication Samples have been provided to the patient.  Drug name: Taltz        Strength: 80 mg/mL        Qty: 1  LOT: [id number]  Exp.Date: 06/15/2025  Dosing instructions: Inject one pen into the skin every 4 weeks.

## 2024-05-30 ENCOUNTER — Telehealth: Payer: Self-pay | Admitting: Pharmacist

## 2024-05-30 ENCOUNTER — Encounter: Payer: Self-pay | Admitting: Family

## 2024-05-30 NOTE — Telephone Encounter (Signed)
 Received fax from [employer] for Taltz  PA renewal however at last OV, patient was reporting feeling worse. She has appt on 06/21/2024. Will need to updated note to submit to insurance if doing well. PA form requires percent improvement to be noted  Scanned to [facility] for retention and completion after OV on 06/21/24  Ed Mandich, PharmD, MPH, BCPS, CPP Clinical Pharmacist (Rheumatology and Pulmonology)

## 2024-05-30 NOTE — Telephone Encounter (Signed)
 Can we please put in DME order for CPAP but send to [facility] not [facility] please.

## 2024-06-08 NOTE — Telephone Encounter (Signed)
 New DME order placed online faxed last office notes and sleep study results with signed order as requested

## 2024-06-11 ENCOUNTER — Other Ambulatory Visit: Payer: Self-pay | Admitting: Internal Medicine

## 2024-06-12 ENCOUNTER — Ambulatory Visit (HOSPITAL_BASED_OUTPATIENT_CLINIC_OR_DEPARTMENT_OTHER): Admitting: Obstetrics & Gynecology

## 2024-06-12 NOTE — Telephone Encounter (Signed)
 Last Fill: 01/07/2024  Labs: 03/27/2024 Glucose 109, RBC 5.21, Absolute Monocytes 1, 071  TB Gold: 03/27/2024 Neg    Next Visit: 06/21/2024  Last Visit: 03/27/2024  IK:Ednmpjupr arthritis   Current Dose per office note 03/27/2024: Continue Taltz  for at least another month to assess improvement.   Okay to refill Taltz ?

## 2024-06-13 ENCOUNTER — Ambulatory Visit (HOSPITAL_BASED_OUTPATIENT_CLINIC_OR_DEPARTMENT_OTHER): Admitting: Obstetrics & Gynecology

## 2024-06-13 VITALS — BP 121/79 | HR 99 | Wt 292.4 lb

## 2024-06-13 DIAGNOSIS — E559 Vitamin D deficiency, unspecified: Secondary | ICD-10-CM | POA: Diagnosis not present

## 2024-06-13 DIAGNOSIS — L409 Psoriasis, unspecified: Secondary | ICD-10-CM

## 2024-06-13 DIAGNOSIS — Z01411 Encounter for gynecological examination (general) (routine) with abnormal findings: Secondary | ICD-10-CM

## 2024-06-13 DIAGNOSIS — N912 Amenorrhea, unspecified: Secondary | ICD-10-CM | POA: Diagnosis not present

## 2024-06-13 DIAGNOSIS — Z01419 Encounter for gynecological examination (general) (routine) without abnormal findings: Secondary | ICD-10-CM

## 2024-06-13 NOTE — Progress Notes (Signed)
 ANNUAL EXAM Patient name: Sandy [name] MRN [id number]  Date of birth: 10-24-1971 Chief Complaint:   Gynecologic Exam (Had questions regarding norethindrone  ), Psoriasis, vitamin D , and Menopause  History of Present Illness:   Sandy [name] is a 53 y.o. G0P0000 Caucasian female being seen today for a routine annual exam.  Denies vaginal bleeding.  Having some incontinence with cough or sneeze.  She sometimes has leaking with urinary urgency but this is typically when her bladder is full.  She does wear a panty liner.    Having emotional changes with menopausal symptoms.  Having a lot of hot flashes with sweats.  She thought it was some improved but the summer has made it worse.     No LMP recorded. Patient is perimenopausal.   Last pap 12/24/2021. Results were: NILM w/ HRHPV negative. H/O abnormal pap: no Last mammogram: 11/26/2023. Results were: normal. Family h/o breast cancer: yes mother Last colonoscopy: 04/19/2024. Results were: abnormal polyps. F/u 7 years.     02/16/2024    9:40 AM 01/19/2024    7:49 AM 11/01/2023   11:53 AM 05/12/2023   11:13 AM 01/18/2023    3:09 PM  Depression screen PHQ 2/9  Decreased Interest 2 1 3 1  0  Down, Depressed, Hopeless 2 2 3  0 0  PHQ - 2 Score 4 3 6 1  0  Altered sleeping 2 1 3     Tired, decreased energy 3 1 2     Change in appetite 1 1 2     Feeling bad or failure about yourself  0 0 1    Trouble concentrating 2 2 3     Moving slowly or fidgety/restless 1 1 2     Suicidal thoughts 0 0 0    PHQ-9 Score 13 9 19     Difficult doing work/chores Very difficult Very difficult Extremely dIfficult          02/16/2024    9:40 AM 01/19/2024    7:49 AM 11/01/2023   11:53 AM 05/12/2023   11:14 AM  GAD 7 : Generalized Anxiety Score  Nervous, Anxious, on Edge 2 3 3 2   Control/stop worrying 1 3 2 1   Worry too much - different things 1 3 3 1   Trouble relaxing 3 3 3 2   Restless 2 2 2 1   Easily annoyed or irritable 2 3 3 3   Afraid - awful might happen 0 0  0 0  Total GAD 7 Score 11 17 16 10   Anxiety Difficulty Somewhat difficult Extremely difficult Extremely difficult Somewhat difficult     Review of Systems:   Pertinent items are noted in HPI Denies bowel changes or pelvic pain Pertinent History Reviewed:  Reviewed past medical,surgical, social and family history.  Reviewed problem list, medications and allergies. Physical Assessment:   Vitals:   06/13/24 1511  BP: 121/79  Pulse: 99  SpO2: 97%  Weight: 292 lb 6.4 oz (132.6 kg)  Body mass index is 41.96 kg/m.        Physical Examination:   General appearance - well appearing, and in no distress  Mental status - alert, oriented to person, place, and time  Psych:  She has a normal mood and affect  Skin - warm and dry, normal color, no suspicious lesions noted  Chest - effort normal, all lung fields clear to auscultation bilaterally  Heart - normal rate and regular rhythm  Neck:  midline trachea, no thyromegaly or nodules  Breasts - breasts appear normal, no suspicious masses, no  skin or nipple changes or  axillary nodes  Abdomen - soft, nontender, nondistended, no masses or organomegaly  Pelvic - VULVA: normal appearing vulva with no masses, tenderness or lesions   VAGINA: normal appearing vagina with normal color and discharge, no lesions   CERVIX: normal appearing cervix without discharge or lesions, no CMT  Thin prep pap is not indicated  UTERUS: uterus is felt to be normal size, shape, consistency and nontender   ADNEXA: No adnexal masses or tenderness noted.  Rectal - normal rectal, good sphincter tone, no masses felt.   Extremities:  No swelling or varicosities noted  Chaperone present for exam  No results found for this or any previous visit (from the past 24 hours).  Assessment & Plan:  1. Well woman exam with routine gynecological exam (Primary) - Pap smear 12/24/2021 neg with neg HR HPV - Mammogram 11/26/2023 - Colonoscopy 04/19/2024, follow up 7 years - lab work  done with PCP, Ginger Patrick - vaccines reviewed/updated  2. Amenorrhea - will check FSH.  If appears she is not menopausal, will continue progesterone - Follicle stimulating hormone  3. Vitamin D  deficiency - VITAMIN D  25 Hydroxy (Vit-D Deficiency, Fractures)  4. Psoriasis   Meds: No orders of the defined types were placed in this encounter.   Follow-up: Return in about 1 year (around 06/13/2025).  Ronal GORMAN Pinal, MD 06/16/2024 2:07 PM

## 2024-06-14 LAB — FOLLICLE STIMULATING HORMONE: FSH: 2.8 m[IU]/mL

## 2024-06-14 LAB — VITAMIN D 25 HYDROXY (VIT D DEFICIENCY, FRACTURES): Vit D, 25-Hydroxy: 41 ng/mL (ref 30.0–100.0)

## 2024-06-15 ENCOUNTER — Ambulatory Visit (HOSPITAL_BASED_OUTPATIENT_CLINIC_OR_DEPARTMENT_OTHER): Payer: Self-pay | Admitting: Obstetrics & Gynecology

## 2024-06-15 DIAGNOSIS — N920 Excessive and frequent menstruation with regular cycle: Secondary | ICD-10-CM

## 2024-06-15 MED ORDER — NORETHINDRONE ACETATE 5 MG PO TABS: 2.5000 mg | ORAL_TABLET | Freq: Every day | ORAL | 3 refills | Status: AC

## 2024-06-16 ENCOUNTER — Encounter (HOSPITAL_BASED_OUTPATIENT_CLINIC_OR_DEPARTMENT_OTHER): Payer: Self-pay | Admitting: Obstetrics & Gynecology
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
## 2024-06-21 ENCOUNTER — Telehealth: Payer: Self-pay

## 2024-06-21 ENCOUNTER — Ambulatory Visit: Attending: Internal Medicine | Admitting: Internal Medicine

## 2024-06-21 ENCOUNTER — Encounter: Payer: Self-pay | Admitting: Internal Medicine

## 2024-06-21 VITALS — BP 113/75 | HR 85 | Resp 15 | Ht 71.0 in | Wt 294.0 lb

## 2024-06-21 DIAGNOSIS — L409 Psoriasis, unspecified: Secondary | ICD-10-CM

## 2024-06-21 DIAGNOSIS — L4050 Arthropathic psoriasis, unspecified: Secondary | ICD-10-CM

## 2024-06-21 DIAGNOSIS — Z79899 Other long term (current) drug therapy: Secondary | ICD-10-CM

## 2024-06-21 DIAGNOSIS — M1990 Unspecified osteoarthritis, unspecified site: Secondary | ICD-10-CM | POA: Diagnosis not present

## 2024-06-21 NOTE — Telephone Encounter (Signed)
 Patient changing to Enbrel. Closing encounter

## 2024-06-21 NOTE — Progress Notes (Unsigned)
 Pharmacy Note  Subjective: Patient presents today to the [facility] for follow up office visit.  Patient seen by the pharmacist for counseling on Enbrel for Psoriatic arthritis. Previously on Taltz  (no response), Tremfya (waning response), and methotrexate  Diagnosis of heart failure: No  Objective:  CBC    Component Value Date/Time   WBC 10.6 03/27/2024 0830   RBC 5.21 (H) 03/27/2024 0830   HGB 14.5 03/27/2024 0830   HGB 14.2 03/01/2024 1019   HGB 13.2 10/03/2015 0920   HGB 12.7 11/19/2014 1255   HCT 44.6 03/27/2024 0830   HCT 44.2 03/01/2024 1019   HCT 38.5 11/19/2014 1255   PLT 358 03/27/2024 0830   PLT 308 03/01/2024 1019   MCV 85.6 03/27/2024 0830   MCV 85 03/01/2024 1019   MCV 83.7 11/19/2014 1255   MCH 27.8 03/27/2024 0830   MCHC 32.5 03/27/2024 0830   RDW 12.4 03/27/2024 0830   RDW 12.2 03/01/2024 1019   RDW 13.0 11/19/2014 1255   LYMPHSABS 1.6 03/01/2024 1019   LYMPHSABS 1.7 11/19/2014 1255   MONOABS 0.7 04/09/2022 0836   MONOABS 0.6 11/19/2014 1255   EOSABS 233 03/27/2024 0830   EOSABS 0.2 03/01/2024 1019   BASOSABS 85 03/27/2024 0830   BASOSABS 0.1 03/01/2024 1019   BASOSABS 0.0 11/19/2014 1255     CMP     Component Value Date/Time   NA 137 03/27/2024 0830   NA 140 03/01/2024 1019   NA 140 11/19/2014 1255   K 4.6 03/27/2024 0830   K 3.8 11/19/2014 1255   CL 104 03/27/2024 0830   CO2 25 03/27/2024 0830   CO2 24 11/19/2014 1255   GLUCOSE 109 (H) 03/27/2024 0830   GLUCOSE 108 11/19/2014 1255   BUN 10 03/27/2024 0830   BUN 13 03/01/2024 1019   BUN 7.7 11/19/2014 1255   CREATININE 0.59 03/27/2024 0830   CREATININE 0.7 11/19/2014 1255   CALCIUM  9.9 03/27/2024 0830   CALCIUM  8.9 11/19/2014 1255   PROT 7.0 03/27/2024 0830   PROT 6.7 03/01/2024 1019   PROT 6.9 11/19/2014 1255   ALBUMIN 4.2 03/01/2024 1019   ALBUMIN 3.7 11/19/2014 1255   AST 16 03/27/2024 0830   AST 20 11/19/2014 1255   ALT 17 03/27/2024 0830   ALT 18 11/19/2014  1255   ALKPHOS 75 03/01/2024 1019   ALKPHOS 58 11/19/2014 1255   BILITOT 0.5 03/27/2024 0830   BILITOT 0.3 03/01/2024 1019   BILITOT 0.43 11/19/2014 1255   GFRNONAA >60 07/14/2020 0555   GFRAA >60 07/14/2020 0555     Baseline Immunosuppressant Therapy Labs TB GOLD    Latest Ref Rng & Units 03/27/2024    8:30 AM  Quantiferon TB Gold  Quantiferon TB Gold Plus NEGATIVE NEGATIVE    Hepatitis Panel   HIV No results found for: HIV Immunoglobulins   SPEP    Latest Ref Rng & Units 03/27/2024    8:30 AM  Serum Protein Electrophoresis  Total Protein 6.1 - 8.1 g/dL 7.0    H3EI No results found for: G6PDH TPMT No results found for: TPMT   Chest x-ray: Normal chest radiographs.   Assessment/Plan:  Counseled patient that Enbrel is a TNF blocking agent.  Counseled patient on purpose, proper use, and adverse effects of Enbrel.  Reviewed the most common adverse effects including infections, headache, and injection site reactions.  Discussed that there is the possibility of an increased risk of malignancy including non-melanoma skin cancer but it is not well understood if  this increased risk is due to the medication or the disease state.  Advised patient to get yearly dermatology exams due to risk of skin cancer.  Counseled patient that Enbrel should be held prior to scheduled surgery.  Counseled patient to avoid live vaccines while on Enbrel.  Recommend annual influenza, PCV 15 or PCV20 or Pneumovax 23, and Shingrix  as indicated.  Reviewed the importance of regular labs while on Enbrel therapy.  Will monitor CBC and CMP 1 month after starting and then every 3 months routinely thereafter. Will monitor TB gold annually. Standing orders placed. Provided patient with medication education material and answered all questions.  Patient consented to Enbrel.  Will upload consent into the media tab.  Reviewed storage instructions for Enbrel.  Advised initial injection must be administered in  office.

## 2024-06-21 NOTE — Patient Instructions (Signed)
Etanercept Injection What is this medication? ETANERCEPT (et a [employer]) treats autoimmune conditions, such as psoriasis and certain types of arthritis. It works by slowing down an overactive immune system. It belongs to a group of medications called TNF inhibitors. This medicine may be used for other purposes; ask your health care provider or pharmacist if you have questions. COMMON BRAND NAME(S): Enbrel What should I tell my care team before I take this medication? They need to know if you have any of these conditions: Bleeding disorder Cancer Diabetes Granulomatosis with polyangiitis Heart failure HIV or AIDS Immune system problems Infection, such as tuberculosis (TB) or other bacterial, fungal or viral infections Liver disease Nervous system problems, such as Guillain-Barre syndrome, multiple sclerosis or seizures Recent or upcoming vaccine An unusual or allergic reaction to etanercept, other medications, food, dyes, or preservatives Pregnant or trying to get pregnant Breastfeeding How should I use this medication? The medication is injected under the skin. You will be taught how to prepare and give it. Take it as directed on the prescription label. Keep taking it unless your care team tells you stop. This medication comes with INSTRUCTIONS FOR USE. Ask your pharmacist for directions on how to use this medication. Read the information carefully. Talk to your pharmacist or care team if you have questions. If you use a pen, be sure to take off the outer needle cover before using the dose. It is important that you put your used needles and syringes in a special sharps container. Do not put them in a trash can. If you do not have a sharps container, call your pharmacist or care team to get one. A special MedGuide will be given to you by the pharmacist with each prescription and refill. Be sure to read this information carefully each time. Talk to your care team about the use of this  medication in children. While it may be prescribed for children as young as 84 years of age for selected conditions, precautions do apply. Overdosage: If you think you have taken too much of this medicine contact a poison control center or emergency room at once. NOTE: This medicine is only for you. Do not share this medicine with others. What if I miss a dose? If you miss a dose, take it as soon as you can. If it is almost time for your next dose, take only that dose. Do not take double or extra doses. What may interact with this medication? Do not take this medication with any of the following: Biologic medications, such as adalimumab, certolizumab, golimumab, infliximab Live vaccines Rilonacept This medication may also interact with the following: Abatacept [place] Biologic medications, such as anifrolumab, baricitinib, belimumab, canakinumab, natalizumab, rituximab, sarilumab, tocilizumab, tofacitinib, upadacitinib, vedolizumab Cyclophosphamide Sulfasalazine This list may not describe all possible interactions. Give your health care provider a list of all the medicines, herbs, non-prescription drugs, or dietary supplements you use. Also tell them if you smoke, drink alcohol, or use illegal drugs. Some items may interact with your medicine. What should I watch for while using this medication? Visit your care team for regular checks on your progress. Tell your care team if your symptoms do not start to get better or if they get worse. This medication may increase your risk of getting an infection. Call your care team for advice if you get a fever, chills, sore throat, or other symptoms of a cold or flu. Do not treat yourself. Try to avoid being around people who are sick. If  you have not had the measles or chickenpox vaccines, tell your care team right away if you are around someone with these viruses. You will be tested for tuberculosis (TB) before you start this medication. If your care team  prescribes any medication for TB, you should start taking the TB medication before starting this medication. Make sure to finish the full course of TB medication. Avoid taking medications that contain aspirin, acetaminophen, ibuprofen, naproxen, or ketoprofen unless instructed by your care team. These medications may hide fever. Talk to your care team about your risk of cancer. You may be more at risk for certain types of cancer if you take this medication. This medication can decrease the response to a vaccine. If you need to get vaccinated, tell your care team if you have received this medication. Extra booster doses may be needed. Talk to your care team to see if a different vaccination schedule is needed. What side effects may I notice from receiving this medication? Side effects that you should report to your care team as soon as possible: Allergic reactions--skin rash, itching, hives, swelling of the face, lips, tongue, or throat Body pain, tingling, or numbness Eye pain, change in vision, vision loss Heart failure--shortness of breath, swelling of the ankles, feet, or hands, sudden weight gain, unusual weakness or fatigue Infection--fever, chills, cough, sore throat, wounds that don't heal, pain or trouble when passing urine, general feeling of discomfort or being unwell Liver injury--right upper belly pain, loss of appetite, nausea, light-colored stool, dark yellow or brown urine, yellowing skin or eyes, unusual weakness or fatigue Low red blood cell level--unusual weakness or fatigue, dizziness, headache, trouble breathing Lupus-like syndrome--joint pain, swelling, or stiffness, butterfly-shaped rash on the face, rashes that get worse in the sun, fever, unusual weakness or fatigue New or worsening psoriasis--rash with itchy, scaly patches Seizures Unusual bruising or bleeding Weakness in arms and legs Side effects that usually do not require medical attention (report to your care team if  they continue or are bothersome): Headache Pain, redness, or irritation at injection site Sinus pain or pressure around the face or forehead This list may not describe all possible side effects. Call your doctor for medical advice about side effects. You may report side effects to FDA at 1-800-FDA-1088. Where should I keep my medication? Keep out of the reach of children and pets. See product for storage information. Each product may have different instructions. Get rid of any unused medication after the expiration date. To get rid of medications that are no longer needed or have expired: Take the medication to a medication take-back program. Check with your pharmacy or law enforcement to find a location. If you cannot return the medication, ask your pharmacist or care team how to get rid of this medication safely. NOTE: This sheet is a summary. It may not cover all possible information. If you have questions about this medicine, talk to your doctor, pharmacist, or health care provider.  2024 Elsevier/Gold Standard (2022-05-20 00:00:00)

## 2024-06-21 NOTE — Telephone Encounter (Signed)
 Sandy [name], RPH-CPP  P Rx Rheum/Pulm Patient will be Enbrel new start for PsA/PsO Failed Taltz  (last dose was 05/29/24), prior to that Tremfya, and prior to that methotrexate Please utilize previous BIV encounters regarding process for approval through her PPO   Attempted to submit a PA via the [employer] portal, however request could not be completed at this time. Will wait to try again later since the OV note has not yet been completed. Will need to call phone number if issues with portal have not resolved by the time the note has been signed.

## 2024-06-22 ENCOUNTER — Ambulatory Visit: Payer: Self-pay | Admitting: Internal Medicine

## 2024-06-22 LAB — CBC WITH DIFFERENTIAL/PLATELET
Absolute Lymphocytes: 1456 {cells}/uL (ref 850–3900)
Absolute Monocytes: 735 {cells}/uL (ref 200–950)
Basophils Absolute: 49 {cells}/uL (ref 0–200)
Basophils Relative: 0.7 %
Eosinophils Absolute: 168 {cells}/uL (ref 15–500)
Eosinophils Relative: 2.4 %
HCT: 44.2 % (ref 35.0–45.0)
Hemoglobin: 13.8 g/dL (ref 11.7–15.5)
MCH: 27.7 pg (ref 27.0–33.0)
MCHC: 31.2 g/dL — ABNORMAL LOW (ref 32.0–36.0)
MCV: 88.6 fL (ref 80.0–100.0)
MPV: 10 fL (ref 7.5–12.5)
Monocytes Relative: 10.5 %
Neutro Abs: 4592 {cells}/uL (ref 1500–7800)
Neutrophils Relative %: 65.6 %
Platelets: 327 Thousand/uL (ref 140–400)
RBC: 4.99 Million/uL (ref 3.80–5.10)
RDW: 12.2 % (ref 11.0–15.0)
Total Lymphocyte: 20.8 %
WBC: 7 Thousand/uL (ref 3.8–10.8)

## 2024-06-22 LAB — COMPREHENSIVE METABOLIC PANEL WITH GFR
AG Ratio: 1.5 (calc) (ref 1.0–2.5)
ALT: 18 U/L (ref 6–29)
AST: 18 U/L (ref 10–35)
Albumin: 4.1 g/dL (ref 3.6–5.1)
Alkaline phosphatase (APISO): 69 U/L (ref 37–153)
BUN: 15 mg/dL (ref 7–25)
CO2: 24 mmol/L (ref 20–32)
Calcium: 10.1 mg/dL (ref 8.6–10.4)
Chloride: 107 mmol/L (ref 98–110)
Creat: 0.53 mg/dL (ref 0.50–1.03)
Globulin: 2.8 g/dL (ref 1.9–3.7)
Glucose, Bld: 102 mg/dL — ABNORMAL HIGH (ref 65–99)
Potassium: 4.6 mmol/L (ref 3.5–5.3)
Sodium: 139 mmol/L (ref 135–146)
Total Bilirubin: 0.5 mg/dL (ref 0.2–1.2)
Total Protein: 6.9 g/dL (ref 6.1–8.1)
eGFR: 112 mL/min/1.73m2 (ref >=60)

## 2024-06-22 LAB — C-REACTIVE PROTEIN: CRP: 5.1 mg/L (ref <8.0)

## 2024-06-22 MED ORDER — TRIAMCINOLONE ACETONIDE 0.1 % EX CREA: 1.0000 | TOPICAL_CREAM | Freq: Two times a day (BID) | CUTANEOUS | 0 refills | Status: AC | PRN

## 2024-06-22 MED ORDER — BETAMETHASONE DIPROPIONATE AUG 0.05 % EX OINT: TOPICAL_OINTMENT | Freq: Two times a day (BID) | CUTANEOUS | 0 refills | Status: AC | PRN

## 2024-06-22 MED ORDER — CELECOXIB 100 MG PO CAPS: ORAL_CAPSULE | ORAL | 2 refills | Status: DC

## 2024-06-22 NOTE — Progress Notes (Signed)
 CRP of 5.1 remains normal improved compared to 17 before starting the Taltz  despite the lack of joint improvement.  Blood counts and kidney and liver function test are normal.  I see no problem continuing the Celebrex  and do not see any problem with going ahead changing to the Enbrel as planned.

## 2024-06-28 NOTE — Telephone Encounter (Signed)
 Submitted a PA for Enbrel [employer] via the [employer] portal along with supporting chart notes and most recent TB test results. Will await determination.  EOC ID: [id number]

## 2024-07-04 ENCOUNTER — Other Ambulatory Visit (HOSPITAL_COMMUNITY): Payer: Self-pay

## 2024-07-04 MED ORDER — ENBREL SURECLICK 50 MG/ML ~~LOC~~ SOAJ
50.0000 mg | SUBCUTANEOUS | 0 refills | Status: DC
Start: 2024-07-04 — End: 2024-09-25

## 2024-07-04 NOTE — Telephone Encounter (Signed)
 Received notification from [facility] regarding a prior authorization for ENBREL . Authorization has been APPROVED from 06/29/2024 to 12/30/2024. Approval letter sent to scan center.  Patient must fill through [facility] (but previously an override has to be placed for patients to fill with external pharmacy)    Authorization # [id number]  Email sent to above contacts for override  Sherry Pennant, PharmD, MPH, BCPS, CPP Clinical Pharmacist (Rheumatology and Pulmonology)

## 2024-07-06 NOTE — Telephone Encounter (Signed)
 Per rep at [facility], override is in place. Pharmacy is running incorrect days supply  It looks like the issue is the attempted claim is being ran for #12/28DS, but the clinical prior authorization is only approved for #4/28DS. If the quantity is updated accordingly the claim should pay through.  Phone: [phone]  Rep able to successfully process claim for 28 days supply. Patient notified via MyChart. They were able to get $0 with copay card added in   Sherry Pennant, PharmD, MPH, BCPS, CPP Clinical Pharmacist (Rheumatology and Pulmonology)

## 2024-08-07 DIAGNOSIS — G4730 Sleep apnea, unspecified: Secondary | ICD-10-CM

## 2024-08-07 HISTORY — DX: Sleep apnea, unspecified: G47.30

## 2024-08-18 ENCOUNTER — Ambulatory Visit: Admitting: Family

## 2024-08-18 ENCOUNTER — Encounter: Payer: Self-pay | Admitting: Family

## 2024-08-18 VITALS — BP 126/84 | HR 73 | Temp 98.4°F | Ht 70.75 in | Wt 293.4 lb

## 2024-08-18 DIAGNOSIS — Z23 Encounter for immunization: Secondary | ICD-10-CM

## 2024-08-18 DIAGNOSIS — E782 Mixed hyperlipidemia: Secondary | ICD-10-CM | POA: Diagnosis not present

## 2024-08-18 DIAGNOSIS — M542 Cervicalgia: Secondary | ICD-10-CM | POA: Diagnosis not present

## 2024-08-18 DIAGNOSIS — M62838 Other muscle spasm: Secondary | ICD-10-CM

## 2024-08-18 DIAGNOSIS — R0609 Other forms of dyspnea: Secondary | ICD-10-CM

## 2024-08-18 DIAGNOSIS — N898 Other specified noninflammatory disorders of vagina: Secondary | ICD-10-CM

## 2024-08-18 DIAGNOSIS — K921 Melena: Secondary | ICD-10-CM

## 2024-08-18 DIAGNOSIS — E559 Vitamin D deficiency, unspecified: Secondary | ICD-10-CM

## 2024-08-18 DIAGNOSIS — Z13220 Encounter for screening for lipoid disorders: Secondary | ICD-10-CM

## 2024-08-18 DIAGNOSIS — G4733 Obstructive sleep apnea (adult) (pediatric): Secondary | ICD-10-CM

## 2024-08-18 DIAGNOSIS — R0789 Other chest pain: Secondary | ICD-10-CM

## 2024-08-18 DIAGNOSIS — B009 Herpesviral infection, unspecified: Secondary | ICD-10-CM

## 2024-08-18 DIAGNOSIS — D500 Iron deficiency anemia secondary to blood loss (chronic): Secondary | ICD-10-CM

## 2024-08-18 DIAGNOSIS — G8929 Other chronic pain: Secondary | ICD-10-CM

## 2024-08-18 DIAGNOSIS — R7303 Prediabetes: Secondary | ICD-10-CM

## 2024-08-18 LAB — IBC + FERRITIN
Ferritin: 33 ng/mL (ref 10.0–291.0)
Iron: 86 ug/dL (ref 42–145)
Saturation Ratios: 23 % (ref 20.0–50.0)
TIBC: 373.8 ug/dL (ref 250.0–450.0)
Transferrin: 267 mg/dL (ref 212.0–360.0)

## 2024-08-18 LAB — LIPID PANEL
Cholesterol: 150 mg/dL (ref 0–200)
HDL: 39.2 mg/dL (ref >39.00)
LDL Cholesterol: 97 mg/dL (ref 0–99)
NonHDL: 110.99
Total CHOL/HDL Ratio: 4
Triglycerides: 72 mg/dL (ref 0.0–149.0)
VLDL: 14.4 mg/dL (ref 0.0–40.0)

## 2024-08-18 LAB — CBC
HCT: 41.7 % (ref 36.0–46.0)
Hemoglobin: 13.8 g/dL (ref 12.0–15.0)
MCHC: 33.1 g/dL (ref 30.0–36.0)
MCV: 85.3 fl (ref 78.0–100.0)
Platelets: 278 K/uL (ref 150.0–400.0)
RBC: 4.89 Mil/uL (ref 3.87–5.11)
RDW: 13.2 % (ref 11.5–15.5)
WBC: 5.7 K/uL (ref 4.0–10.5)

## 2024-08-18 LAB — VITAMIN D 25 HYDROXY (VIT D DEFICIENCY, FRACTURES): VITD: 36.97 ng/mL (ref 30.00–100.00)

## 2024-08-18 LAB — HEMOGLOBIN A1C: Hgb A1c MFr Bld: 6.5 % (ref 4.6–6.5)

## 2024-08-18 MED ORDER — ALBUTEROL SULFATE HFA 108 (90 BASE) MCG/ACT IN AERS: 2.0000 | INHALATION_SPRAY | RESPIRATORY_TRACT | 1 refills | Status: AC | PRN

## 2024-08-18 MED ORDER — OMEPRAZOLE 20 MG PO CPDR: 20.0000 mg | DELAYED_RELEASE_CAPSULE | Freq: Every day | ORAL | 0 refills | Status: AC

## 2024-08-18 MED ORDER — VALACYCLOVIR HCL 500 MG PO TABS: 500.0000 mg | ORAL_TABLET | Freq: Every day | ORAL | Status: AC | PRN

## 2024-08-18 MED ORDER — GABAPENTIN 100 MG PO CAPS: ORAL_CAPSULE | ORAL | Status: DC

## 2024-08-18 MED ORDER — CYCLOBENZAPRINE HCL 10 MG PO TABS: ORAL_TABLET | ORAL | Status: DC

## 2024-08-18 NOTE — Progress Notes (Signed)
 Established Patient Office Visit  Subjective:      CC:  Chief Complaint  Patient presents with   Medical Management of Chronic Issues    HPI: Sandy [name] is a 53 y.o. female presenting on 08/18/2024 for Medical Management of Chronic Issues .  Discussed the use of AI scribe software for clinical note transcription with the patient, who gave verbal consent to proceed.  History of Present Illness Sandy [name] is a 53 year old female with psoriasis and sleep apnea who presents with concerns about her CPAP costs and psoriasis management.  She is experiencing financial difficulties with her CPAP machine, which costs $800 initially and $75 monthly. She reports persistent fatigue and apneic episodes despite the cost of the CPAP.  She has a history of psoriasis and has recently switched to Enbrel  after previous treatments with Tremfya and Taltz . Taltz  exacerbated her psoriasis, causing it to become 'rampant.' She has completed four doses of Enbrel , which is administered weekly. Despite treatment, she continues to experience significant psoriasis symptoms, impacting her stress and anxiety levels.  She has been prescribed gabapentin  by her gynecologist to manage menopause symptoms but has not started it due to concerns about potential side effects. She previously tried gabapentin  for neck pain and fatigue but is not currently taking it.  She reports a recent episode of rectal bleeding, which occurred in two consecutive bowel movements approximately two weeks ago. The blood was bright red and present in the stool and toilet bowl. She had a colonoscopy in May, which revealed polyps and diverticula, but the bleeding has not recurred since. No burning or pain at the rectal site during the recent bleeding episode.  She experiences respiratory symptoms, including a persistent cough and exertional dyspnea, which she attributes to possible long-term effects of COVID-19. She uses an inhaler  occasionally, which provides some relief. She also experiences chest tightness and occasional heartburn, which she manages with Flonase and Xyzal intermittently.  Her medication regimen includes atorvastatin  for cholesterol management and over-the-counter vitamin D . She has missed some doses of her medications recently due to a disrupted schedule after traveling.  She has experienced stress due to work restructuring and the recent passing of a cousin in hospice.  Vaginal discharge x 2 weeks white green discharge.  Denies STD not sexually active       Social history:  Relevant past medical, surgical, family and social history reviewed and updated as indicated. Interim medical history since our last visit reviewed.  Allergies and medications reviewed and updated.  DATA REVIEWED: CHART IN EPIC     ROS: Negative unless specifically indicated above in HPI.    Current Outpatient Medications:    atorvastatin  (LIPITOR) 10 MG tablet, TAKE 1 TABLET BY MOUTH EVERY DAY, Disp: 30 tablet, Rfl: 5   augmented betamethasone  dipropionate (DIPROLENE -AF) 0.05 % ointment, Apply topically 2 (two) times daily as needed. For palms and soles, Disp: 15 g, Rfl: 0   celecoxib  (CELEBREX ) 100 MG capsule, Take 1 capsule by mouth as needed up to every 12 hours as needed for joint pain, Disp: 60 capsule, Rfl: 2   Cholecalciferol  1.25 MG (50000 UT) TABS, Take 1 tablet by mouth once a week., Disp: 12 tablet, Rfl: 0   DULoxetine  (CYMBALTA ) 30 MG capsule, TAKE 1 CAPSULE BY MOUTH EVERY DAY, Disp: 30 capsule, Rfl: 5   etanercept  (ENBREL  SURECLICK) 50 MG/ML injection, Inject 50 mg into the skin once a week. **DISCONTINUE TALTZ **, Disp: 12 mL, Rfl: 0  hydrOXYzine  (VISTARIL ) 25 MG capsule, Take 1 capsule (25 mg total) by mouth every 8 (eight) hours as needed., Disp: 30 capsule, Rfl: 0   metroNIDAZOLE (METROCREAM) 0.75 % cream, Apply topically 2 (two) times daily., Disp: , Rfl:    Multiple Vitamin (MULTIVITAMIN PO), Take  by mouth., Disp: , Rfl:    norethindrone  (AYGESTIN ) 5 MG tablet, Take 0.5 tablets (2.5 mg total) by mouth daily., Disp: 90 tablet, Rfl: 3   omeprazole  (PRILOSEC) 20 MG capsule, Take 1 capsule (20 mg total) by mouth daily., Disp: 30 capsule, Rfl: 0   rizatriptan  (MAXALT -MLT) 5 MG disintegrating tablet, Take 1 tablet (5 mg total) by mouth as needed for migraine. May repeat in 2 hours if needed, Disp: 10 tablet, Rfl: 11   SUMAtriptan  (IMITREX ) 100 MG tablet, Take 1 tablet (100 mg total) by mouth every 2 (two) hours as needed for migraine. May repeat in 2 hours if headache persists or recurs., Disp: 9 tablet, Rfl: 1   traZODone  (DESYREL ) 50 MG tablet, TAKE 1 TABLET (50 MG TOTAL) BY MOUTH AT BEDTIME AS NEEDED. FOR SLEEP, Disp: 90 tablet, Rfl: 3   triamcinolone  cream (KENALOG ) 0.1 %, Apply 1 Application topically 2 (two) times daily as needed. For body, Disp: 454 g, Rfl: 0   ZORYVE 0.3 % CREA, Apply topically., Disp: , Rfl:    albuterol  (VENTOLIN  HFA) 108 (90 Base) MCG/ACT inhaler, Inhale 2 puffs into the lungs every 4 (four) hours as needed for wheezing., Disp: 1 each, Rfl: 1   cyclobenzaprine  (FLEXERIL ) 10 MG tablet, Take 1/2 to one tablet po qhs prn muscle spasm, Disp: , Rfl:    gabapentin  (NEURONTIN ) 100 MG capsule, Take one po at bedtime at night time can increase by 100 mg every three days if tolerating with goal of 300 mg at nightime, Disp: , Rfl:    valACYclovir  (VALTREX ) 500 MG tablet, Take 1 tablet (500 mg total) by mouth daily as needed. Increase to bid x 3 days with any symptoms., Disp: , Rfl:         Objective:        BP 126/84 (BP Location: Left Arm, Patient Position: Sitting, Cuff Size: Large)   Pulse 73   Temp 98.4 F (36.9 C) (Temporal)   Ht 5' 10.75 (1.797 m)   Wt 293 lb 6.4 oz (133.1 kg)   SpO2 98%   BMI 41.21 kg/m   Physical Exam CHEST: Mild tenderness in the lower chest.  Wt Readings from Last 3 Encounters:  08/18/24 293 lb 6.4 oz (133.1 kg)  06/21/24 294 lb  (133.4 kg)  06/13/24 292 lb 6.4 oz (132.6 kg)    Physical Exam Vitals reviewed.  Constitutional:      General: She is not in acute distress.    Appearance: Normal appearance. She is obese. She is not ill-appearing, toxic-appearing or diaphoretic.  HENT:     Head: Normocephalic.  Cardiovascular:     Rate and Rhythm: Normal rate and regular rhythm.  Pulmonary:     Effort: Pulmonary effort is normal.     Breath sounds: Normal breath sounds.  Musculoskeletal:        General: Normal range of motion.  Neurological:     General: No focal deficit present.     Mental Status: She is alert and oriented to person, place, and time. Mental status is at baseline.  Psychiatric:        Mood and Affect: Mood normal.        Behavior: Behavior normal.  Thought Content: Thought content normal.        Judgment: Judgment normal.          Results DIAGNOSTIC Colonoscopy: 6 mm polyp in transverse colon, 3 mm polyp in descending colon, multiple diverticula in sigmoid colon (04/19/2024)  Assessment & Plan:   Assessment and Plan Assessment & Plan Dyspnea and exertional chest tightness Exertional dyspnea and chest tightness possibly related to post-COVID effects, asthma, or GERD. Symptoms include cough and shortness of breath with exertion. Potential for reactive airway disease and post-COVID respiratory effects discussed. - Refer to pulmonary specialist for pulmonary function tests - Prescribe Prilosec daily for 2 weeks to assess for GERD-related symptoms - Send prescription for updated inhaler to pharmacy  Severe obstructive sleep apnea Severe obstructive sleep apnea with ongoing symptoms of fatigue and apneic episodes. Financial burden of CPAP therapy discussed. - Discuss installment plan options for CPAP with pulmonary specialist  Rectal bleeding Intermittent rectal bleeding with bright red blood in stool and toilet bowl over two consecutive bowel movements, not associated with pain or  burning. Potential for hemorrhoids or other GI issues discussed. - Order fecal occult blood test - Advise to report any recurrence of significant bleeding immediately  Iron deficiency anemia secondary to blood loss Potential concern for anemia due to recent rectal bleeding. - Order labs to check for anemia  Psoriasis Psoriasis exacerbated by previous treatment with Taltz , now switched to Enbrel  with four doses administered. Psoriasis remains severe and widespread. Reports anxiety and stress related to the condition.  Cervicalgia and muscle spasm Muscle tenderness in the chest, likely muscular in origin. - Apply heat to affected area  Mixed hyperlipidemia Mixed hyperlipidemia managed with atorvastatin . Last cholesterol check in March. - Order labs to recheck cholesterol levels  Prediabetes Prediabetes with previous A1c nearing diabetic range. - Order labs to recheck A1c  Recording duration: 19 minutes      Return in about 6 months (around 02/15/2025) for f/u CPE.     Ginger Patrick, MSN, APRN, FNP-C [facility]

## 2024-08-21 ENCOUNTER — Ambulatory Visit: Payer: Self-pay | Admitting: Family

## 2024-08-21 DIAGNOSIS — B9689 Other specified bacterial agents as the cause of diseases classified elsewhere: Secondary | ICD-10-CM

## 2024-08-21 DIAGNOSIS — E119 Type 2 diabetes mellitus without complications: Secondary | ICD-10-CM | POA: Insufficient documentation

## 2024-08-21 LAB — WET PREP BY MOLECULAR PROBE
Candida species: NOT DETECTED
MICRO NUMBER:: 16961043
SPECIMEN QUALITY:: ADEQUATE
Trichomonas vaginosis: NOT DETECTED

## 2024-08-21 MED ORDER — METRONIDAZOLE 500 MG PO TABS: 500.0000 mg | ORAL_TABLET | Freq: Two times a day (BID) | ORAL | 0 refills | Status: AC

## 2024-08-23 ENCOUNTER — Ambulatory Visit
Admission: RE | Admit: 2024-08-23 | Discharge: 2024-08-23 | Disposition: A | Discharge status: Home / Self Care | Source: Ambulatory Visit | Attending: Internal Medicine | Admitting: Internal Medicine

## 2024-08-23 ENCOUNTER — Ambulatory Visit (INDEPENDENT_AMBULATORY_CARE_PROVIDER_SITE_OTHER): Admitting: Internal Medicine

## 2024-08-23 ENCOUNTER — Encounter: Payer: Self-pay | Admitting: Internal Medicine

## 2024-08-23 ENCOUNTER — Telehealth: Payer: Self-pay | Admitting: Family

## 2024-08-23 ENCOUNTER — Encounter: Payer: Self-pay | Admitting: Family

## 2024-08-23 VITALS — BP 110/70 | HR 84 | Temp 98.6°F | Ht 70.75 in | Wt 291.4 lb

## 2024-08-23 DIAGNOSIS — G4733 Obstructive sleep apnea (adult) (pediatric): Secondary | ICD-10-CM | POA: Diagnosis not present

## 2024-08-23 DIAGNOSIS — J45991 Cough variant asthma: Secondary | ICD-10-CM | POA: Diagnosis not present

## 2024-08-23 DIAGNOSIS — J4550 Severe persistent asthma, uncomplicated: Secondary | ICD-10-CM | POA: Insufficient documentation

## 2024-08-23 DIAGNOSIS — Z6841 Body Mass Index (BMI) 40.0 and over, adult: Secondary | ICD-10-CM

## 2024-08-23 DIAGNOSIS — U099 Post covid-19 condition, unspecified: Secondary | ICD-10-CM

## 2024-08-23 HISTORY — DX: Post covid-19 condition, unspecified: U09.9

## 2024-08-23 LAB — NITRIC OXIDE: Nitric Oxide: 10

## 2024-08-23 MED ORDER — BUDESONIDE-FORMOTEROL FUMARATE 160-4.5 MCG/ACT IN AERO: 2.0000 | INHALATION_SPRAY | Freq: Two times a day (BID) | RESPIRATORY_TRACT | 12 refills | Status: AC

## 2024-08-23 NOTE — Telephone Encounter (Signed)
 Spoke with pt and made her aware that her sleep study has been scanned into her chart and Dr. Jacqulyn office should be able to access this in her chart. She will rely this message to them.

## 2024-08-23 NOTE — Telephone Encounter (Signed)
 Copied from CRM [id number]. Topic: General - Other >> Aug 23, 2024  9:33 AM Thersia BROCKS wrote: Reason for CRM: Patient is in the office with Dr.Kasa at her Pulmonary office needs to share  sleep apena results

## 2024-08-23 NOTE — Patient Instructions (Signed)
 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

## 2024-08-23 NOTE — Progress Notes (Signed)
 [facility] Consultation      Date: 08/23/2024,   MRN# [id number] Asianna A [place] 10/29/1971    CHIEF COMPLAINT:   Assessment of chronic cough Assessment of sleep apnea   HISTORY OF PRESENT ILLNESS   Assessment of chronic cough Symptoms have been going on for over 3 years Intermittently with shortness of breath no chest pain involved Dyspnea on exertion at times She is very active outdoors Non-smoker nonalcoholic No specific triggers She works at [employer] Several years ago was diagnosed with COVID Then she had a recent diagnosis of COVID 1 year ago Patient went to urgent care was prescribed albuterol  inhaler which does help but she uses infrequently  No exacerbation at this time No evidence of heart failure at this time No evidence or signs of infection at this time No respiratory distress No fevers, chills, nausea, vomiting, diarrhea No evidence of lower extremity edema No evidence hemoptysis    Assessment of ASTHMA FeNO 10    ppb-Elevated exhaled Nitric oxide  testing is NOT highly consistent with type II inflammation  Patient is seen today for problems and issues with sleep related to excessive daytime sleepiness Patient  has been having sleep problems for many years Patient has been having excessive daytime sleepiness for a long time Patient has been having extreme fatigue and tiredness, lack of energy +  very Loud snoring every night + struggling breathe at night and gasps for air  Patient diagnosed with sleep apnea patient reports her O2 sat levels dropped into the 80s and her AHI is approximately 28 Her home sleep study was done 6 months ago and would set I have recommended that she get that report to us  No repeat sleep study at this time due to the fact that she had her sleep study 6 months ago  Patient has allergic rhinitis which is controlled by Flonase and Zyrtec  Patient currently weighs 291 pounds height 5 feet 10  inches Significantly obese recommend weight loss in the next several months    Discussed risk of untreated sleep apnea including cardiac arrhthymias, stroke, DM, pulm HTN.      08/23/2024    8:00 AM  Results of the Epworth flowsheet  Sitting and reading 3  Watching TV 3  Sitting, inactive in a public place (e.g. a theatre or a meeting) 0  As a passenger in a car for an hour without a break 0  Lying down to rest in the afternoon when circumstances permit 2  Sitting and talking to someone 1  Sitting quietly after a lunch without alcohol 0  In a car, while stopped for a few minutes in traffic 0  Total score 9     PAST MEDICAL HISTORY   Past Medical History:  Diagnosis Date   Abnormal Pap smear    H/O CIN II   Anemia    with iron infusions   Back pain    Costochondritis    Migraines    Psoriasis    STD (sexually transmitted disease)    HSV II     SURGICAL HISTORY   Past Surgical History:  Procedure Laterality Date   ADENOIDECTOMY     APPENDECTOMY  12/07/1990   53 y/o   CRYOTHERAPY  12/08/2007   of cx   DERMOID CYST  EXCISION  12/07/1990   left ? ovary/appendectomy   OVARIAN CYST REMOVAL  12/07/1998   urethral stretching  12/08/1991   WISDOM TOOTH EXTRACTION  FAMILY HISTORY   Family History  Problem Relation Age of Onset   Thyroid disease Mother        and maternal side   Hypertension Mother    Heart Problems Mother        heart arythmia   Breast cancer Mother 60       had negative genetic testing   Diabetes Father    Cirrhosis Father    Thrombocytopenia Sister        ITP   Heart attack Maternal Uncle    Colon cancer Paternal Aunt    Esophageal cancer Maternal Grandmother    Throat cancer Maternal Grandfather    Cervical cancer Paternal Grandmother    Colon polyps Neg Hx    Rectal cancer Neg Hx    Stomach cancer Neg Hx      SOCIAL HISTORY   Social History   Tobacco Use   Smoking status: Never    Passive exposure: Past    Smokeless tobacco: Never  Vaping Use   Vaping status: Never Used  Substance Use Topics   Alcohol use: Yes    Alcohol/week: 1.0 standard drink of alcohol    Types: 1 Standard drinks or equivalent per week   Drug use: No     MEDICATIONS    Home Medication:  Current Outpatient Rx   Order #: [id number] Class: Normal   Order #: [id number] Class: Normal   Order #: [id number] Class: Normal   Order #: [id number] Class: Normal   Order #: [id number] Class: Normal   Order #: [id number] Class: No Print   Order #: [id number] Class: Normal   Order #: [id number] Class: Normal   Order #: [id number] Class: No Print   Order #: [id number] Class: Normal   Order #: [id number] Class: Normal   Order #: [id number] Class: Historical Med   Order #: [id number] Class: Historical Med   Order #: [id number] Class: Normal   Order #: [id number] Class: Normal   Order #: [id number] Class: Normal   Order #: [id number] Class: Normal   Order #: [id number] Class: Normal   Order #: [id number] Class: Normal   Order #: [id number] Class: No Print   Order #: [id number] Class: Historical Med    Current Medication:  Current Outpatient Medications:    albuterol  (VENTOLIN  HFA) 108 (90 Base) MCG/ACT inhaler, Inhale 2 puffs into the lungs every 4 (four) hours as needed for wheezing., Disp: 1 each, Rfl: 1   atorvastatin  (LIPITOR) 10 MG tablet, TAKE 1 TABLET BY MOUTH EVERY DAY, Disp: 30 tablet, Rfl: 5   augmented betamethasone  dipropionate (DIPROLENE -AF) 0.05 % ointment, Apply topically 2 (two) times daily as needed. For palms and soles, Disp: 15 g, Rfl: 0   celecoxib  (CELEBREX ) 100 MG capsule, Take 1 capsule by mouth as needed up to every 12 hours as needed for joint pain, Disp: 60 capsule, Rfl: 2   Cholecalciferol  1.25 MG (50000 UT) TABS, Take 1 tablet by mouth once a week., Disp: 12 tablet, Rfl: 0   cyclobenzaprine  (FLEXERIL ) 10 MG tablet, Take 1/2 to one tablet po qhs prn muscle spasm, Disp: , Rfl:    DULoxetine  (CYMBALTA ) 30 MG capsule, TAKE 1 CAPSULE BY  MOUTH EVERY DAY, Disp: 30 capsule, Rfl: 5   etanercept  (ENBREL  SURECLICK) 50 MG/ML injection, Inject 50 mg into the skin once a week. **DISCONTINUE TALTZ **, Disp: 12 mL, Rfl: 0   gabapentin  (NEURONTIN ) 100 MG capsule, Take one po at bedtime at night time can increase by 100 mg every three days if tolerating with goal of 300 mg at nightime, Disp: , Rfl:    hydrOXYzine  (VISTARIL ) 25  MG capsule, Take 1 capsule (25 mg total) by mouth every 8 (eight) hours as needed., Disp: 30 capsule, Rfl: 0   metroNIDAZOLE  (FLAGYL ) 500 MG tablet, Take 1 tablet (500 mg total) by mouth 2 (two) times daily for 7 days., Disp: 14 tablet, Rfl: 0   metroNIDAZOLE  (METROCREAM ) 0.75 % cream, Apply topically 2 (two) times daily., Disp: , Rfl:    Multiple Vitamin (MULTIVITAMIN PO), Take by mouth., Disp: , Rfl:    norethindrone  (AYGESTIN ) 5 MG tablet, Take 0.5 tablets (2.5 mg total) by mouth daily., Disp: 90 tablet, Rfl: 3   omeprazole  (PRILOSEC) 20 MG capsule, Take 1 capsule (20 mg total) by mouth daily., Disp: 30 capsule, Rfl: 0   rizatriptan  (MAXALT -MLT) 5 MG disintegrating tablet, Take 1 tablet (5 mg total) by mouth as needed for migraine. May repeat in 2 hours if needed, Disp: 10 tablet, Rfl: 11   SUMAtriptan  (IMITREX ) 100 MG tablet, Take 1 tablet (100 mg total) by mouth every 2 (two) hours as needed for migraine. May repeat in 2 hours if headache persists or recurs., Disp: 9 tablet, Rfl: 1   traZODone  (DESYREL ) 50 MG tablet, TAKE 1 TABLET (50 MG TOTAL) BY MOUTH AT BEDTIME AS NEEDED. FOR SLEEP, Disp: 90 tablet, Rfl: 3   triamcinolone  cream (KENALOG ) 0.1 %, Apply 1 Application topically 2 (two) times daily as needed. For body, Disp: 454 g, Rfl: 0   valACYclovir  (VALTREX ) 500 MG tablet, Take 1 tablet (500 mg total) by mouth daily as needed. Increase to bid x 3 days with any symptoms., Disp: , Rfl:    ZORYVE 0.3 % CREA, Apply topically., Disp: , Rfl:     ALLERGIES   Codeine, Sulfa antibiotics, and Tramadol  BP 110/70    Pulse 84   Temp 98.6 F (37 C)   Ht 5' 10.75 (1.797 m)   Wt 291 lb 6.4 oz (132.2 kg)   SpO2 95%   BMI 40.93 kg/m    Review of Systems: Gen:  Denies  fever, sweats, chills weight loss  HEENT: Denies blurred vision, double vision, ear pain, eye pain, hearing loss, nose bleeds, sore throat Cardiac:  No dizziness, chest pain or heaviness, chest tightness,edema, No JVD Resp:   No cough, -sputum production, -shortness of breath,-wheezing, -hemoptysis,  Other:  All other systems negative   Physical Examination:   General Appearance: No distress  EYES PERRLA, EOM intact.   NECK Supple, No JVD Pulmonary: normal breath sounds, No wheezing.  CardiovascularNormal S1,S2.  No m/r/g.   Abdomen: Benign, Soft, non-tender. Neurology UE/LE 5/5 strength, no focal deficits Ext pulses intact, cap refill intact ALL OTHER ROS ARE NEGATIVE        ASSESSMENT/PLAN    53 year old morbidly obese white female with signs and symptoms of cough variant asthma due to previous history of COVID in the setting of morbid obesity along with on treated sleep apnea in the setting of deconditioned state  Cough variant asthma likely due to COVID and obesity Recommend chest x-ray Commend pulmonary function testing start Symbicort  inhaler 2 puffs in the morning 2 puffs at night Rinse mouth after use Avoid Allergens and Irritants Avoid secondhand smoke Avoid SICK contacts Recommend  Masking  when appropriate Recommend Keep up-to-date with vaccinations  Assessment of OSA Patient reports severe AHI 140 with 80% saturations Recommend therapy as soon as possible Auto CPAP 4-12 mask of choice Risks explained to patient in detail  Obesity Current weight 291 pounds at 5 feet 11 inches -recommend significant weight loss -  recommend changing diet  Deconditioned state -Recommend increased daily activity and exercise     MEDICATION ADJUSTMENTS/LABS AND TESTS ORDERED: starting Symbicort  inhaler 2 puffs in  the morning 2 puffs at night Please rinse mouth after every use Continue to use albuterol  as needed Obtain chest x-ray Obtain pulmonary function testing Avoid Allergens and Irritants Avoid secondhand smoke Avoid SICK contacts Recommend  Masking  when appropriate Recommend Keep up-to-date with vaccinations Regarding sleep apnea please obtain sleep study report Recommend weight loss Recommend 6 pounds weight loss in the next 2 months Started CPAP  CURRENT MEDICATIONS REVIEWED AT LENGTH WITH PATIENT TODAY   Patient  satisfied with Plan of action and management. All questions answered   Follow up 2 months   I spent a total of 62 minutes dedicated to the care of this patient on the date of this encounter to include pre-visit review of records, face-to-face time with the patient discussing conditions above, post visit ordering of testing, clinical documentation with the electronic health record, making appropriate referrals as documented, and communicating necessary information to the patient's healthcare team.    The Patient requires high complexity decision making for assessment and support, frequent evaluation and titration of therapies, application of advanced monitoring technologies and extensive interpretation of multiple databases.  Patient satisfied with Plan of action and management. All questions answered    Nickolas Alm Cellar, M.D.  [facility]  Medical Director [facility] Medical Director [facility] Cardio-Pulmonary Department

## 2024-08-23 NOTE — Addendum Note (Signed)
 Addended by: Zandrea Kenealy J on: 08/23/2024 03:51 PM   Modules accepted: Orders

## 2024-08-24 ENCOUNTER — Ambulatory Visit: Payer: Self-pay | Admitting: Internal Medicine

## 2024-08-24 ENCOUNTER — Other Ambulatory Visit (INDEPENDENT_AMBULATORY_CARE_PROVIDER_SITE_OTHER): Payer: Self-pay

## 2024-08-24 DIAGNOSIS — K921 Melena: Secondary | ICD-10-CM | POA: Diagnosis not present

## 2024-08-25 LAB — FECAL OCCULT BLOOD, IMMUNOCHEMICAL: Fecal Occult Bld: NEGATIVE

## 2024-08-30 ENCOUNTER — Encounter: Payer: Self-pay | Admitting: Family

## 2024-09-01 NOTE — Telephone Encounter (Signed)
 Needs appt likely will need pelvic exam.  Unless she has a GYN and would prefer to be seen with them for this.

## 2024-09-07 DIAGNOSIS — S82899A Other fracture of unspecified lower leg, initial encounter for closed fracture: Secondary | ICD-10-CM

## 2024-09-07 HISTORY — DX: Other fracture of unspecified lower leg, initial encounter for closed fracture: S82.899A

## 2024-09-13 ENCOUNTER — Ambulatory Visit (HOSPITAL_BASED_OUTPATIENT_CLINIC_OR_DEPARTMENT_OTHER): Admitting: Obstetrics & Gynecology

## 2024-09-13 ENCOUNTER — Other Ambulatory Visit (HOSPITAL_COMMUNITY)
Admission: RE | Admit: 2024-09-13 | Discharge: 2024-09-13 | Disposition: A | Discharge status: Home / Self Care | Source: Ambulatory Visit | Attending: Obstetrics & Gynecology | Admitting: Obstetrics & Gynecology

## 2024-09-13 ENCOUNTER — Encounter (HOSPITAL_BASED_OUTPATIENT_CLINIC_OR_DEPARTMENT_OTHER): Payer: Self-pay | Admitting: Obstetrics & Gynecology

## 2024-09-13 VITALS — BP 130/76 | HR 93 | Ht 71.0 in | Wt 294.0 lb

## 2024-09-13 DIAGNOSIS — N898 Other specified noninflammatory disorders of vagina: Secondary | ICD-10-CM | POA: Insufficient documentation

## 2024-09-13 MED ORDER — TERCONAZOLE 0.4 % VA CREA: 1.0000 | TOPICAL_CREAM | Freq: Every day | VAGINAL | 0 refills | Status: DC

## 2024-09-13 MED ORDER — FLUCONAZOLE 150 MG PO TABS: ORAL_TABLET | ORAL | 0 refills | Status: DC

## 2024-09-13 NOTE — Progress Notes (Signed)
 GYNECOLOGY  VISIT  CC:   vaginal symptoms  HPI: 53 y.o. G0P0000 Single White or Caucasian female here for concerns about vaginal symptoms.  In August, she went to [place].  While there, on August 12, she started having a significant lower abdominal, pelvic pain.  She had looser stools and nausea/emesis.  She never felt great the entire trip.  She saw Ginger Patrick who did a swab showing Gardnerella vaginalis.  She was treated with flagyl  500mg  bid x 7 days.  Discharge has changed now but is still having lower abdominal discomfort.  She's not had any more looser stools but is having more constipation.  She's had some blood when wipes that she thinks is maybe from straining but not sure.  Denies urinary issues except for some urinary incontinence that is stable.  Denies vaginal bleeding.  Has not had any fevers.    Past Medical History:  Diagnosis Date   Abnormal Pap smear    H/O CIN II   Allergy    Anemia    with iron infusions   Anxiety    Arthritis    Back pain    Costochondritis    Depression    GERD (gastroesophageal reflux disease)    Migraines    Psoriasis    STD (sexually transmitted disease)    HSV II    MEDS:   Current Outpatient Medications on File Prior to Visit  Medication Sig Dispense Refill   albuterol  (VENTOLIN  HFA) 108 (90 Base) MCG/ACT inhaler Inhale 2 puffs into the lungs every 4 (four) hours as needed for wheezing. 1 each 1   atorvastatin  (LIPITOR) 10 MG tablet TAKE 1 TABLET BY MOUTH EVERY DAY 30 tablet 5   augmented betamethasone  dipropionate (DIPROLENE -AF) 0.05 % ointment Apply topically 2 (two) times daily as needed. For palms and soles 15 g 0   budesonide -formoterol  (SYMBICORT ) 160-4.5 MCG/ACT inhaler Inhale 2 puffs into the lungs 2 (two) times daily. 1 each 12   celecoxib  (CELEBREX ) 100 MG capsule Take 1 capsule by mouth as needed up to every 12 hours as needed for joint pain 60 capsule 2   Cholecalciferol  1.25 MG (50000 UT) TABS Take 1 tablet by mouth once a  week. 12 tablet 0   cyclobenzaprine  (FLEXERIL ) 10 MG tablet Take 1/2 to one tablet po qhs prn muscle spasm     DULoxetine  (CYMBALTA ) 30 MG capsule TAKE 1 CAPSULE BY MOUTH EVERY DAY 30 capsule 5   etanercept  (ENBREL  SURECLICK) 50 MG/ML injection Inject 50 mg into the skin once a week. **DISCONTINUE TALTZ ** 12 mL 0   gabapentin  (NEURONTIN ) 100 MG capsule Take one po at bedtime at night time can increase by 100 mg every three days if tolerating with goal of 300 mg at nightime     hydrOXYzine  (VISTARIL ) 25 MG capsule Take 1 capsule (25 mg total) by mouth every 8 (eight) hours as needed. 30 capsule 0   Multiple Vitamin (MULTIVITAMIN PO) Take by mouth.     norethindrone  (AYGESTIN ) 5 MG tablet Take 0.5 tablets (2.5 mg total) by mouth daily. 90 tablet 3   omeprazole  (PRILOSEC) 20 MG capsule Take 1 capsule (20 mg total) by mouth daily. 30 capsule 0   rizatriptan  (MAXALT -MLT) 5 MG disintegrating tablet Take 1 tablet (5 mg total) by mouth as needed for migraine. May repeat in 2 hours if needed 10 tablet 11   SUMAtriptan  (IMITREX ) 100 MG tablet Take 1 tablet (100 mg total) by mouth every 2 (two) hours as needed for  migraine. May repeat in 2 hours if headache persists or recurs. 9 tablet 1   traZODone  (DESYREL ) 50 MG tablet TAKE 1 TABLET (50 MG TOTAL) BY MOUTH AT BEDTIME AS NEEDED. FOR SLEEP 90 tablet 3   triamcinolone  cream (KENALOG ) 0.1 % Apply 1 Application topically 2 (two) times daily as needed. For body 454 g 0   valACYclovir  (VALTREX ) 500 MG tablet Take 1 tablet (500 mg total) by mouth daily as needed. Increase to bid x 3 days with any symptoms.     ZORYVE 0.3 % CREA Apply topically.     No current facility-administered medications on file prior to visit.    ALLERGIES: Codeine, Sulfa antibiotics, and Tramadol  SH:  single, non  Review of Systems  Constitutional: Negative.   Genitourinary:        Discharge    PHYSICAL EXAMINATION:    BP 130/76 (BP Location: Right Arm, Patient Position:  Sitting, Cuff Size: Large)   Pulse 93   Ht 5' 11 (1.803 m)   Wt 294 lb (133.4 kg)   SpO2 99%   BMI 41.00 kg/m     General appearance: alert, cooperative and appears stated age Lymph:  no inguinal LAD noted Pelvic: External genitalia:  no lesions              Urethra:  normal appearing urethra with no masses, tenderness or lesions              Bartholins and Skenes: normal                 Vagina: normal mucosa, whitish discharge present, no lesions              Cervix: no lesions  Chaperone was present for exam.  Assessment/Plan: 1. Vaginal discharge (Primary) - Cervicovaginal ancillary only( [place]).  Findings c/w yeast.  Will go ahead and proceed with treatment.   - fluconazole (DIFLUCAN) 150 MG tablet; Take 1 and repeat 3 days  Dispense: 2 tablet; Refill: 0 - terconazole (TERAZOL 7) 0.4 % vaginal cream; Place 1 applicator vaginally at bedtime.  Dispense: 45 g; Refill: 0

## 2024-09-14 LAB — CERVICOVAGINAL ANCILLARY ONLY
Bacterial Vaginitis (gardnerella): NEGATIVE
Candida Glabrata: NEGATIVE
Candida Vaginitis: POSITIVE — AB
Comment: NEGATIVE
Comment: NEGATIVE
Comment: NEGATIVE

## 2024-09-15 ENCOUNTER — Ambulatory Visit (HOSPITAL_BASED_OUTPATIENT_CLINIC_OR_DEPARTMENT_OTHER): Payer: Self-pay | Admitting: Obstetrics & Gynecology
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
## 2024-09-24 ENCOUNTER — Other Ambulatory Visit: Payer: Self-pay | Admitting: Internal Medicine

## 2024-09-24 DIAGNOSIS — L409 Psoriasis, unspecified: Secondary | ICD-10-CM

## 2024-09-24 DIAGNOSIS — L4050 Arthropathic psoriasis, unspecified: Secondary | ICD-10-CM

## 2024-09-24 DIAGNOSIS — Z79899 Other long term (current) drug therapy: Secondary | ICD-10-CM

## 2024-09-25 NOTE — Telephone Encounter (Signed)
 Last Fill: 07/04/2024  Labs: 06/21/2024 CRP of 5.1 remains normal improved compared to 17 before starting the Taltz  despite the lack of joint improvement. Blood counts and kidney and liver function test are normal. I see no problem continuing the Celebrex  and do not see any problem with going ahead changing to the Enbrel  as planned.   TB Gold: 03/27/2024 Neg   Next Visit: 10/03/2024  Last Visit: 06/21/2024  IK:Ednmpjupr arthritis   Current Dose per office note 06/21/2024: Enbrel  50 mg [place] weekly   Okay to refill Enbrel ?

## 2024-10-02 ENCOUNTER — Encounter: Payer: Self-pay | Admitting: Family

## 2024-10-03 ENCOUNTER — Ambulatory Visit: Attending: Internal Medicine | Admitting: Internal Medicine

## 2024-10-03 ENCOUNTER — Encounter: Payer: Self-pay | Admitting: Internal Medicine

## 2024-10-03 VITALS — BP 131/89 | HR 84 | Temp 97.8°F | Resp 16 | Ht 71.0 in | Wt 290.8 lb

## 2024-10-03 DIAGNOSIS — M1380 Other specified arthritis, unspecified site: Secondary | ICD-10-CM

## 2024-10-03 DIAGNOSIS — L4050 Arthropathic psoriasis, unspecified: Secondary | ICD-10-CM

## 2024-10-03 DIAGNOSIS — K760 Fatty (change of) liver, not elsewhere classified: Secondary | ICD-10-CM

## 2024-10-03 DIAGNOSIS — L409 Psoriasis, unspecified: Secondary | ICD-10-CM | POA: Diagnosis not present

## 2024-10-03 DIAGNOSIS — Z79899 Other long term (current) drug therapy: Secondary | ICD-10-CM | POA: Diagnosis not present

## 2024-10-03 MED ORDER — CELECOXIB 200 MG PO CAPS: ORAL_CAPSULE | ORAL | 2 refills | Status: DC

## 2024-10-04 LAB — COMPREHENSIVE METABOLIC PANEL WITH GFR
AG Ratio: 1.5 (calc) (ref 1.0–2.5)
ALT: 21 U/L (ref 6–29)
AST: 18 U/L (ref 10–35)
Albumin: 4.2 g/dL (ref 3.6–5.1)
Alkaline phosphatase (APISO): 60 U/L (ref 37–153)
BUN: 14 mg/dL (ref 7–25)
CO2: 28 mmol/L (ref 20–32)
Calcium: 9.9 mg/dL (ref 8.6–10.4)
Chloride: 105 mmol/L (ref 98–110)
Creat: 0.57 mg/dL (ref 0.50–1.03)
Globulin: 2.8 g/dL (ref 1.9–3.7)
Glucose, Bld: 106 mg/dL — ABNORMAL HIGH (ref 65–99)
Potassium: 4.5 mmol/L (ref 3.5–5.3)
Sodium: 140 mmol/L (ref 135–146)
Total Bilirubin: 0.6 mg/dL (ref 0.2–1.2)
Total Protein: 7 g/dL (ref 6.1–8.1)
eGFR: 109 mL/min/1.73m2 (ref >=60)

## 2024-10-04 LAB — CBC WITH DIFFERENTIAL/PLATELET
Absolute Lymphocytes: 1498 {cells}/uL (ref 850–3900)
Absolute Monocytes: 800 {cells}/uL (ref 200–950)
Basophils Absolute: 70 {cells}/uL (ref 0–200)
Basophils Relative: 1.1 %
Eosinophils Absolute: 192 {cells}/uL (ref 15–500)
Eosinophils Relative: 3 %
HCT: 41.8 % (ref 35.0–45.0)
Hemoglobin: 14 g/dL (ref 11.7–15.5)
MCH: 29.1 pg (ref 27.0–33.0)
MCHC: 33.5 g/dL (ref 32.0–36.0)
MCV: 86.9 fL (ref 80.0–100.0)
MPV: 10.6 fL (ref 7.5–12.5)
Monocytes Relative: 12.5 %
Neutro Abs: 3840 {cells}/uL (ref 1500–7800)
Neutrophils Relative %: 60 %
Platelets: 305 Thousand/uL (ref 140–400)
RBC: 4.81 Million/uL (ref 3.80–5.10)
RDW: 12.1 % (ref 11.0–15.0)
Total Lymphocyte: 23.4 %
WBC: 6.4 Thousand/uL (ref 3.8–10.8)

## 2024-10-04 LAB — C-REACTIVE PROTEIN: CRP: <3 mg/L (ref <8.0)

## 2024-10-04 LAB — SEDIMENTATION RATE: Sed Rate: 6 mm/h (ref 0–30)

## 2024-10-11 ENCOUNTER — Ambulatory Visit: Payer: Self-pay | Admitting: Family

## 2024-10-11 ENCOUNTER — Ambulatory Visit: Admitting: Family

## 2024-10-11 ENCOUNTER — Other Ambulatory Visit: Payer: Self-pay | Admitting: Family

## 2024-10-11 ENCOUNTER — Ambulatory Visit (INDEPENDENT_AMBULATORY_CARE_PROVIDER_SITE_OTHER)
Admission: RE | Admit: 2024-10-11 | Discharge: 2024-10-11 | Disposition: A | Discharge status: Home / Self Care | Source: Ambulatory Visit | Attending: Family | Admitting: Family

## 2024-10-11 ENCOUNTER — Ambulatory Visit (INDEPENDENT_AMBULATORY_CARE_PROVIDER_SITE_OTHER)

## 2024-10-11 ENCOUNTER — Encounter: Payer: Self-pay | Admitting: Family

## 2024-10-11 VITALS — BP 124/84 | HR 95 | Temp 98.0°F | Wt 295.0 lb

## 2024-10-11 DIAGNOSIS — S99912A Unspecified injury of left ankle, initial encounter: Secondary | ICD-10-CM

## 2024-10-11 DIAGNOSIS — S82892A Other fracture of left lower leg, initial encounter for closed fracture: Secondary | ICD-10-CM

## 2024-10-11 DIAGNOSIS — S82832A Other fracture of upper and lower end of left fibula, initial encounter for closed fracture: Secondary | ICD-10-CM

## 2024-10-11 DIAGNOSIS — E782 Mixed hyperlipidemia: Secondary | ICD-10-CM

## 2024-10-11 DIAGNOSIS — S82422A Displaced transverse fracture of shaft of left fibula, initial encounter for closed fracture: Secondary | ICD-10-CM | POA: Diagnosis not present

## 2024-10-11 DIAGNOSIS — F4323 Adjustment disorder with mixed anxiety and depressed mood: Secondary | ICD-10-CM

## 2024-10-11 NOTE — Progress Notes (Unsigned)
 Orthopaedic Surgery New Patient Visit   History of Present Illness: The patient is a 53 y.o. female seen in clinic for left ankle fracture management.  Patient status post left ankle fracture. DOI: 09/07/2024. Patient rolled ankle on steps at the [facility] in [place]. Audible crack and pain with weightbearing. Seen at [facility]. Diagnosed with fracture. Advised to wear CAM boot while ambulatory for 4 weeks and given crutches.  Saw her PCP, Ginger Patrick FNP with [facility] at [facility], on 10/11/2024, at almost 5 weeks. PCP recommended patient follow-up with orthopedist.  Patient presents to [facility] today for management. Patient reports improved edema and discomfort; improving, but not completely resolved. Mild in severity. Was taking rx ibuprofen 800mg  initially, but no longer requiring rx or OTC medication for pain relief. Has been elevating the foot at night. Used crutches for 4 days s/p injury, but discontinued use due to imbalance issues. Primarily wearing the boot, except when sleeping. Over past few days, has been walking around the house without the boot. No significant pain with weightbearing. Denies foot weakness or paresthesias. Patient with no known history of left ankle fracture, however, states was active in sports in high school and remembers previous ankle sprains.  Patient works as a architect for older adults. Has returned to work without issue.  Patient with psoriatic arthritis. Managed by rheumatologist, Dr. Jeannetta with [facility]. Currently on Enbrel  (in process of transitioning to Rinvoq, pending insurance approval). On Celebrex  for arthritis pain.  BUN 14, Cr 0.57, and eGFR 109 on 10/03/2024, one week ago. Patient with DM2 (newly diagnosed), last A1c 6.5% on 08/18/2024, one month ago. Not currently on any medication.   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 ovary/appendectomy   OVARIAN CYST REMOVAL  12/07/1998   urethral stretching  12/08/1991   WISDOM TOOTH EXTRACTION     Allergies  Allergen Reactions   Codeine Nausea And Vomiting    Excessive Sweating   Sulfa Antibiotics Hives   Tramadol Nausea And Vomiting   Current Outpatient Medications on File Prior to Visit  Medication Sig Dispense Refill   albuterol  (VENTOLIN  HFA) 108 (90 Base) MCG/ACT inhaler Inhale 2 puffs into the lungs every 4 (four) hours as needed for wheezing. 1 each 1   atorvastatin  (LIPITOR) 10 MG tablet TAKE 1 TABLET BY MOUTH EVERY DAY 90 tablet 3   augmented betamethasone  dipropionate (DIPROLENE -AF) 0.05 % ointment Apply topically 2 (two) times daily as needed. For palms and soles 15 g 0   budesonide -formoterol  (SYMBICORT ) 160-4.5 MCG/ACT inhaler Inhale 2 puffs into the lungs 2 (two) times daily. 1 each 12   celecoxib  (CELEBREX ) 200 MG capsule Take 1 capsule  by mouth as needed up to every 12 hours as needed for joint pain 60 capsule 2   DULoxetine  (CYMBALTA ) 30 MG capsule TAKE 1 CAPSULE BY MOUTH EVERY DAY 90 capsule 3   Magnesium Citrate (MAGNESIUM GUMMIES PO) Take by mouth daily.     Multiple Vitamin (MULTIVITAMIN PO) Take by mouth.     norethindrone  (AYGESTIN ) 5 MG tablet Take 0.5 tablets (2.5 mg total) by mouth daily. 90 tablet 3   omeprazole  (PRILOSEC) 20 MG capsule Take 1 capsule (20 mg total) by mouth daily. (Patient taking differently: Take  20 mg by mouth daily. Not yet started) 30 capsule 0   rizatriptan  (MAXALT -MLT) 5 MG disintegrating tablet Take 1 tablet (5 mg total) by mouth as needed for migraine. May repeat in 2 hours if needed 10 tablet 11   SUMAtriptan  (IMITREX ) 100 MG tablet Take 1 tablet (100 mg total) by mouth every 2 (two) hours as needed for migraine. May repeat in 2 hours if headache persists or recurs. 9 tablet 1   traZODone  (DESYREL ) 50 MG tablet TAKE 1 TABLET (50 MG TOTAL) BY MOUTH AT BEDTIME AS NEEDED. FOR SLEEP 90 tablet 3   triamcinolone  cream (KENALOG ) 0.1 % Apply 1 Application topically 2 (two) times daily as needed. For body 454 g 0   UNABLE TO FIND at bedtime. Med Name: CPAP machine     valACYclovir  (VALTREX ) 500 MG tablet Take 1 tablet (500 mg total) by mouth daily as needed. Increase to bid x 3 days with any symptoms.     No current facility-administered medications on file prior to visit.   Social History   Tobacco Use   Smoking status: Never    Passive exposure: Past   Smokeless tobacco: Never  Vaping Use   Vaping status: Never Used  Substance Use Topics   Alcohol use: Not Currently    Alcohol/week: 1.0 standard drink of alcohol    Types: 1 Standard drinks or equivalent per week   Drug use: No      I have reviewed past medical, surgical, social and family history, medications and allergies as documented in the EMR.  Review of Systems - A ROS was performed including pertinent positives and negatives as documented in the HPI.     Physical Exam:  General/Constitutional: NAD Vascular: No edema, swelling or tenderness, except as noted in detailed exam Integumentary: No impressive skin lesions present, except as noted in detailed exam Neuro/Psych: Normal mood and affect, oriented to person, place and time Musculoskeletal: Normal, except as noted in detailed exam and in HPI   Focused Orthopaedic Examination: Left ankle: Left ankle inspection reveals skin intact with moderate  circumferential edema, which appears similar to her contralateral ankle. No visible ecchymosis. No gross deformity. Mild tenderness with palpation over distal fibula.  Ankle with minimally limited ROM with inversion. No pain or limited ROM with eversion. 2+ dorsalis pedis/posterior tibialis pulses Brisk cap refill x 5 toes Sensation intact to light touch to Superficial peroneal/Deep peroneal/Tibial/Sural/Saphenous nerves Motor function present to EHL, FHL, Tibialis anterior, gastrocnemius  Compartments soft and compressible. Negative Homans sign.  Imaging: X-rays of the Left ankle including Ap/mortise/lateral views obtained today 10/11/2024 at [facility] were reviewed personally by me and with patient.  Per my independent interpretation these images show minimally displaced transverse fracture of the distal fibula with bridging callus formation at fracture site. Mild soft tissue swelling over lateral malleolus.  Ankle mortise intact.    Radiology Read:  Left ankle xray  10/11/2024  FINDINGS: Ankle mortise is normal. There is a chronic well corticated fragment adjacent the tip of the fibula. There is also evidence of a transverse fracture of the tip of the distal fibula with minimal displacement. Small inferior calcaneal spur. Mild degenerate change of the midfoot. Mild soft tissue swelling over the ankle.   IMPRESSION: 1. Minimally displaced transverse fracture of the tip of the distal fibula. 2. Mild soft tissue swelling over the ankle.  Assessment:   5 weeks status post left distal fibula avulsion fracture, Weber A  Plan:  Patient was seen and examined in office today. We reviewed patient's history, examination, and imaging in detail. Based on information available for this encounter, patient seen approximately 5 weeks status post left ankle injury.  Patient was diagnosed with closed distal fibula fracture via urgent care and advised to wear a walking  boot with weightbearing as tolerated.  Patient followed up with PCP at approximately 4 weeks, as advised by urgent care, and was referred to orthopedist for continued fracture management.  Patient reports gradual improvement in pain, edema, and ecchymosis.  Patient is using CAM boot for immobilization.  Patient states she has been wearing as advised, only taking off at night.  Patient with no significant discomfort with weightbearing.  X-ray performed today reveals early bridging callus formation at fracture site.  Stable fracture pattern.  Advised patient over the next 2 weeks to gradually wean out of cam boot.  Patient has lace up ankle brace that she plans to use during transition period.  Patient scheduled to return to clinic in 4 to 6 weeks for reevaluation.  At that time, based on progress, may refer patient to physical therapy to ensure full optimization of range of motion and strength.  Patient does plan to perform some at home stretching and strengthening exercises.  Advised patient to follow-up sooner if any issues or concerns.    All questions, concerns and comments were addressed to the best of my ability.  Follow-up: 4-6 weeks   Arlyss GEANNIE Schneider, DO Orthopedic Surgery & Sports Medicine [place]   This document was dictated using Dragon voice recognition software. A reasonable attempt at proof reading has been made to minimize errors.

## 2024-10-11 NOTE — Progress Notes (Signed)
 Established Patient Office Visit  Subjective:      CC:  Chief Complaint  Patient presents with   Follow-up    Broke her L ankle on 09/07/24    HPI: Sandy [name] is a 53 y.o. female presenting on 10/11/2024 for Follow-up (Broke her L ankle on 09/07/24)   Discussed the use of AI scribe software for clinical note transcription with the patient, who gave verbal consent to proceed.  History of Present Illness Sandy [name] is a 53 year old female who presents with a broken ankle.  She sustained a broken ankle on September 07, 2024, after a fall at the [employer] in [place]. She heard the bone break and was initially unable to bear weight on it. An x-ray at urgent care ([facility] [address] in [place]) confirmed the fracture. She was advised to wear a boot for four weeks and was given crutches, which she used for a few days before discontinuing due to difficulty, she was not referred to orthopedist she only was advised to f/u with her pcp in four weeks without further direction. She experiences persistent soreness and swelling of the ankle, with increased soreness at the end of the day. 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  She has a history of psoriatic arthritis and has been switched from Enbrel  to Rinvoq, pending insurance approval. Previous treatments, including Tremfya and Taltz , were ineffective for her joint pain and worsened her skin condition. She experiences significant joint pain, particularly in her fingers, impacting her ability to perform tasks.  She has severe sleep apnea and long COVID lung issues. She uses a CPAP machine, which improves her symptoms when used consistently. However, she has been falling asleep in her recliner recently, affecting her CPAP use.  Her current medications include duloxetine  and atorvastatin , which were recently  refilled. She was previously on ibuprofen 800 mg for pain management of her ankle but has not needed it recently. Went to urgent care [address] 10/2 for ankle injury which was confirmed fracture  Went to [facility] urgent care on [address].          Social history:  Relevant past medical, surgical, family and social history reviewed and updated as indicated. Interim medical history since our last visit reviewed.  Allergies and medications reviewed and updated.  DATA REVIEWED: CHART IN EPIC     ROS: Negative unless specifically indicated above in HPI.    Current Outpatient Medications:    albuterol  (VENTOLIN  HFA) 108 (90 Base) MCG/ACT inhaler, Inhale 2 puffs into the lungs every 4 (four) hours as needed for wheezing., Disp: 1 each, Rfl: 1   augmented betamethasone  dipropionate (DIPROLENE -AF) 0.05 % ointment, Apply topically 2 (two) times daily as needed. For palms and soles, Disp: 15 g, Rfl: 0   budesonide -formoterol  (SYMBICORT ) 160-4.5 MCG/ACT inhaler, Inhale 2 puffs into the lungs 2 (two) times daily., Disp: 1 each, Rfl: 12   celecoxib  (CELEBREX ) 200 MG capsule, Take 1 capsule by mouth as needed up to every 12 hours as needed for joint pain, Disp: 60 capsule, Rfl: 2   Magnesium Citrate (MAGNESIUM GUMMIES PO), Take by mouth daily., Disp: , Rfl:    Multiple Vitamin (MULTIVITAMIN PO), Take by mouth., Disp: , Rfl:    norethindrone  (AYGESTIN ) 5 MG tablet, Take 0.5 tablets (2.5 mg total) by mouth daily., Disp:  90 tablet, Rfl: 3   omeprazole  (PRILOSEC) 20 MG capsule, Take 1 capsule (20 mg total) by mouth daily. (Patient taking differently: Take 20 mg by mouth daily. Not yet started), Disp: 30 capsule, Rfl: 0   rizatriptan  (MAXALT -MLT) 5 MG disintegrating tablet, Take 1 tablet (5 mg total) by mouth as needed for migraine. May repeat in 2 hours if needed, Disp: 10 tablet, Rfl: 11   SUMAtriptan  (IMITREX ) 100 MG tablet, Take 1 tablet (100 mg total) by mouth every 2 (two) hours as  needed for migraine. May repeat in 2 hours if headache persists or recurs., Disp: 9 tablet, Rfl: 1   traZODone  (DESYREL ) 50 MG tablet, TAKE 1 TABLET (50 MG TOTAL) BY MOUTH AT BEDTIME AS NEEDED. FOR SLEEP, Disp: 90 tablet, Rfl: 3   triamcinolone  cream (KENALOG ) 0.1 %, Apply 1 Application topically 2 (two) times daily as needed. For body, Disp: 454 g, Rfl: 0   UNABLE TO FIND, at bedtime. Med Name: CPAP machine, Disp: , Rfl:    valACYclovir  (VALTREX ) 500 MG tablet, Take 1 tablet (500 mg total) by mouth daily as needed. Increase to bid x 3 days with any symptoms., Disp: , Rfl:    atorvastatin  (LIPITOR) 10 MG tablet, TAKE 1 TABLET BY MOUTH EVERY DAY, Disp: 90 tablet, Rfl: 3   DULoxetine  (CYMBALTA ) 30 MG capsule, TAKE 1 CAPSULE BY MOUTH EVERY DAY, Disp: 90 capsule, Rfl: 3        Objective:        BP 124/84 (BP Location: Right Arm, Patient Position: Sitting, Cuff Size: Large)   Pulse 95   Temp 98 F (36.7 C) (Temporal)   Wt 295 lb (133.8 kg)   SpO2 98%   BMI 41.14 kg/m   Physical Exam EXTREMITIES: Left lateral ankle swollen and sore.  Wt Readings from Last 3 Encounters:  10/11/24 295 lb (133.8 kg)  10/03/24 290 lb 12.8 oz (131.9 kg)  09/13/24 294 lb (133.4 kg)    Physical Exam Musculoskeletal:     Left ankle: Swelling present. Tenderness present over the lateral malleolus. Decreased range of motion.         Results RADIOLOGY Ankle X-ray: Avulsion fracture under the metatarsal head (09/07/2024)  DIAGNOSTIC Sleep study: Severe obstructive sleep apnea  Assessment & Plan:   Assessment and Plan Assessment & Plan Closed fracture of left lateral lower leg and left ankle injury Closed fracture of the left lateral lower leg and left ankle injury sustained on October 2nd, 2025. Initial management included wearing a boot for four weeks and using crutches. Persistent soreness and swelling, especially at the end of the day. Limited range of motion and difficulty with weight-bearing  activities. Differential includes avulsion fracture, which typically heals well with non-weight bearing status. Concerns about potential improper healing due to delayed orthopedic evaluation. - Ordered repeat ankle x-ray, reviewed in office closed avulsion healed with non union  - Referred to orthopedist for further evaluation and management  Psoriatic arthritis and psoriasis Psoriatic arthritis and psoriasis with previous treatments including Enbrel , Tremfya, and Taltz . Enbrel  worsened skin condition, while Taltz  was effective for joint pain but aggravated skin. Currently transitioning to Rinvoq, pending insurance approval, for targeted treatment of arthritis and skin symptoms. Concerns about joint pain and skin condition persist. - Continue transition to Rinvoq pending insurance approval with care of rheumatology       Return if symptoms worsen or fail to improve.     Ginger Patrick, MSN, APRN, FNP-C [facility]

## 2024-10-11 NOTE — Patient Instructions (Signed)
 [facility] [address], [place] [address] [phone]   Thank you for visiting the office today. We appreciate your trust and allowing us  to help you with your orthopedic needs.  Please do not hesitate to call if you have further questions or concerns following your visit with us . If you experience life-threatening symptoms or it cannot wait until normal office hours, please go to the nearest Emergency Department for immediate evaluation.      Gradually discontinue Boot in two weeks  Tylenol  for pain prn Try Gentle HEP

## 2024-10-19 ENCOUNTER — Telehealth: Payer: Self-pay

## 2024-10-19 ENCOUNTER — Telehealth: Payer: Self-pay | Admitting: *Deleted

## 2024-10-19 DIAGNOSIS — Z79899 Other long term (current) drug therapy: Secondary | ICD-10-CM

## 2024-10-19 DIAGNOSIS — L409 Psoriasis, unspecified: Secondary | ICD-10-CM

## 2024-10-19 DIAGNOSIS — L4050 Arthropathic psoriasis, unspecified: Secondary | ICD-10-CM

## 2024-10-19 NOTE — Telephone Encounter (Signed)
 Submitted an URGENT Prior Authorization request to [facility] for [facility] via. Will update once we receive a response.  EOC ID # [id number]  Sherry Pennant, PharmD, MPH, BCPS, CPP Clinical Pharmacist [facility])

## 2024-10-19 NOTE — Telephone Encounter (Signed)
 Patient called to follow up on switching medication from Enbrel  to Rinvoq.  Per office note on 10/03/2024: Discontinue Enbrel  after last dose. - Initiate Rinvoq pending prior authorization.  Please perform BIV

## 2024-10-19 NOTE — Telephone Encounter (Signed)
 Initiated in new encounter. Thank you

## 2024-10-20 ENCOUNTER — Other Ambulatory Visit (HOSPITAL_COMMUNITY): Payer: Self-pay

## 2024-10-20 MED ORDER — RINVOQ 15 MG PO TB24: 15.0000 mg | ORAL_TABLET | Freq: Every day | ORAL | 2 refills | Status: DC

## 2024-10-20 NOTE — Telephone Encounter (Signed)
 Received notification from [facility] regarding a prior authorization for [facility]. Authorization has been APPROVED from 10/19/2024 to 04/18/2025. Approval letter sent to scan center.  Patient must fill through [facility]: [phone]   Authorization # [id number]  Email sent to below contacts to have override placed for patient to fill with [facility].    Rx for Rinvoq sent to [facility] today  Enrolled patient into Rinvoq copay card in [employer] portal: ID: [id number] Group: [id number] BIN: [id number] PCN: [id number] Suf: 01 Issued: 10/20/2024  Sherry Pennant, PharmD, MPH, BCPS, CPP Clinical Pharmacist [facility])

## 2024-10-23 ENCOUNTER — Encounter (HOSPITAL_BASED_OUTPATIENT_CLINIC_OR_DEPARTMENT_OTHER): Payer: Self-pay

## 2024-10-23 ENCOUNTER — Telehealth (HOSPITAL_BASED_OUTPATIENT_CLINIC_OR_DEPARTMENT_OTHER): Payer: Self-pay

## 2024-10-23 ENCOUNTER — Other Ambulatory Visit (HOSPITAL_BASED_OUTPATIENT_CLINIC_OR_DEPARTMENT_OTHER): Payer: Self-pay | Admitting: Obstetrics & Gynecology

## 2024-10-23 DIAGNOSIS — N926 Irregular menstruation, unspecified: Secondary | ICD-10-CM

## 2024-10-23 DIAGNOSIS — R140 Abdominal distension (gaseous): Secondary | ICD-10-CM

## 2024-10-23 NOTE — Telephone Encounter (Signed)
 Pt states that she was in on 09/13/2024 to see Dr. Cleotilde and was told if started having new issues to contact her. Pt states that she is now spotting and Pt would like for Dr. Cleotilde to contact her.

## 2024-10-23 NOTE — Telephone Encounter (Signed)
 Called patient, no answer and voicemail left. Mychart message also sent.   Morna LOISE Quale, RN

## 2024-10-24 ENCOUNTER — Other Ambulatory Visit (HOSPITAL_BASED_OUTPATIENT_CLINIC_OR_DEPARTMENT_OTHER)

## 2024-10-24 ENCOUNTER — Telehealth: Payer: Self-pay | Admitting: *Deleted

## 2024-10-24 NOTE — Telephone Encounter (Signed)
 Email re-sent for update on patient's Rinvoq override because patient reached out regarding status update despite being advised that we'd reach out when override is placed  Will await response from employer  Sherry Pennant, PharmD, MPH, BCPS, CPP Clinical Pharmacist [facility])

## 2024-10-24 NOTE — Telephone Encounter (Signed)
 Patient contacted the office and states her prescription for Rinvoq has expired.    Returned call to patient to advised her prescription was sent in on 10/20/2024. Patient states there is an issue that she has to have authorization from [facility] to fill with [facility]. Reached out to Devki and she states she has reached out to the patient's employer. Patient can pick up sample.  Attempted to return call to patient to advise she may come pick up a sample.

## 2024-10-25 NOTE — Telephone Encounter (Signed)
 Attempted to contact the patient and left message for patient to call the office.

## 2024-10-25 NOTE — Telephone Encounter (Signed)
 Advised patient of the information below. Patient will have her mother, Sandy [name] come to the office to pick up a sample.   Medication reserved for the patient.   Last Fill: 10/20/2024 (patient did not receive this rx)  Labs: 10/03/2024 glucose 106  TB Gold: 03/27/2024 negative    Next Visit: 12/29/2024  Last Visit: 10/03/2024  IK:Ednmpjupr arthritis   Current Dose per office note on 10/03/2024: Initiate Rinvoq pending prior authorization.

## 2024-10-26 NOTE — Telephone Encounter (Signed)
 Rinvoq  sample picked up by patient's mother  Rinvoq  15mg  tablets #28 tablets [id number]:[id number] Lot: [id number] Exp: 11/2025 Directions: take one tablet by mouth once daily  Sherry Pennant, PharmD, MPH, BCPS, CPP Clinical Pharmacist [facility])

## 2024-10-27 NOTE — Telephone Encounter (Signed)
 Pt sent MyChart message that everything has been resolved - closing encounter

## 2024-11-08 ENCOUNTER — Other Ambulatory Visit

## 2024-11-08 ENCOUNTER — Ambulatory Visit (INDEPENDENT_AMBULATORY_CARE_PROVIDER_SITE_OTHER)

## 2024-11-08 ENCOUNTER — Ambulatory Visit

## 2024-11-08 ENCOUNTER — Encounter (HOSPITAL_BASED_OUTPATIENT_CLINIC_OR_DEPARTMENT_OTHER): Payer: Self-pay | Admitting: Obstetrics & Gynecology

## 2024-11-08 ENCOUNTER — Ambulatory Visit (HOSPITAL_BASED_OUTPATIENT_CLINIC_OR_DEPARTMENT_OTHER): Admitting: Obstetrics & Gynecology

## 2024-11-08 VITALS — BP 133/82 | HR 102 | Wt 289.0 lb

## 2024-11-08 DIAGNOSIS — N950 Postmenopausal bleeding: Secondary | ICD-10-CM

## 2024-11-08 DIAGNOSIS — N926 Irregular menstruation, unspecified: Secondary | ICD-10-CM

## 2024-11-08 DIAGNOSIS — R140 Abdominal distension (gaseous): Secondary | ICD-10-CM

## 2024-11-08 NOTE — Progress Notes (Signed)
   Ultrasound f/u Patient name: Sandy [name] MRN [id number]  Date of birth: 11-12-1971 Chief Complaint:   Follow-up  History of Present Illness:   Sandy [name] is a 53 y.o. G0P0000 Caucasian female being seen today for discussion of ultrasound findings done due to two day of pinkish bleeding/discharge.  Had vaginitis testing that showed BV and she has been treated for this.    Ultrasound today showed uterus measuring about 7 x 4 x 5cm with thin, symmetric endometrium measuring 2.39mm.  Ovaries normal.  Small follicle in each ovary.  Cervix normal.  No free fluid noted.    Discussed findings.  Given discharge/bleeding has stopped and reassuring ultrasound findings, feel it is reasonable to follow conservatively.  Advised I would recommend endometrial biopsy if she has any additional abnormal spotting, bleeding or pink discharge.  Questions answered.  Pt comfortable with plan.   No LMP recorded. Patient is perimenopausal.   Last pap 12/24/2021. Results were: NILM w/ HRHPV negative.  Review of Systems:   Pertinent items are noted in HPI Pertinent History Reviewed:  Reviewed past medical,surgical, social and family history.  Reviewed problem list, medications and allergies. Physical Assessment:   Vitals:   11/08/24 1424  BP: 133/82  Pulse: (!) 102  SpO2: 97%  Weight: 289 lb (131.1 kg)  Body mass index is 40.31 kg/m.        Physical Examination:   General appearance - well appearing, and in no distress  Mental status - alert, oriented to person, place, and time  Psych:  She has a normal mood and affect  Assessment & Plan:  1. Postmenopausal bleeding (Primary) - ultrasound reassuring.  Will monitor conservatively and plan endometrial biopsy if she has any future abnormal symptoms.      Ronal GORMAN Pinal, MD 11/14/2024 1:32 PM GYNECOLOGY  VISIT

## 2024-11-15 ENCOUNTER — Ambulatory Visit

## 2024-11-15 ENCOUNTER — Ambulatory Visit: Admitting: Internal Medicine

## 2024-11-15 ENCOUNTER — Other Ambulatory Visit: Payer: Self-pay | Admitting: Obstetrics & Gynecology

## 2024-11-15 ENCOUNTER — Encounter: Payer: Self-pay | Admitting: Internal Medicine

## 2024-11-15 VITALS — BP 120/80 | HR 75 | Temp 98.7°F | Ht 70.75 in | Wt 288.2 lb

## 2024-11-15 DIAGNOSIS — G4733 Obstructive sleep apnea (adult) (pediatric): Secondary | ICD-10-CM

## 2024-11-15 DIAGNOSIS — Z1231 Encounter for screening mammogram for malignant neoplasm of breast: Secondary | ICD-10-CM

## 2024-11-15 DIAGNOSIS — J4520 Mild intermittent asthma, uncomplicated: Secondary | ICD-10-CM

## 2024-11-15 DIAGNOSIS — J4550 Severe persistent asthma, uncomplicated: Secondary | ICD-10-CM

## 2024-11-15 LAB — PULMONARY FUNCTION TEST
DL/VA % pred: 112 %
DL/VA: 4.56 ml/min/mmHg/L
DLCO unc % pred: 94 %
DLCO unc: 24.47 ml/min/mmHg
FEF 25-75 Post: 3.97 L/s
FEF 25-75 Pre: 3.15 L/s
FEF2575-%Change-Post: 26 %
FEF2575-%Pred-Post: 128 %
FEF2575-%Pred-Pre: 101 %
FEV1-%Change-Post: 5 %
FEV1-%Pred-Post: 102 %
FEV1-%Pred-Pre: 96 %
FEV1-Post: 3.5 L
FEV1-Pre: 3.33 L
FEV1FVC-%Change-Post: 4 %
FEV1FVC-%Pred-Pre: 100 %
FEV6-%Change-Post: 1 %
FEV6-%Pred-Post: 98 %
FEV6-%Pred-Pre: 97 %
FEV6-Post: 4.2 L
FEV6-Pre: 4.16 L
FEV6FVC-%Change-Post: 0 %
FEV6FVC-%Pred-Post: 102 %
FEV6FVC-%Pred-Pre: 102 %
FVC-%Change-Post: 0 %
FVC-%Pred-Post: 96 %
FVC-%Pred-Pre: 95 %
FVC-Post: 4.2 L
FVC-Pre: 4.17 L
Post FEV1/FVC ratio: 83 %
Post FEV6/FVC ratio: 100 %
Pre FEV1/FVC ratio: 80 %
Pre FEV6/FVC Ratio: 100 %
RV % pred: 105 %
RV: 2.28 L
TLC % pred: 103 %
TLC: 6.28 L

## 2024-11-15 NOTE — Progress Notes (Signed)
 Full PFT completed today ? ?

## 2024-11-15 NOTE — Patient Instructions (Signed)
 Full PFT completed today ? ?

## 2024-11-15 NOTE — Progress Notes (Signed)
 [facility] Consultation      Date: 11/15/2024,   MRN# [id number] Sandy [name] 07-28-1971    CHIEF COMPLAINT:   Assessment of cough Assessment of sleep apnea   HISTORY OF PRESENT ILLNESS   Assessment of chronic cough Symptoms have been going on for over 3 years Intermittently with shortness of breath no chest pain involved Patient started on Symbicort  last office visit  Pulmonary function testing November 15, 2024 Postbronchodilator FEV1 FVC ratio 83% predicted FEV1 102% predicted FVC 96% predicted No significant bronchodilator response TLC is 103% predicted RV is 105% predicted DLCO is 94% predicted Volume loops no significant abnormalities Final interpretation no obstructive no restrictive disease normal point function testing    Dyspnea on exertion at times She is very active outdoors Non-smoker nonalcoholic No specific triggers She works at [employer] Several years ago was diagnosed with COVID Then she had a recent diagnosis of COVID 1 year ago Patient uses Symbicort  and albuterol  infrequently No evidence of respiratory compromise at this time  No exacerbation at this time No evidence of heart failure at this time No evidence or signs of infection at this time No respiratory distress No fevers, chills, nausea, vomiting, diarrhea No evidence of lower extremity edema No evidence hemoptysis    Patient diagnosed with sleep apnea patient reports her O2 sat levels dropped into the 80s and her AHI is approximately 28  Discussed sleep data and reviewed with patient.  Encouraged proper weight management.  Discussed sleep hygiene Patient uses and benefits from therapy Using CPAP nightly and with naps Settings are comfortable and is sleeping well. 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 ovary/appendectomy   OVARIAN CYST REMOVAL  12/07/1998   urethral stretching  12/08/1991   WISDOM TOOTH EXTRACTION       FAMILY HISTORY   Family History  Problem Relation Age of Onset   Thyroid disease Mother        and maternal side   Hypertension Mother    Heart Problems Mother        heart arythmia   Breast cancer Mother 29       had negative genetic testing   Cancer Mother    Diabetes Father    Cirrhosis Father    Alcohol abuse Father    Thrombocytopenia Sister        ITP  Heart attack Maternal Uncle    Colon cancer Paternal Aunt    Esophageal cancer Maternal Grandmother    Throat cancer Maternal Grandfather    Cervical cancer Paternal Grandmother    Colon polyps Neg Hx    Rectal cancer Neg Hx    Stomach cancer Neg Hx      SOCIAL HISTORY   Social History   Tobacco Use   Smoking status: Never    Passive exposure: Past   Smokeless tobacco: Never  Vaping Use   Vaping status: Never Used  Substance Use Topics   Alcohol use: Not Currently    Alcohol/week: 1.0 standard drink of alcohol    Types: 1 Standard drinks or equivalent per week   Drug use: No     MEDICATIONS    Home Medication:  Current Outpatient Rx   Order #:  [id number] Class: Normal   Order #: [id number] Class: Normal   Order #: [id number] Class: Normal   Order #: [id number] Class: Normal   Order #: [id number] Class: Normal   Order #: [id number] Class: Normal   Order #: [id number] Class: Historical Med   Order #: [id number] Class: Historical Med   Order #: [id number] Class: Normal   Order #: [id number] Class: Normal   Order #: [id number] Class: Normal   Order #: [id number] Class: Normal   Order #: [id number] Class: Normal   Order #: [id number] Class: Normal   Order #: [id number] Class: Historical Med   Order #: [id number] Class: Normal   Order #: [id number] Class: No Print    Current Medication:  Current Outpatient Medications:    albuterol  (VENTOLIN  HFA) 108 (90 Base) MCG/ACT inhaler, Inhale 2 puffs into the lungs every 4 (four) hours as needed for wheezing., Disp: 1 each, Rfl: 1   atorvastatin  (LIPITOR) 10 MG tablet, TAKE 1 TABLET BY MOUTH EVERY DAY, Disp: 90 tablet, Rfl: 3   augmented betamethasone  dipropionate (DIPROLENE -AF) 0.05 % ointment, Apply topically 2 (two) times daily as needed. For palms and soles, Disp: 15 g, Rfl: 0   budesonide -formoterol  (SYMBICORT ) 160-4.5 MCG/ACT inhaler, Inhale 2 puffs into the lungs 2 (two) times daily., Disp: 1 each, Rfl: 12   celecoxib  (CELEBREX ) 200 MG capsule, Take 1 capsule by mouth as needed up to every 12 hours as needed for joint pain, Disp: 60 capsule, Rfl: 2   DULoxetine  (CYMBALTA ) 30 MG capsule, TAKE 1 CAPSULE BY MOUTH EVERY DAY, Disp: 90 capsule, Rfl: 3   Magnesium Citrate (MAGNESIUM GUMMIES PO), Take by mouth daily., Disp: , Rfl:    Multiple Vitamin (MULTIVITAMIN PO), Take by mouth., Disp: , Rfl:    norethindrone  (AYGESTIN ) 5 MG tablet, Take 0.5 tablets (2.5 mg total) by mouth daily., Disp: 90 tablet, Rfl: 3   omeprazole  (PRILOSEC) 20 MG capsule, Take 1 capsule (20 mg total) by mouth daily. (Patient taking differently: Take 20 mg by mouth daily. Not yet started), Disp: 30 capsule, Rfl: 0   rizatriptan  (MAXALT -MLT) 5 MG  disintegrating tablet, Take 1 tablet (5 mg total) by mouth as needed for migraine. May repeat in 2 hours if needed, Disp: 10 tablet, Rfl: 11   SUMAtriptan  (IMITREX ) 100 MG tablet, Take 1 tablet (100 mg total) by mouth every 2 (two) hours as needed for migraine. May repeat in 2 hours if headache persists or recurs., Disp: 9 tablet, Rfl: 1   traZODone  (DESYREL ) 50 MG tablet, TAKE 1 TABLET (50 MG TOTAL) BY MOUTH AT BEDTIME AS NEEDED. FOR SLEEP, Disp: 90 tablet, Rfl: 3   triamcinolone  cream (KENALOG ) 0.1 %, Apply 1 Application topically 2 (two) times daily as needed. For  body, Disp: 454 g, Rfl: 0   UNABLE TO FIND, at bedtime. Med Name: CPAP machine, Disp: , Rfl:    Upadacitinib  ER (RINVOQ ) 15 MG TB24, Take 1 tablet (15 mg total) by mouth daily., Disp: 30 tablet, Rfl: 2   valACYclovir  (VALTREX ) 500 MG tablet, Take 1 tablet (500 mg total) by mouth daily as needed. Increase to bid x 3 days with any symptoms., Disp: , Rfl:     ALLERGIES   Codeine, Sulfa antibiotics, and Tramadol  BP 120/80   Pulse 75   Temp 98.7 F (37.1 C)   Ht 5' 10.75 (1.797 m)   Wt 288 lb 3.2 oz (130.7 kg)   SpO2 99%   BMI 40.48 kg/m    Physical examination Alert awake following commands no respiratory distress Lungs are clear to auscultation no wheezing S1-S2 no murmurs Pulses intact No focal deficits  Chest x-ray September 2025 Reviewed in detail independently by me today No significant findings no pneumonia no effusions no edema  ASSESSMENT/PLAN    53 year old morbidly obese white female with signs and symptoms of cough variant asthma due to previous history of COVID in the setting of morbid obesity along with on treated sleep apnea in the setting of deconditioned state  Cough variant asthma likely due to COVID and obesity Recommend weight loss Continue Symbicort  and albuterol  as needed Increase exercise activity  Assessment of OSA Patient reports severe AHI 140 with 80% saturations Recommend therapy  as soon as possible Auto CPAP 4-14 AHI reduced to 4.2 Patient needs to increase her compliance rate Follow-up in 3 months for further evaluation  Obesity -recommend significant weight loss -recommend changing diet  Deconditioned state -Recommend increased daily activity and exercise    MEDICATION ADJUSTMENTS/LABS AND TESTS ORDERED: Increased compliance rate for CPAP Symbicort  and albuterol  as needed Continue weight loss journey Avoid Allergens and Irritants Avoid secondhand smoke Avoid SICK contacts Recommend  Masking  when appropriate Recommend Keep up-to-date with vaccinations    CURRENT MEDICATIONS REVIEWED AT LENGTH WITH PATIENT TODAY   Patient  satisfied with Plan of action and management. All questions answered   Follow up 3 months   I spent a total of 43 minutes dedicated to the care of this patient on the date of this encounter to include pre-visit review of records, face-to-face time with the patient discussing conditions above, post visit ordering of testing, clinical documentation with the electronic health record, making appropriate referrals as documented, and communicating necessary information to the patient's healthcare team.    The Patient requires high complexity decision making for assessment and support, frequent evaluation and titration of therapies, application of advanced monitoring technologies and extensive interpretation of multiple databases.  Patient satisfied with Plan of action and management. All questions answered    Nickolas Alm Cellar, M.D.  [facility] Pulmonary & Critical Care Medicine  Medical Director [facility]

## 2024-11-15 NOTE — Patient Instructions (Signed)
 Recommend increasing your compliance with your CPAP This will help all around your energy activity shortness of breath  Continue on your weight loss journey  Plan to use Symbicort  and albuterol  as needed  Your breathing tests are normal  Avoid Allergens and Irritants Avoid secondhand smoke Avoid SICK contacts Recommend  Masking  when appropriate Recommend Keep up-to-date with vaccinations

## 2024-12-08 ENCOUNTER — Ambulatory Visit: Admitting: Primary Care

## 2024-12-08 ENCOUNTER — Encounter: Payer: Self-pay | Admitting: Primary Care

## 2024-12-08 ENCOUNTER — Encounter: Payer: Self-pay | Admitting: Family

## 2024-12-08 ENCOUNTER — Ambulatory Visit: Payer: Self-pay | Admitting: Primary Care

## 2024-12-08 VITALS — BP 98/68 | HR 94 | Temp 98.1°F | Ht 70.75 in | Wt 281.5 lb

## 2024-12-08 DIAGNOSIS — J020 Streptococcal pharyngitis: Secondary | ICD-10-CM

## 2024-12-08 DIAGNOSIS — R051 Acute cough: Secondary | ICD-10-CM | POA: Diagnosis not present

## 2024-12-08 LAB — POC COVID19 BINAXNOW: SARS Coronavirus 2 Ag: NEGATIVE

## 2024-12-08 LAB — POCT RAPID STREP A (OFFICE): Rapid Strep A Screen: POSITIVE — AB

## 2024-12-08 LAB — POCT INFLUENZA A/B
Influenza A, POC: NEGATIVE
Influenza B, POC: NEGATIVE

## 2024-12-08 MED ORDER — BENZONATATE 200 MG PO CAPS: 200.0000 mg | ORAL_CAPSULE | Freq: Three times a day (TID) | ORAL | 0 refills | Status: AC | PRN

## 2024-12-08 MED ORDER — AMOXICILLIN 500 MG PO CAPS: 500.0000 mg | ORAL_CAPSULE | Freq: Two times a day (BID) | ORAL | 0 refills | Status: AC

## 2024-12-08 NOTE — Patient Instructions (Signed)
 Start Benzonatate capsules for cough. Take 1 capsule by mouth three times daily as needed for cough.  Start amoxicillin antibiotics for the infection. Take 1 tablet by mouth twice daily for 10 days.  Fluids, rest!  It was a pleasure meeting you!

## 2024-12-08 NOTE — Telephone Encounter (Signed)
 Spoke with pt. She has been scheduled to see Mallie today at [phone].

## 2024-12-08 NOTE — Progress Notes (Signed)
 "  Subjective:    Patient ID: Sandy [name], female    DOB: 1971/07/12, 54 y.o.   MRN: [id number]  Sandy [name] is a very pleasant 54 y.o. female patient of Tabitha NP with a history of allergic sinusitis, OSA, psoriatic arthritis on who presents today to discuss acute cough.  Symptom onset 6 days ago with fatigue and body aches. She then developed cough, nasal/chest congestion, sore throat, decreased appetite. Her cough is worse at night. She's had to call out of work a few days this week. She's taking Dayquil and Nyquil, albuterol  inhaler. Today she's feeling slightly better, less achy.  She denies no known sick contacts.    Review of Systems  Constitutional:  Positive for appetite change and fatigue.  HENT:  Positive for congestion and sore throat.   Respiratory:  Positive for cough.   Musculoskeletal:  Positive for myalgias.         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   Migraines    Psoriasis    Sleep apnea 08/2024   STD (sexually transmitted disease)    HSV II    Social History   Socioeconomic History   Marital status: Single    Spouse name: Not on file   Number of children: Not on file   Years of education: Not on file   Highest education level: Bachelor's degree (e.g., BA, AB, BS)  Occupational History    Employer: [employer]    Comment: recreational therapist  Tobacco Use   Smoking status: Never    Passive exposure: Past   Smokeless tobacco: Never  Vaping Use   Vaping status: Never Used  Substance and Sexual Activity   Alcohol use: Not Currently    Alcohol/week: 1.0 standard drink of alcohol    Types: 1 Standard drinks or equivalent per week   Drug use: No   Sexual activity: Not Currently     Birth control/protection: Pill  Other Topics Concern   Not on file  Social History Narrative   Not on file   Social Drivers of Health   Tobacco Use: Low Risk (12/08/2024)   Patient History    Smoking Tobacco Use: Never    Smokeless Tobacco Use: Never    Passive Exposure: Past  Financial Resource Strain: Low Risk (10/31/2023)   Overall Financial Resource Strain (CARDIA)    Difficulty of Paying Living Expenses: Not very hard  Food Insecurity: No Food Insecurity (10/31/2023)   Hunger Vital Sign    Worried About Running Out of Food in the Last Year: Never true    Ran Out of Food in the Last Year: Never true  Transportation Needs: Unmet Transportation Needs (10/31/2023)   PRAPARE - Transportation    Lack of Transportation (Medical): No    Lack of Transportation (Non-Medical): Yes  Physical Activity: Insufficiently Active (10/31/2023)   Exercise Vital Sign    Days of Exercise per Week: 2 days    Minutes of Exercise per Session: 30 min  Stress: Stress Concern Present (10/31/2023)   [employer] of Occupational Health - Occupational Stress Questionnaire    Feeling of Stress :  Very much  Social Connections: Socially Isolated (10/31/2023)   Social Connection and Isolation Panel    Frequency of Communication with Friends and Family: Twice a week    Frequency of Social Gatherings with Friends and Family: Three times a week    Attends Religious Services: Never    Active Member of Clubs or Organizations: No    Attends Engineer, Structural: Not on file    Marital Status: Never married  Intimate Partner Violence: Not on file  Depression (PHQ2-9): Low Risk (12/08/2024)   Depression (PHQ2-9)    PHQ-2 Score: 0  Alcohol Screen: Not on file  Housing: Low Risk (10/31/2023)   Housing    Last Housing Risk Score: 0  Utilities: Not on file  Health Literacy: Not on file    Past Surgical History:  Procedure Laterality Date   ADENOIDECTOMY     APPENDECTOMY  12/07/1990   54 y/o    CRYOTHERAPY  12/08/2007   of cx   DERMOID CYST  EXCISION  12/07/1990   left ? ovary/appendectomy   OVARIAN CYST REMOVAL  12/07/1998   urethral stretching  12/08/1991   WISDOM TOOTH EXTRACTION      Family History  Problem Relation Age of Onset   Thyroid disease Mother        and maternal side   Hypertension Mother    Heart Problems Mother        heart arythmia   Breast cancer Mother 31       had negative genetic testing   Cancer Mother    Diabetes Father    Cirrhosis Father    Alcohol abuse Father    Thrombocytopenia Sister        ITP   Heart attack Maternal Uncle    Colon cancer Paternal Aunt    Esophageal cancer Maternal Grandmother    Throat cancer Maternal Grandfather    Cervical cancer Paternal Grandmother    Colon polyps Neg Hx    Rectal cancer Neg Hx    Stomach cancer Neg Hx     Allergies[1]  Medications Ordered Prior to Encounter[2]  BP 98/68   Pulse 94   Temp 98.1 F (36.7 C) (Oral)   Ht 5' 10.75 (1.797 m)   Wt 281 lb 8 oz (127.7 kg)   SpO2 96%   BMI 39.54 kg/m  Objective:   Physical Exam Constitutional:      Appearance: She is ill-appearing.  HENT:     Right Ear: Tympanic membrane and ear canal normal.     Left Ear: Tympanic membrane and ear canal normal.     Nose: No mucosal edema.     Right Sinus: No maxillary sinus tenderness or frontal sinus tenderness.     Left Sinus: No maxillary sinus tenderness or frontal sinus tenderness.     Mouth/Throat:     Mouth: Mucous membranes are moist.     Pharynx: Posterior oropharyngeal erythema present. No oropharyngeal exudate.  Eyes:     Conjunctiva/sclera: Conjunctivae normal.  Cardiovascular:     Rate and Rhythm: Normal rate and regular rhythm.  Pulmonary:     Effort: Pulmonary effort is normal.     Breath sounds: Normal breath sounds. No wheezing or rhonchi.     Comments: Dry, barking cough noted during exam Musculoskeletal:     Cervical back: Neck supple.  Skin:    General: Skin is warm and  dry.     Physical Exam        Assessment & Plan:  Strep pharyngitis Assessment & Plan: Exam today representative.   Rapid COVID-19 infection negative. Rapid influenza test negative today.  Rapid strep test positive.  Start amoxicillin antibiotics for the infection. Take 1 tablet by mouth twice daily for 10 days. Start Benzonatate capsules for cough. Take 1 capsule by mouth three times daily as needed for cough. Work note provided.   We also discussed conservative treatment OTC.  Orders: -     Benzonatate; Take 1 capsule (200 mg total) by mouth 3 (three) times daily as needed for cough.  Dispense: 15 capsule; Refill: 0 -     Amoxicillin; Take 1 capsule (500 mg total) by mouth 2 (two) times daily for 10 days.  Dispense: 20 capsule; Refill: 0  Acute cough -     POC COVID-19 BinaxNow -     POCT Influenza A/B -     POCT rapid strep A    Assessment and Plan Assessment & Plan         Comer MARLA Gaskins, NP       [1]  Allergies Allergen Reactions   Codeine Nausea And Vomiting    Excessive Sweating   Sulfa Antibiotics Hives   Tramadol Nausea And Vomiting  [2]  Current Outpatient Medications on File Prior to Visit  Medication Sig Dispense Refill   albuterol  (VENTOLIN  HFA) 108 (90 Base) MCG/ACT inhaler Inhale 2 puffs into the lungs every 4 (four) hours as needed for wheezing. 1 each 1   atorvastatin  (LIPITOR) 10 MG tablet TAKE 1 TABLET BY MOUTH EVERY DAY 90 tablet 3   augmented betamethasone  dipropionate (DIPROLENE -AF) 0.05 % ointment Apply topically 2 (two) times daily as needed. For palms and soles 15 g 0   budesonide -formoterol  (SYMBICORT ) 160-4.5 MCG/ACT inhaler Inhale 2 puffs into the lungs 2 (two) times daily. 1 each 12   celecoxib  (CELEBREX ) 200 MG capsule Take 1 capsule by mouth as needed up to every 12 hours as needed for joint pain 60 capsule 2   DULoxetine  (CYMBALTA ) 30 MG capsule TAKE 1 CAPSULE BY MOUTH EVERY DAY 90 capsule 3   Multiple Vitamin  (MULTIVITAMIN PO) Take by mouth.     norethindrone  (AYGESTIN ) 5 MG tablet Take 0.5 tablets (2.5 mg total) by mouth daily. 90 tablet 3   omeprazole  (PRILOSEC) 20 MG capsule Take 1 capsule (20 mg total) by mouth daily. (Patient taking differently: Take 20 mg by mouth daily. Not yet started) 30 capsule 0   rizatriptan  (MAXALT -MLT) 5 MG disintegrating tablet Take 1 tablet (5 mg total) by mouth as needed for migraine. May repeat in 2 hours if needed 10 tablet 11   SUMAtriptan  (IMITREX ) 100 MG tablet Take 1 tablet (100 mg total) by mouth every 2 (two) hours as needed for migraine. May repeat in 2 hours if headache persists or recurs. 9 tablet 1   traZODone  (DESYREL ) 50 MG tablet TAKE 1 TABLET (50 MG TOTAL) BY MOUTH AT BEDTIME AS NEEDED. FOR SLEEP 90 tablet 3   triamcinolone  cream (KENALOG ) 0.1 % Apply 1 Application topically 2 (two) times daily as needed. For body 454 g 0   UNABLE TO FIND at bedtime. Med Name: CPAP machine     Upadacitinib  ER (RINVOQ ) 15 MG TB24 Take 1 tablet (15 mg total) by mouth daily. 30 tablet 2   valACYclovir  (VALTREX ) 500 MG tablet Take 1 tablet (500 mg total) by mouth daily as needed. Increase to bid x 3 days with any symptoms.     Magnesium Citrate (MAGNESIUM GUMMIES PO) Take  by mouth daily. (Patient not taking: Reported on 12/08/2024)     No current facility-administered medications on file prior to visit.   "

## 2024-12-08 NOTE — Assessment & Plan Note (Addendum)
 Exam today representative.   Rapid COVID-19 infection negative. Rapid influenza test negative today.  Rapid strep test positive.  Start amoxicillin antibiotics for the infection. Take 1 tablet by mouth twice daily for 10 days. Start Benzonatate capsules for cough. Take 1 capsule by mouth three times daily as needed for cough. Work note provided.   We also discussed conservative treatment OTC.

## 2024-12-13 ENCOUNTER — Encounter: Payer: Self-pay | Admitting: Oncology

## 2024-12-13 ENCOUNTER — Ambulatory Visit
Admission: RE | Admit: 2024-12-13 | Discharge: 2024-12-13 | Disposition: A | Discharge status: Home / Self Care | Source: Ambulatory Visit | Attending: Obstetrics & Gynecology | Admitting: Obstetrics & Gynecology

## 2024-12-13 DIAGNOSIS — Z1231 Encounter for screening mammogram for malignant neoplasm of breast: Secondary | ICD-10-CM

## 2024-12-15 ENCOUNTER — Other Ambulatory Visit: Payer: Self-pay | Admitting: Internal Medicine

## 2024-12-15 DIAGNOSIS — M1380 Other specified arthritis, unspecified site: Secondary | ICD-10-CM

## 2024-12-15 NOTE — Telephone Encounter (Signed)
 Last Fill: 10/03/2024  Labs: 10/03/2024 Glucose 106  Next Visit: 12/29/2024  Last Visit: 10/03/2024  DX: Psoriatic arthritis   Current Dose per office note 10/03/2024: Celebrex  to 200 mg twice daily   Okay to refill Celebrex ?
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
## 2024-12-29 ENCOUNTER — Encounter: Payer: Self-pay | Admitting: Internal Medicine

## 2024-12-29 ENCOUNTER — Ambulatory Visit: Attending: Internal Medicine | Admitting: Internal Medicine

## 2024-12-29 VITALS — BP 137/81 | HR 74 | Temp 97.5°F | Resp 15 | Ht 71.0 in | Wt 289.0 lb

## 2024-12-29 DIAGNOSIS — Z79899 Other long term (current) drug therapy: Secondary | ICD-10-CM

## 2024-12-29 DIAGNOSIS — M1380 Other specified arthritis, unspecified site: Secondary | ICD-10-CM

## 2024-12-29 DIAGNOSIS — L409 Psoriasis, unspecified: Secondary | ICD-10-CM

## 2024-12-29 DIAGNOSIS — K760 Fatty (change of) liver, not elsewhere classified: Secondary | ICD-10-CM | POA: Diagnosis not present

## 2024-12-29 DIAGNOSIS — L4050 Arthropathic psoriasis, unspecified: Secondary | ICD-10-CM

## 2024-12-29 DIAGNOSIS — E785 Hyperlipidemia, unspecified: Secondary | ICD-10-CM

## 2024-12-29 MED ORDER — RINVOQ 15 MG PO TB24: 15.0000 mg | ORAL_TABLET | Freq: Every day | ORAL | 2 refills | Status: AC

## 2024-12-29 MED ORDER — CELECOXIB 200 MG PO CAPS: 200.0000 mg | ORAL_CAPSULE | Freq: Every day | ORAL | 1 refills | Status: AC

## 2024-12-30 LAB — CBC WITH DIFFERENTIAL/PLATELET
Absolute Lymphocytes: 2053 {cells}/uL (ref 850–3900)
Absolute Monocytes: 557 {cells}/uL (ref 200–950)
Basophils Absolute: 29 {cells}/uL (ref 0–200)
Basophils Relative: 0.5 %
Eosinophils Absolute: 110 {cells}/uL (ref 15–500)
Eosinophils Relative: 1.9 %
HCT: 40.8 % (ref 35.9–46.0)
Hemoglobin: 13.1 g/dL (ref 11.7–15.5)
MCH: 28.6 pg (ref 27.0–33.0)
MCHC: 32.1 g/dL (ref 31.6–35.4)
MCV: 89.1 fL (ref 81.4–101.7)
MPV: 10.2 fL (ref 7.5–12.5)
Monocytes Relative: 9.6 %
Neutro Abs: 3051 {cells}/uL (ref 1500–7800)
Neutrophils Relative %: 52.6 %
Platelets: 364 Thousand/uL (ref 140–400)
RBC: 4.58 Million/uL (ref 3.80–5.10)
RDW: 12.5 % (ref 11.0–15.0)
Total Lymphocyte: 35.4 %
WBC: 5.8 Thousand/uL (ref 3.8–10.8)

## 2024-12-30 LAB — COMPREHENSIVE METABOLIC PANEL WITH GFR
AG Ratio: 1.6 (calc) (ref 1.0–2.5)
ALT: 21 U/L (ref 6–29)
AST: 19 U/L (ref 10–35)
Albumin: 4.2 g/dL (ref 3.6–5.1)
Alkaline phosphatase (APISO): 55 U/L (ref 37–153)
BUN: 13 mg/dL (ref 7–25)
CO2: 25 mmol/L (ref 20–32)
Calcium: 9.9 mg/dL (ref 8.6–10.4)
Chloride: 106 mmol/L (ref 98–110)
Creat: 0.61 mg/dL (ref 0.50–1.03)
Globulin: 2.6 g/dL (ref 1.9–3.7)
Glucose, Bld: 98 mg/dL (ref 65–99)
Potassium: 4.6 mmol/L (ref 3.5–5.3)
Sodium: 140 mmol/L (ref 135–146)
Total Bilirubin: 0.7 mg/dL (ref 0.2–1.2)
Total Protein: 6.8 g/dL (ref 6.1–8.1)
eGFR: 108 mL/min/1.73m2

## 2024-12-30 LAB — LIPID PANEL
Cholesterol: 173 mg/dL
HDL: 58 mg/dL
LDL Cholesterol (Calc): 98 mg/dL
Non-HDL Cholesterol (Calc): 115 mg/dL
Total CHOL/HDL Ratio: 3 (calc)
Triglycerides: 83 mg/dL

## 2024-12-30 LAB — SEDIMENTATION RATE: Sed Rate: 9 mm/h (ref 0–30)

## 2025-01-06 ENCOUNTER — Ambulatory Visit: Payer: Self-pay | Admitting: Internal Medicine

## 2025-02-21 ENCOUNTER — Ambulatory Visit: Admitting: Internal Medicine

## 2025-04-03 ENCOUNTER — Ambulatory Visit: Admitting: Internal Medicine

## 2025-06-15 ENCOUNTER — Ambulatory Visit (HOSPITAL_BASED_OUTPATIENT_CLINIC_OR_DEPARTMENT_OTHER): Admitting: Obstetrics & Gynecology
